# Patient Record
Sex: Male | Born: 1945
Health system: Southern US, Community
[De-identification: ages and names within clinical notes are randomized; demographics above are authoritative.]

## PROBLEM LIST (undated history)

## (undated) DIAGNOSIS — E785 Hyperlipidemia, unspecified: Secondary | ICD-10-CM

## (undated) DIAGNOSIS — Z961 Presence of intraocular lens: Secondary | ICD-10-CM

## (undated) DIAGNOSIS — H8109 Meniere's disease, unspecified ear: Secondary | ICD-10-CM

## (undated) DIAGNOSIS — I719 Aortic aneurysm of unspecified site, without rupture: Secondary | ICD-10-CM

## (undated) DIAGNOSIS — I4892 Unspecified atrial flutter: Secondary | ICD-10-CM

## (undated) DIAGNOSIS — H269 Unspecified cataract: Secondary | ICD-10-CM

## (undated) DIAGNOSIS — G4730 Sleep apnea, unspecified: Secondary | ICD-10-CM

## (undated) DIAGNOSIS — J302 Other seasonal allergic rhinitis: Secondary | ICD-10-CM

## (undated) DIAGNOSIS — C4491 Basal cell carcinoma of skin, unspecified: Secondary | ICD-10-CM

## (undated) HISTORY — PX: COLONOSCOPY: SHX174

## (undated) HISTORY — DX: Unspecified cataract: H26.9

## (undated) HISTORY — DX: Sleep apnea, unspecified: G47.30

## (undated) HISTORY — DX: Other seasonal allergic rhinitis: J30.2

## (undated) HISTORY — DX: Hyperlipidemia, unspecified: E78.5

## (undated) HISTORY — PX: TOOTH EXTRACTION: SUR596

## (undated) HISTORY — DX: Presence of intraocular lens: Z96.1

## (undated) HISTORY — DX: Unspecified atrial flutter: I48.92

## (undated) HISTORY — DX: Basal cell carcinoma of skin, unspecified: C44.91

## (undated) HISTORY — PX: CATARACT EXTRACTION: SUR2

## (undated) HISTORY — DX: Meniere's disease, unspecified ear: H81.09

## (undated) HISTORY — DX: Aortic aneurysm of unspecified site, without rupture: I71.9

## (undated) HISTORY — PX: OTHER SURGICAL HISTORY: SHX169

---

## 1988-09-04 HISTORY — PX: OTHER SURGICAL HISTORY: SHX169

## 1998-09-04 HISTORY — PX: OTHER SURGICAL HISTORY: SHX169

## 2006-04-16 ENCOUNTER — Ambulatory Visit: Payer: Self-pay | Admitting: Family Medicine

## 2006-04-24 ENCOUNTER — Ambulatory Visit: Payer: Self-pay | Admitting: Family Medicine

## 2006-04-25 ENCOUNTER — Ambulatory Visit: Payer: Self-pay | Admitting: Family Medicine

## 2006-10-22 ENCOUNTER — Ambulatory Visit: Payer: Self-pay | Admitting: Family Medicine

## 2006-10-22 LAB — CONVERTED CEMR LAB
Cholesterol: 259 mg/dL (ref 0–200)
HDL: 56.7 mg/dL (ref >39.0)
Total CHOL/HDL Ratio: 4.6
Triglycerides: 147 mg/dL (ref 0–149)
VLDL: 29 mg/dL (ref 0–40)

## 2008-01-09 ENCOUNTER — Ambulatory Visit: Payer: Self-pay | Admitting: Family Medicine

## 2008-01-09 DIAGNOSIS — E7800 Pure hypercholesterolemia, unspecified: Secondary | ICD-10-CM

## 2008-01-10 LAB — CONVERTED CEMR LAB
HDL: 50.6 mg/dL (ref >39.0)
LDL Cholesterol: 107 mg/dL — ABNORMAL HIGH (ref 0–99)
Total CHOL/HDL Ratio: 3.8
VLDL: 34 mg/dL (ref 0–40)

## 2008-04-08 ENCOUNTER — Ambulatory Visit: Payer: Self-pay | Admitting: Family Medicine

## 2008-04-08 DIAGNOSIS — J309 Allergic rhinitis, unspecified: Secondary | ICD-10-CM | POA: Insufficient documentation

## 2008-04-10 ENCOUNTER — Ambulatory Visit: Payer: Self-pay | Admitting: Cardiology

## 2008-04-10 LAB — CONVERTED CEMR LAB
ALT: 66 units/L — ABNORMAL HIGH (ref 0–53)
AST: 75 units/L — ABNORMAL HIGH (ref 0–37)
Albumin: 4.3 g/dL (ref 3.5–5.2)
Alkaline Phosphatase: 52 units/L (ref 39–117)
BUN: 13 mg/dL (ref 6–23)
Basophils Absolute: 0 10*3/uL (ref 0.0–0.1)
Basophils Relative: 0.2 % (ref 0.0–3.0)
Bilirubin, Direct: 0.1 mg/dL (ref 0.0–0.3)
CO2: 28 meq/L (ref 19–32)
Calcium: 9.1 mg/dL (ref 8.4–10.5)
Chloride: 101 meq/L (ref 96–112)
Cholesterol: 206 mg/dL (ref 0–200)
Creatinine, Ser: 0.8 mg/dL (ref 0.4–1.5)
Direct LDL: 122.2 mg/dL
Eosinophils Absolute: 0.3 10*3/uL (ref 0.0–0.7)
Eosinophils Relative: 4.1 % (ref 0.0–5.0)
GFR calc Af Amer: 127 mL/min
GFR calc non Af Amer: 105 mL/min
Glucose, Bld: 95 mg/dL (ref 70–99)
HCT: 46 % (ref 39.0–52.0)
HDL: 64.9 mg/dL (ref >39.0)
Hemoglobin: 16.2 g/dL (ref 13.0–17.0)
Lymphocytes Relative: 28.6 % (ref 12.0–46.0)
MCHC: 35.2 g/dL (ref 30.0–36.0)
MCV: 94.4 fL (ref 78.0–100.0)
Monocytes Absolute: 0.7 10*3/uL (ref 0.1–1.0)
Monocytes Relative: 8.4 % (ref 3.0–12.0)
Neutro Abs: 4.9 10*3/uL (ref 1.4–7.7)
Neutrophils Relative %: 58.7 % (ref 43.0–77.0)
PSA: 1.84 ng/mL (ref 0.10–4.00)
Platelets: 388 10*3/uL (ref 150–400)
Potassium: 4.1 meq/L (ref 3.5–5.1)
RBC: 4.87 M/uL (ref 4.22–5.81)
RDW: 12.2 % (ref 11.5–14.6)
Sodium: 139 meq/L (ref 135–145)
TSH: 0.99 microintl units/mL (ref 0.35–5.50)
Total Bilirubin: 1.1 mg/dL (ref 0.3–1.2)
Total CHOL/HDL Ratio: 3.2
Total Protein: 7 g/dL (ref 6.0–8.3)
Triglycerides: 153 mg/dL — ABNORMAL HIGH (ref 0–149)
VLDL: 31 mg/dL (ref 0–40)
WBC: 8.2 10*3/uL (ref 4.5–10.5)

## 2008-04-17 ENCOUNTER — Encounter: Payer: Self-pay | Admitting: Family Medicine

## 2008-04-17 ENCOUNTER — Ambulatory Visit: Payer: Self-pay

## 2008-04-27 ENCOUNTER — Ambulatory Visit: Payer: Self-pay | Admitting: Family Medicine

## 2008-04-29 LAB — CONVERTED CEMR LAB
Albumin: 3.9 g/dL (ref 3.5–5.2)
Alkaline Phosphatase: 37 units/L — ABNORMAL LOW (ref 39–117)
Total Bilirubin: 0.8 mg/dL (ref 0.3–1.2)

## 2008-07-01 ENCOUNTER — Ambulatory Visit: Payer: Self-pay | Admitting: Family Medicine

## 2008-07-02 LAB — CONVERTED CEMR LAB
Albumin: 3.9 g/dL (ref 3.5–5.2)
Cholesterol: 266 mg/dL (ref 0–200)
Direct LDL: 165.5 mg/dL
HDL: 54.6 mg/dL (ref >39.0)
Total Protein: 6.9 g/dL (ref 6.0–8.3)
VLDL: 30 mg/dL (ref 0–40)

## 2008-08-14 ENCOUNTER — Ambulatory Visit: Payer: Self-pay | Admitting: Family Medicine

## 2008-08-18 LAB — CONVERTED CEMR LAB
Cholesterol: 267 mg/dL (ref 0–200)
Triglycerides: 123 mg/dL (ref 0–149)

## 2008-10-29 ENCOUNTER — Ambulatory Visit: Payer: Self-pay | Admitting: Family Medicine

## 2009-10-19 ENCOUNTER — Ambulatory Visit: Payer: Self-pay | Admitting: Family Medicine

## 2009-10-25 ENCOUNTER — Encounter: Payer: Self-pay | Admitting: Family Medicine

## 2009-10-25 LAB — CONVERTED CEMR LAB
Alkaline Phosphatase: 49 units/L (ref 39–117)
Basophils Relative: 0.8 % (ref 0.0–3.0)
Bilirubin, Direct: 0.1 mg/dL (ref 0.0–0.3)
CO2: 30 meq/L (ref 19–32)
Calcium: 8.9 mg/dL (ref 8.4–10.5)
Cholesterol: 220 mg/dL — ABNORMAL HIGH (ref 0–200)
Creatinine, Ser: 0.9 mg/dL (ref 0.4–1.5)
Direct LDL: 142.7 mg/dL
Eosinophils Absolute: 0.3 10*3/uL (ref 0.0–0.7)
HDL: 53.6 mg/dL (ref >39.00)
Lymphocytes Relative: 27.8 % (ref 12.0–46.0)
MCHC: 33.1 g/dL (ref 30.0–36.0)
Neutrophils Relative %: 57.6 % (ref 43.0–77.0)
RBC: 5.24 M/uL (ref 4.22–5.81)
Total CHOL/HDL Ratio: 4
Total Protein: 6.8 g/dL (ref 6.0–8.3)
Triglycerides: 171 mg/dL — ABNORMAL HIGH (ref 0.0–149.0)
VLDL: 34.2 mg/dL (ref 0.0–40.0)
WBC: 7.1 10*3/uL (ref 4.5–10.5)

## 2009-11-30 ENCOUNTER — Ambulatory Visit: Payer: Self-pay | Admitting: Family Medicine

## 2009-12-01 LAB — CONVERTED CEMR LAB
ALT: 29 units/L (ref 0–53)
Cholesterol: 191 mg/dL (ref 0–200)
HDL: 52.5 mg/dL (ref >39.00)
Triglycerides: 108 mg/dL (ref 0.0–149.0)
VLDL: 21.6 mg/dL (ref 0.0–40.0)

## 2010-08-19 ENCOUNTER — Ambulatory Visit: Payer: Self-pay | Admitting: Family Medicine

## 2010-10-04 NOTE — Miscellaneous (Signed)
Summary: Pravachol 80mg  rx  Clinical Lists Changes  Medications: Added new medication of PRAVACHOL 80 MG TABS (PRAVASTATIN SODIUM) take one by mouth once daily Removed medication of PRAVACHOL 40 MG TABS (PRAVASTATIN SODIUM) one by mouth daily     Prior Medications: BAYER LOW STRENGTH 81 MG  TBEC (ASPIRIN) take one by mouth daily MULTIVITAMINS   TABS (MULTIPLE VITAMIN) daily OSTEO BI-FLEX JOINT SHIELD   TABS (MISC NATURAL PRODUCTS) daily FISH OIL 1000 MG  CAPS (OMEGA-3 FATTY ACIDS) daily VITAMIN C () daily CALCIUM () daily VITAMIN D 2600 IU () daily CLARITIN 10 MG () 1 by mouth once daily as needed MUCINEX DM MAXIMUM STRENGTH 60-1200 MG XR12H-TAB (DEXTROMETHORPHAN-GUAIFENESIN) OTC As directed. PRAVACHOL 80 MG TABS (PRAVASTATIN SODIUM) take one by mouth once daily Current Allergies: No known allergies

## 2010-10-04 NOTE — Assessment & Plan Note (Signed)
Summary: CPX/RBH   Vital Signs:  Patient profile:   65 year old male Height:      70 inches Weight:      196.25 pounds BMI:     28.26 Temp:     97.9 degrees F oral Pulse rate:   76 / minute Pulse rhythm:   regular BP sitting:   120 / 82  (left arm) Cuff size:   regular  Vitals Entered By: Lewanda Rife LPN (October 19, 2009 9:36 AM)  History of Present Illness: here for health mt exam and to rev chronic med problems  is getting over a bad cold-- still some drip / and dry cough   bp is good 120/82  wt is stable  is exercising - but not as much as he was / walking dogs  has a gym membership- is getting back to that eats a very healthy diet - low fat  lipids are due to check on statin ate oats and nuts yogurt -- raisins  diet - grains and veg and nuts and fruits   prostate no problems - no change in stream / does go frequently when he drinks a lot of fluids  no nocturia  sleeps very well   Td 02 shingles status -- never had it --may consider it later  had his flu shot  not interested in pneumovax yet  does not want colonoscopy  will do stool card   Allergies (verified): No Known Drug Allergies  Past History:  Past Medical History: Last updated: 04/22/2008 hyperlipidemia  allergic rhinitis labile blood pressure   cardiology-- Dr Daleen Squibb  Past Surgical History: Last updated: 04/22/2008 multiple hernia sx 2000- ER for chest pain --? 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 Eyes:  Denies blurring and eye irritation. ENT:  Complains of decreased hearing and ringing in ears;  denies sinus pressure; is musician exp to loud noise . CV:  Denies chest pain or discomfort and palpitations. Resp:  Denies cough and wheezing. GI:  Denies abdominal pain, bloody stools, change in bowel habits, and indigestion. MS:  occ low back pain- not now .  Physical Exam  General:  Well-developed,well-nourished,in no acute distress; alert,appropriate and cooperative throughout examination Head:  normocephalic, atraumatic, and no abnormalities observed.   Eyes:  vision grossly intact, pupils equal, pupils round, and pupils reactive to light.   Ears:  R ear normal and L ear normal.   Nose:  no nasal discharge.   Mouth:  pharynx pink and moist.   Neck:  supple with full rom and no masses or thyromegally, no JVD or carotid bruit  Chest Wall:  No deformities, masses, tenderness or gynecomastia noted. Lungs:  Normal respiratory effort, chest expands symmetrically. Lungs are clear to auscultation, no crackles or wheezes. Heart:  Normal rate and regular rhythm. S1 and S2 normal without gallop, murmur, click, rub or other extra sounds. Abdomen:  Bowel sounds positive,abdomen soft and non-tender without masses, organomegaly or hernias noted. no renal bruits  Rectal:  No external abnormalities noted. Normal sphincter tone. No rectal masses or tenderness. Prostate:  Prostate gland firm and  smooth, no enlargement, nodularity, tenderness, mass, asymmetry or induration. Msk:  No deformity or scoliosis noted of thoracic or lumbar spine.  no acute joint changes  Pulses:  R and L carotid,radial,femoral,dorsalis pedis and posterior tibial pulses are full and equal bilaterally Extremities:  No clubbing, cyanosis, edema, or deformity noted with normal full range of motion of all joints.   Neurologic:  sensation intact to light touch, gait normal, and DTRs symmetrical and normal.   Skin:  Intact without suspicious lesions or rashes Cervical Nodes:  No lymphadenopathy noted Inguinal Nodes:  No significant  adenopathy Psych:  normal affect, talkative and pleasant    Impression & Recommendations:  Problem # 1:  HEALTH MAINTENANCE EXAM (ICD-V70.0) Assessment Unchanged reviewed health habits including diet, exercise and skin cancer prevention reviewed health maintenance list and family history commended on good heatlh habits needs to add regular exercise  lab today Orders: Venipuncture (45409) TLB-Lipid Panel (80061-LIPID) TLB-BMP (Basic Metabolic Panel-BMET) (80048-METABOL) TLB-CBC Platelet - w/Differential (85025-CBCD) TLB-Hepatic/Liver Function Pnl (80076-HEPATIC) TLB-TSH (Thyroid Stimulating Hormone) (84443-TSH)  Problem # 2:  SPECIAL SCREENING MALIGNANT NEOPLASM OF PROSTATE (ICD-V76.44) Assessment: Comment Only DRE without change - no symptom  psa today and update Orders: Venipuncture (81191) TLB-PSA (Prostate Specific Antigen) (84153-PSA)  Problem # 3:  HYPERCHOLESTEROLEMIA (ICD-272.0) Assessment: Unchanged  control has been good with statin and diet rev low sat fat diet  lab today and update  His updated medication list for this problem includes:    Pravachol 40 Mg Tabs (Pravastatin sodium) ..... One by mouth daily  Labs Reviewed: SGOT: 27 (08/14/2008)   SGPT: 33 (08/14/2008)   HDL:65.7 (08/14/2008), 54.6 (07/01/2008)  LDL:DEL (08/14/2008), DEL (07/01/2008)  Chol:267 (08/14/2008), 266 (07/01/2008)  Trig:123 (08/14/2008), 148 (07/01/2008)  Orders: Prescription Created Electronically [phone]  Complete Medication List: 1)  Pravachol 40 Mg Tabs (Pravastatin sodium) .... One by mouth daily 2)  Bayer Low Strength 81 Mg Tbec (Aspirin) .... Take one by mouth daily 3)  Multivitamins Tabs (Multiple vitamin) .... Daily 4)  Osteo Bi-flex Joint Shield Tabs (Misc natural products) .... Daily 5)  Fish Oil 1000 Mg Caps (Omega-3 fatty acids) .... Daily 6)  Vitamin C  .... Daily 7)  Calcium  .... Daily 8)  Vitamin D 2600 Iu  .... Daily 9)  Claritin 10 Mg  .Marland KitchenMarland Kitchen. 1 by mouth once  daily as needed 10)  Mucinex Dm Maximum Strength 60-1200 Mg Xr12h-tab (Dextromethorphan-guaifenesin) .... Otc as directed.  Patient Instructions: 1)  if you are interested in a shingles vaccine in the future --- please check with your insurance about coverage and call us in the summer to schedule  2)  keep working on healthy low fat diet 3)  It is important that you exercise reguarly at least 20 minutes 5 times a week. If you develop chest pain, have severe difficulty breathing, or feel very tired, stop exercising immediately and seek medical attention.  4)  if all labs are ok -- can follow up for physical in one year  5)  please do stool card for colon cancer screening  Prescriptions: PRAVACHOL 40 MG TABS (PRAVASTATIN SODIUM) one by mouth daily  #30 x 11   Entered and Authorized by:   Judith Part MD   Signed by:   Judith Part MD on 10/19/2009   Method used:   [facility]        [facility] [phone]* (retail)       [address] [place]  [place], [place]  [address]       Ph: [phone] or [phone]       Fax: [phone]   RxID:   [id number]   Current Allergies (reviewed today): No known allergies      Influenza Immunization History:    Influenza # 1:  Fluvax 3+ (09/04/2009)

## 2010-10-06 NOTE — Assessment & Plan Note (Signed)
Summary: FLU SHOT  CYD  Nurse Visit   Allergies: No Known Drug Allergies  Orders Added: 1)  Admin 1st Vaccine [90471] 2)  Flu Vaccine 3yrs + [90658]   Flu Vaccine Consent Questions     Do you have a history of severe allergic reactions to this vaccine? no    Any prior history of allergic reactions to egg and/or gelatin? no    Do you have a sensitivity to the preservative Thimersol? no    Do you have a past history of Guillan-Barre Syndrome? no    Do you currently have an acute febrile illness? no    Have you ever had a severe reaction to latex? no    Vaccine information given and explained to patient? yes    Are you currently pregnant? no    Lot Number:[id number]   Exp Date:03/04/2011   Site Given  Left Deltoid IM 

## 2010-10-28 ENCOUNTER — Telehealth (INDEPENDENT_AMBULATORY_CARE_PROVIDER_SITE_OTHER): Payer: Self-pay | Admitting: *Deleted

## 2010-10-31 ENCOUNTER — Encounter (INDEPENDENT_AMBULATORY_CARE_PROVIDER_SITE_OTHER): Payer: Self-pay | Admitting: *Deleted

## 2010-10-31 ENCOUNTER — Other Ambulatory Visit: Payer: Self-pay | Admitting: Family Medicine

## 2010-10-31 ENCOUNTER — Other Ambulatory Visit (INDEPENDENT_AMBULATORY_CARE_PROVIDER_SITE_OTHER): Payer: PRIVATE HEALTH INSURANCE

## 2010-10-31 DIAGNOSIS — E7800 Pure hypercholesterolemia, unspecified: Secondary | ICD-10-CM

## 2010-10-31 DIAGNOSIS — Z125 Encounter for screening for malignant neoplasm of prostate: Secondary | ICD-10-CM

## 2010-10-31 DIAGNOSIS — Z0000 Encounter for general adult medical examination without abnormal findings: Secondary | ICD-10-CM

## 2010-10-31 LAB — CBC WITH DIFFERENTIAL/PLATELET
Basophils Absolute: 0 10*3/uL (ref 0.0–0.1)
HCT: 43.5 % (ref 39.0–52.0)
Hemoglobin: 15 g/dL (ref 13.0–17.0)
Lymphs Abs: 2 10*3/uL (ref 0.7–4.0)
MCV: 93.9 fl (ref 78.0–100.0)
Monocytes Relative: 8.7 % (ref 3.0–12.0)
Neutro Abs: 3.9 10*3/uL (ref 1.4–7.7)
RDW: 13.3 % (ref 11.5–14.6)

## 2010-10-31 LAB — HEPATIC FUNCTION PANEL
ALT: 38 U/L (ref 0–53)
AST: 32 U/L (ref 0–37)
Albumin: 3.9 g/dL (ref 3.5–5.2)

## 2010-10-31 LAB — TSH: TSH: 0.72 u[IU]/mL (ref 0.35–5.50)

## 2010-10-31 LAB — BASIC METABOLIC PANEL
CO2: 26 mEq/L (ref 19–32)
Chloride: 107 mEq/L (ref 96–112)
GFR: 105.43 mL/min (ref >60.00)
Glucose, Bld: 101 mg/dL — ABNORMAL HIGH (ref 70–99)
Potassium: 4.2 mEq/L (ref 3.5–5.1)
Sodium: 140 mEq/L (ref 135–145)

## 2010-10-31 LAB — LIPID PANEL: VLDL: 23.4 mg/dL (ref 0.0–40.0)

## 2010-11-01 NOTE — Progress Notes (Signed)
----   Converted from flag ---- ---- 10/27/2010 8:06 PM, Judith Part MD wrote: please check lipid/wellness and psa for v70.0 and prostate  screen  ---- 10/25/2010 12:58 PM, Liane Comber CMA (AAMA) wrote: Lab orders please! Good Morning! This pt is scheduled for cpx labs Monday, which labs to draw and dx codes to use? Thanks Tasha ------------------------------

## 2010-11-02 ENCOUNTER — Encounter (INDEPENDENT_AMBULATORY_CARE_PROVIDER_SITE_OTHER): Payer: PRIVATE HEALTH INSURANCE | Admitting: Family Medicine

## 2010-11-02 ENCOUNTER — Encounter: Payer: Self-pay | Admitting: Family Medicine

## 2010-11-02 DIAGNOSIS — Z23 Encounter for immunization: Secondary | ICD-10-CM

## 2010-11-02 DIAGNOSIS — Z0000 Encounter for general adult medical examination without abnormal findings: Secondary | ICD-10-CM

## 2010-11-02 DIAGNOSIS — E7800 Pure hypercholesterolemia, unspecified: Secondary | ICD-10-CM

## 2010-11-02 DIAGNOSIS — Z125 Encounter for screening for malignant neoplasm of prostate: Secondary | ICD-10-CM

## 2010-11-10 NOTE — Letter (Signed)
Summary: [facility]: Immunoassay Fecal Occult Blood (iFOB) Order Form  [place] [facility]  [address] [place], [place] [address]   Phone: [phone]  Fax: [phone]      [facility]: Immunoassay Fecal Occult Blood (iFOB) Order Form   November 02, 2010 MRN: [id number]   Marvin Pacheco Apr 21, 1946   Physicican Name:___________tower______________  Diagnosis Code:___________V76.51_______________      Judith Part MD

## 2010-11-10 NOTE — Assessment & Plan Note (Signed)
Summary: CPX/RBH   Vital Signs:  Patient profile:   65 year old male Height:      70.5 inches Weight:      195.50 pounds BMI:     27.75 Temp:     98.2 degrees F oral Pulse rate:   76 / minute Pulse rhythm:   regular BP sitting:   116 / 78  (left arm) Cuff size:   regular  Vitals Entered By: Lewanda Rife LPN (November 02, 2010 10:16 AM) CC: CPX   History of Present Illness: here for health mt exam and to review chronic med problems  has been feeling fine  no new medical changes    wt is down 1 lb with bmi of 27  116/78 good bp  chol is stable on statin and diet  Last Lipid ProfileCholesterol: 196 (10/31/2010 8:35:07 AM)HDL:  56.10 (10/31/2010 8:35:07 AM)LDL:  117 (10/31/2010 8:35:07 AM)Triglycerides:  Last Liver profileSGOT:  32 (10/31/2010 8:35:07 AM)SPGT:  38 (10/31/2010 8:35:07 AM)T. Bili:  1.0 (10/31/2010 8:35:07 AM)Alk Phos:  48 (10/31/2010 8:35:07 AM) diet continues to be good for the most part occ cheats - tries not to  eating a bit more sugar- then stopped that   is not exercising as much as he needs to  can make some changes -- needs to walk his    psa is 1.95- stable as well no prostate symptoms at all no nucturia   Td is due - last 1/02 found out daughter is pregnant-- will get pertussis imm other imms   does not want colonosc  will do stool card  due for Td  also wants shingles shot- he checked with is insurance    Allergies (verified): No Known Drug Allergies  Past History:  Past Medical History: Last updated: 04/22/2008 hyperlipidemia  allergic rhinitis labile blood pressure   cardiology-- Dr Daleen Squibb  Past Surgical History: Last updated: 04/22/2008 multiple hernia sx 2000- ER for chest pain --? 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 Eyes:  Denies blurring and eye irritation. CV:  Denies chest pain or discomfort, palpitations, and shortness of breath with exertion. Resp:  Denies cough, shortness of breath, and wheezing. GI:  Denies abdominal pain, change in bowel habits, indigestion, and nausea. GU:  Denies dysuria, nocturia, urinary frequency, and urinary hesitancy. MS:  Denies joint pain, muscle aches, and cramps. Derm:  Denies itching, lesion(s), poor wound healing, and rash. Neuro:  Denies numbness and tingling. Endo:  Denies cold intolerance, excessive thirst, excessive urination, and heat intolerance. Heme:  Denies abnormal bruising and bleeding.  Physical Exam  General:  Well-developed,well-nourished,in no acute distress; alert,appropriate and cooperative throughout examination Head:  normocephalic, atraumatic, and no abnormalities observed.   Eyes:  vision grossly intact, pupils equal, pupils round, and pupils reactive to light.  no conjunctival pallor, injection or icterus  Ears:  R ear normal and L ear normal.   Nose:  no nasal discharge.   Mouth:  pharynx pink and moist.   Neck:  supple with full rom and no masses or thyromegally, no JVD or carotid bruit  Chest Wall:  No deformities,  masses, tenderness or gynecomastia noted. Lungs:  Normal respiratory effort, chest expands symmetrically. Lungs are clear to auscultation, no crackles or wheezes. Heart:  Normal rate and regular rhythm. S1 and S2 normal without gallop, murmur, click, rub or other extra sounds. Abdomen:  Bowel sounds positive,abdomen soft and non-tender without masses, organomegaly or hernias noted. no renal bruits  Rectal:  No external abnormalities noted. Normal sphincter tone. No rectal masses or tenderness. Prostate:  Prostate gland firm and smooth, no  enlargement, nodularity, tenderness, mass, asymmetry or induration. Msk:  medial bunion on L foot  no other acute joint changes no kyphosis  Pulses:  R and L carotid,radial,femoral,dorsalis pedis and posterior tibial pulses are full and equal bilaterally Extremities:  No clubbing, cyanosis, edema, or deformity noted with normal full range of motion of all joints.   Neurologic:  sensation intact to light touch, gait normal, and DTRs symmetrical and normal.   Skin:  thickened toenails bilat  no rash some lentigos  Cervical Nodes:  No lymphadenopathy noted Inguinal Nodes:  No significant adenopathy Psych:  normal affect, talkative and pleasant    Impression & Recommendations:  Problem # 1:  HEALTH MAINTENANCE EXAM (ICD-V70.0) Assessment Comment Only reviewed health habits including diet, exercise and skin cancer prevention reviewed health maintenance list and family history reviewed wellness labs with pt in detail  tdap today sched shingles vaccine in 1 mo   Problem # 2:  HYPERCHOLESTEROLEMIA (ICD-272.0) Assessment: Unchanged  this is stable with max dose of pravastatin rev lab with pt  rev low sat fat diet with pt   His updated medication list for this problem includes:    Pravachol 80 Mg Tabs (Pravastatin sodium) .Marland Kitchen... Take one by mouth once daily  Labs Reviewed: SGOT: 32 (10/31/2010)   SGPT: 38 (10/31/2010)   HDL:56.10 (10/31/2010), 52.50 (11/30/2009)  LDL:117 (10/31/2010), 117 (11/30/2009)  Chol:196 (10/31/2010), 191 (11/30/2009)  Trig:117.0 (10/31/2010), 108.0 (11/30/2009)  Problem # 3:  SPECIAL SCREENING MALIGNANT NEOPLASM OF PROSTATE (ICD-V76.44) Assessment: Comment Only psa is stable  no symptoms  nonremarkable exam   Complete Medication List: 1)  Bayer Low Strength 81 Mg Tbec (Aspirin) .... Take one by mouth daily 2)  Multivitamins Tabs (Multiple vitamin) .... Daily 3)  Fish Oil 1000 Mg Caps (Omega-3 fatty acids) .... Daily 4)  Calcium ? Mg  .... Two tabs by  mouth daily 5)  Vitamin D 2600 Iu  .... Daily 6)  Claritin 10 Mg  .Marland KitchenMarland Kitchen. 1 by mouth once daily as needed 7)  Mucinex Dm Maximum Strength 60-1200 Mg Xr12h-tab (Dextromethorphan-guaifenesin) .... Otc as directed. 8)  Pravachol 80 Mg Tabs (Pravastatin sodium) .... Take one by mouth once daily 9)  Vitamin C 1000 Mg Tabs (Ascorbic acid) .... Take 1 tablet by mouth once a day 10)  Coq10 ?mg  .... Take 1 tablet by mouth once a day 11)  B Complex Tabs (B complex vitamins) .... Take 1 tablet by mouth once a day  Other Orders: Tdap => 17yrs IM (37106) Admin 1st Vaccine (26948)  Patient Instructions: 1)  you can raise your HDL (good cholesterol) by increasing exercise and eating omega 3 fatty acid supplement like fish oil or flax seed oil over the counter 2)  you can lower LDL (bad cholesterol) by limiting saturated fats in diet like red meat, fried foods, egg yolks, fatty breakfast meats, high fat dairy products and shellfish  3)  no change in medicines 4)  labs look stable  5)  tetnus shot today  6)  schedule nurse visit for shingles vaccine in 1 month Prescriptions: PRAVACHOL 80 MG TABS (PRAVASTATIN SODIUM) take one by mouth once daily  #30 x 11   Entered and Authorized by:   Judith Part MD   Signed by:   Judith Part MD on 11/02/2010   Method used:   [facility]        [facility] [phone]* (retail)       [address] [place], [place]  [address]       Ph: [phone] or [phone]       Fax: [phone]   RxID:   [id number]    Orders Added: 1)  Tdap => 65yrs IM [90715] 2)  Admin 1st Vaccine [90471] 3)  Est. Patient 40-64 years [99396]   Immunizations Administered:  Tetanus Vaccine:    Vaccine Type: Tdap    Site: left deltoid    Mfr: GlaxoSmithKline    Dose: 0.5 ml    Route: IM    Given by: Lewanda Rife LPN    Exp. Date: 06/23/2012    Lot #: [id number]    VIS given: 07/22/08 version given November 02, 2010.   Immunizations  Administered:  Tetanus Vaccine:    Vaccine Type: Tdap    Site: left deltoid    Mfr: GlaxoSmithKline    Dose: 0.5 ml    Route: IM    Given by: Lewanda Rife LPN    Exp. Date: 06/23/2012    Lot #: [id number]    VIS given: 07/22/08 version given November 02, 2010.  Current Allergies (reviewed today): No known allergies

## 2010-11-18 ENCOUNTER — Other Ambulatory Visit: Payer: Self-pay | Admitting: Family Medicine

## 2010-11-18 ENCOUNTER — Other Ambulatory Visit: Payer: PRIVATE HEALTH INSURANCE

## 2010-11-18 ENCOUNTER — Encounter (INDEPENDENT_AMBULATORY_CARE_PROVIDER_SITE_OTHER): Payer: Self-pay | Admitting: *Deleted

## 2010-11-18 DIAGNOSIS — Z1211 Encounter for screening for malignant neoplasm of colon: Secondary | ICD-10-CM

## 2010-11-21 ENCOUNTER — Encounter: Payer: Self-pay | Admitting: Family Medicine

## 2010-11-21 LAB — FECAL OCCULT BLOOD, IMMUNOCHEMICAL: Fecal Occult Bld: NEGATIVE

## 2010-12-01 NOTE — Letter (Signed)
Summary: Results Follow up Letter   at [facility]  [address] [place], [place] [address]   Phone: [phone]  Fax: [phone]    11/21/2010 MRN: [id number]  Marvin Pacheco [address] [place], [place]  [address]  [place]    Dear Mr. BUMGARNER,    The following are the results of your recent test(s):  Test         Result    Pap Smear:        Normal _____  Not Normal _____ Comments: ______________________________________________________ Cholesterol: LDL(Bad cholesterol):         Your goal is less than:         HDL (Good cholesterol):       Your goal is more than: Comments:  ______________________________________________________ Mammogram:        Normal _____  Not Normal _____ Comments:  ___________________________________________________________________ Hemoccult:        Normal _____  Not normal _______ Comments:    _____________________________________________________________________ Other Tests:Stool card was negative.    We routinely do not discuss normal results over the telephone.  If you desire a copy of the results, or you have any questions about this information we can discuss them at your next office visit.   Sincerely,    Idamae Schuller Tower,MD  MT/ri

## 2010-12-02 ENCOUNTER — Ambulatory Visit (INDEPENDENT_AMBULATORY_CARE_PROVIDER_SITE_OTHER): Payer: PRIVATE HEALTH INSURANCE | Admitting: Family Medicine

## 2010-12-02 DIAGNOSIS — Z2911 Encounter for prophylactic immunotherapy for respiratory syncytial virus (RSV): Secondary | ICD-10-CM

## 2010-12-02 DIAGNOSIS — Z23 Encounter for immunization: Secondary | ICD-10-CM

## 2010-12-08 ENCOUNTER — Telehealth: Payer: Self-pay

## 2010-12-08 NOTE — Telephone Encounter (Signed)
Pt called back and he is already employed by the [employer], but ownership is changing to [employer] and pt does not think that he will have to have TB skin test for this form. Pt asked to put N/A on the part of the form for the free of communicable disease and if after pt turns form in he is told he has to have TB skin test then he will schedule at nurse visit. Pt said he would like to pick up form on Mon if possible.

## 2010-12-08 NOTE — Telephone Encounter (Signed)
Pt brought by [employer] health exam certificate to be completed. I called pt to see if he had a TB skin test or chest xray recently but left v/m for pt to call back.  Form is on Dr Royden Purl shelf in the in box.

## 2010-12-08 NOTE — Telephone Encounter (Signed)
Pt brought [employer], health exam certificate to be completed by Dr Milinda Antis.

## 2010-12-08 NOTE — Telephone Encounter (Signed)
Please disregard this phone note already a note started. Sorry Marvin Pacheco

## 2010-12-12 NOTE — Telephone Encounter (Signed)
Form done and in nurse IN box

## 2010-12-13 NOTE — Telephone Encounter (Signed)
Lyla Son contacted pt and pt will pick up form at front desk.

## 2011-01-17 NOTE — Assessment & Plan Note (Signed)
[facility]                            CARDIOLOGY OFFICE NOTE   Marvin, Pacheco                        MRN:          [id number]  DATE:04/10/2008                            DOB:          1946-08-12    CHIEF COMPLAINT:  Aching arm with exertion and shortness of breath.   [name] is a delightful 65 year old married white male, father  of 3, is referred by Dr. Roxy Manns because of symptoms as described  above of recent onset.  He has multiple cardiac risk factors including  age, remote tobacco, hyperlipidemia on simvastatin, very strong family  history with his mother dying of a heart attack at age 14, half sister  dying of having a heart attack at a young age.   He works out sporadically.  He has had some dyspnea on exertion.  He has  had no exertional chest tightness or angina per se.   PAST MEDICAL HISTORY:  He has no history of dye allergy.  He is not  allergic to any medications.   His current meds are simvastatin 40 mg a day, Flonase 50 mcg a day,  enteric-coated aspirin 81 mg a day, multivitamin, Osteo Bi-Flex, fish  oil, calcium, B complex, vitamin D, vitamin E, and Claritin D daily.   He drinks two glasses of wine a day.  He quit smoking in 1971.   His surgical history, he has had a double hernia repair in 1990,  subsequent hernia repair in 1998 and 1999.   Family history is as above.   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  He  is married and has 3 children.   REVIEW OF SYSTEMS:  Other than some seasonal allergies, all his review  of systems x12 points of care are negative.  See the HPI for the  positives.   PHYSICAL EXAMINATION:  His blood pressure is 108/82.  His pulse is 71.  His EKG is normal.  His height is 6 feet.  His weight is 203.  HEENT,  normocephalic, atraumatic.  PERRLA.  Extraocular movements intact.  Sclerae are clear.  Facial symmetry is normal.  Carotids upstrokes are  equal bilaterally without bruits.  No JVD.  Thyroid is not enlarged.  Trachea is midline.  Lungs are clear.  Heart reveals a nondisplaced PMI.  Normal S1 and S2.  No gallop.  Abdominal exam is soft.  Good bowel  sounds.  No midline bruit.  No hepatomegaly.  Extremities reveal no  cyanosis, clubbing, or edema.  Pulses are present.  Neuro exam is  intact.  Skin is unremarkable.   ASSESSMENT:  Dyspnea on exertion with left arm numbness and tingling of  recent onset.  He has multiple cardiac risk factors as outlined above.  We need to rule out obstructive coronary artery disease.   PLAN:  Exercise rest stress Myoview.     Thomas C. Daleen Squibb, MD, [facility]  Electronically Signed    TCW/MedQ  DD: 04/10/2008  DT: 04/11/2008  Job #: [id number]   cc:   Marne A. Milinda Antis, MD

## 2011-07-20 ENCOUNTER — Ambulatory Visit (INDEPENDENT_AMBULATORY_CARE_PROVIDER_SITE_OTHER): Payer: BC Managed Care – PPO

## 2011-07-20 DIAGNOSIS — Z23 Encounter for immunization: Secondary | ICD-10-CM

## 2011-09-08 ENCOUNTER — Telehealth: Payer: Self-pay | Admitting: Family Medicine

## 2011-09-08 NOTE — Telephone Encounter (Signed)
Spoke with pt and he has already scheduled appt at [facility]. If pt condition worsens or does not improve after being seen sat clinic pt will call next week and update Dr Milinda Antis.

## 2011-09-08 NOTE — Telephone Encounter (Signed)
Thanks- aware 

## 2011-09-08 NOTE — Telephone Encounter (Signed)
Called Rena back to get prescription for congestion/cough.  Asking for Dr. Milinda Antis to call in z-pack.  [facility] [place]  [phone].  Please call him to explain physician will need to see patient to prescribe medication unless recently seen and schedule an appointment.  I have left a voice mail explaining this to patient and offered Saturday clinic.

## 2011-09-09 ENCOUNTER — Encounter: Payer: Self-pay | Admitting: Family Medicine

## 2011-09-09 ENCOUNTER — Ambulatory Visit (INDEPENDENT_AMBULATORY_CARE_PROVIDER_SITE_OTHER): Payer: BC Managed Care – PPO | Admitting: Family Medicine

## 2011-09-09 VITALS — BP 120/84 | HR 73 | Temp 99.4°F | Ht 72.0 in | Wt 203.0 lb

## 2011-09-09 DIAGNOSIS — J329 Chronic sinusitis, unspecified: Secondary | ICD-10-CM

## 2011-09-09 MED ORDER — AMOXICILLIN-POT CLAVULANATE 875-125 MG PO TABS: 1.0000 | ORAL_TABLET | Freq: Two times a day (BID) | ORAL | Status: AC

## 2011-09-09 NOTE — Patient Instructions (Signed)

## 2011-09-09 NOTE — Progress Notes (Signed)
  Subjective:    Patient ID: Marvin Pacheco, male    DOB: 05/11/1946, 66 y.o.   MRN: [id number]  HPI  Patient seen Saturday clinic as acute visit. Over 2 week history of sinusitis symptoms. Has had sinus congestion and productive cough. Question low-grade fever off-and-on. Postnasal drip with brownish mucous. No sore throat. Intermittent laryngitis. Symptoms somewhat progressive and not improved with over-the-counter decongestants. Nonsmoker.  Minimal headache. Denies nausea, vomiting, or diarrhea.   Review of Systems As per history of present illness    Objective:   Physical Exam  Constitutional: He appears well-developed and well-nourished.  HENT:  Right Ear: External ear normal.  Left Ear: External ear normal.  Mouth/Throat: Oropharynx is clear and moist.  Neck: Neck supple.  Cardiovascular: Normal rate and regular rhythm.   Pulmonary/Chest: Effort normal and breath sounds normal. No respiratory distress. He has no wheezes. He has no rales.  Lymphadenopathy:    He has no cervical adenopathy.          Assessment & Plan:  Progressive sinusitis symptoms over 2 week duration. Start Augmentin 875 mg twice daily for 10 days. Followup with primary not improving over the next week

## 2011-10-03 ENCOUNTER — Telehealth: Payer: Self-pay | Admitting: Family Medicine

## 2011-10-03 DIAGNOSIS — Z0000 Encounter for general adult medical examination without abnormal findings: Secondary | ICD-10-CM

## 2011-10-03 DIAGNOSIS — Z125 Encounter for screening for malignant neoplasm of prostate: Secondary | ICD-10-CM | POA: Insufficient documentation

## 2011-10-03 NOTE — Telephone Encounter (Signed)
Message copied by [name] on Tue Oct 03, 2011  8:41 AM ------      Message from: Alvina Chou      Created: Tue Sep 26, 2011  3:59 PM      Regarding: labs for 10-04-11       Patient is scheduled for CPX labs, please order future labs, Thanks , Camelia Eng

## 2011-10-04 ENCOUNTER — Other Ambulatory Visit: Payer: PRIVATE HEALTH INSURANCE

## 2011-10-10 ENCOUNTER — Encounter: Payer: PRIVATE HEALTH INSURANCE | Admitting: Family Medicine

## 2011-11-19 ENCOUNTER — Emergency Department: Payer: Self-pay | Admitting: Internal Medicine

## 2011-11-19 LAB — CBC
HGB: 16 g/dL (ref 13.0–18.0)
MCH: 30.6 pg (ref 26.0–34.0)
MCHC: 33.1 g/dL (ref 32.0–36.0)
MCV: 93 fL (ref 80–100)
RBC: 5.23 10*6/uL (ref 4.40–5.90)
WBC: 8.6 10*3/uL (ref 3.8–10.6)

## 2011-11-19 LAB — COMPREHENSIVE METABOLIC PANEL
Albumin: 4.1 g/dL (ref 3.4–5.0)
Alkaline Phosphatase: 51 U/L (ref 50–136)
Bilirubin,Total: 0.5 mg/dL (ref 0.2–1.0)
Calcium, Total: 8.8 mg/dL (ref 8.5–10.1)
Co2: 24 mmol/L (ref 21–32)
Creatinine: 0.84 mg/dL (ref 0.60–1.30)
EGFR (Non-African Amer.): >60
SGOT(AST): 27 U/L (ref 15–37)
SGPT (ALT): 38 U/L
Sodium: 138 mmol/L (ref 136–145)

## 2011-11-19 LAB — TROPONIN I: Troponin-I: <0.02 ng/mL

## 2011-11-27 ENCOUNTER — Other Ambulatory Visit: Payer: Self-pay | Admitting: Family Medicine

## 2011-12-12 ENCOUNTER — Telehealth: Payer: Self-pay | Admitting: Family Medicine

## 2011-12-12 NOTE — Telephone Encounter (Signed)
Pt is calling about needing a CPE. He says he would like it as soon as possible. He is having Dizzy spells and problems with his ears. He was also put in the hospital around [facility]. [place] for heart flutters. He wanted to disucss this with you as well.  I can put him in for an acute visit and then a CPE later or do it all in one.   Best number Cell phone: [phone]

## 2011-12-12 NOTE — Telephone Encounter (Signed)
Since a PE is for health mt -- I do not want (or have time to ) address problems at that visit , so please schedule an acute appt for the problems he is having - we will address those and then plan a health mt exam after (I will take care of that)  Thanks

## 2011-12-12 NOTE — Telephone Encounter (Signed)
Patient advised as instructed via telephone, acute appt scheduled for 12/13/2011 at 10:15.

## 2011-12-15 ENCOUNTER — Encounter: Payer: Self-pay | Admitting: Family Medicine

## 2011-12-15 ENCOUNTER — Ambulatory Visit (INDEPENDENT_AMBULATORY_CARE_PROVIDER_SITE_OTHER): Payer: BC Managed Care – PPO | Admitting: Family Medicine

## 2011-12-15 VITALS — BP 110/80 | HR 67 | Temp 98.0°F | Ht 72.0 in | Wt 187.2 lb

## 2011-12-15 DIAGNOSIS — I4892 Unspecified atrial flutter: Secondary | ICD-10-CM

## 2011-12-15 DIAGNOSIS — R42 Dizziness and giddiness: Secondary | ICD-10-CM

## 2011-12-15 NOTE — Progress Notes (Signed)
Subjective:    Patient ID: Marvin Pacheco, male    DOB: 1945-12-12, 66 y.o.   MRN: [id number]  HPI 4 weeks ago on Sunday- had a pressure sensation in ears and also muffled sound  Thought perhaps sinus or inner ear problem Turned head in the car and got severe spinning sensation and pulled over   Then could not walk well  Nausea/ sweat and then violently vomited -- when he did his L eye -- had brilliant white light  Then it turned into shapes and flashes  Happened 3 times with 3 episodes of emesis - then went dark like a curtain coming down  Went to [facility] and went to [place] Had atrial flutter/ inc bp , did Ct and work up   Decided to go to [facility] -- 3/17 to 3//20 Heart converted back to regular rhythm  Started to feel better and dizziness got better  Did nuclear stress test-came back perfect  Carotid doppler - was clear  MRI / MRA -- very good  Did find aorta that was slt dilated - incidental finding   Released him on metoprolol and holter monitor (2 weeks)  Talked to cardiol today -- said few instances when HR jumped and just for a second - not worrisome  One instance - ? disconcordance - made appt to see him in 2 weeks   Only one more dizzy spell at the first of this week -- had bad diarrhea   Last week opthy and ear appts   opthy- ? Poss opth migraine   BPV- put him on prednisone  (neuro agrees) Felt very good on the prednisone -- then worse again off of it  Also flonase  See ENT in 2 weeks   Thinks metoprolol is making him fatigued  Called cardiol -told to stop the metoprolol , and changed to cardizem   Patient Active Problem List  Diagnoses  . HYPERCHOLESTEROLEMIA  . ALLERGIC RHINITIS  . Routine general medical examination at a health care facility  . Prostate cancer screening  . Vertigo  . Atrial flutter   No past medical history on file. No past surgical history on file. History  Substance Use Topics  . Smoking status: Never Smoker   . Smokeless  tobacco: Not on file  . Alcohol Use: Not on file   No family history on file. No Known Allergies Current Outpatient Prescriptions on File Prior to Visit  Medication Sig Dispense Refill  . diltiazem (CARDIZEM CD) 120 MG 24 hr capsule Take 120 mg by mouth daily.      . pravastatin (PRAVACHOL) 80 MG tablet TAKE ONE BY MOUTH ONCE DAILY  30 tablet  1            Review of Systems Review of Systems  Constitutional: Negative for fever, appetite change, and unexpected weight change. pos for fatigue  Eyes: Negative for pain and visual disturbance. (now) ENT pof for sens of ear fullness without pain/ also vertigo Respiratory: Negative for cough and shortness of breath.   Cardiovascular: Negative for cp or palpitations    Gastrointestinal: Negative for, diarrhea and constipation.no nausea unless dizzy  Genitourinary: Negative for urgency and frequency.  Skin: Negative for pallor or rash   Neurological: Negative for weakness, light-headedness, numbness and no headache currently Hematological: Negative for adenopathy. Does not bruise/bleed easily.  Psychiatric/Behavioral: Negative for dysphoric mood. The patient is not nervous/anxious.          Objective:   Physical Exam  Constitutional: He appears  well-developed and well-nourished. No distress.  HENT:  Head: Normocephalic and atraumatic.  Right Ear: External ear normal.  Left Ear: External ear normal.  Mouth/Throat: Oropharynx is clear and moist. No oropharyngeal exudate.       Nares are boggy No sinus tenderness TMs are clear   Eyes: Conjunctivae and EOM are normal. Pupils are equal, round, and reactive to light. Right eye exhibits no discharge. Left eye exhibits no discharge. No scleral icterus.       2-3 beats of horiz nystagmus bilaterally   Neck: Normal range of motion. Neck supple. No JVD present. Carotid bruit is not present. No thyromegaly present.  Cardiovascular: Normal rate, regular rhythm, normal heart sounds and  intact distal pulses.  Exam reveals no gallop.   No murmur heard. Pulmonary/Chest: Effort normal and breath sounds normal. No respiratory distress. He has no wheezes.  Abdominal: Soft. Bowel sounds are normal. He exhibits no distension and no abdominal bruit. There is no tenderness.  Musculoskeletal: He exhibits no edema.  Lymphadenopathy:    He has no cervical adenopathy.  Neurological: He is alert. He has normal strength and normal reflexes. He displays no atrophy and no tremor. No cranial nerve deficit or sensory deficit. He exhibits normal muscle tone. Coordination and gait normal.  Skin: Skin is warm and dry. No rash noted. No erythema. No pallor.  Psychiatric: He has a normal mood and affect.          Assessment & Plan:

## 2011-12-15 NOTE — Patient Instructions (Signed)
Avoid sudden changes in position  Advance activity slowly  Follow up with ENT as planned  Continue current medicines including flonase and aspirin  Change cardiac medicines as planned and follow up as planned  I'm glad you are feeling better If worse let us know

## 2011-12-17 NOTE — Assessment & Plan Note (Signed)
With severe vomiting that caused a flutter and poss tia  Also may be some component of atypical migraine  Overall improved Seeing ENT - and will likely do some PT  Re assuring exam today and feeling much better Avail hosp records and studies reviewed today

## 2011-12-17 NOTE — Assessment & Plan Note (Signed)
This occurred after several episodes of severe vomiting and then did not return  Re assuring holter per pt  Will have cardiol f/u

## 2012-01-04 ENCOUNTER — Telehealth: Payer: Self-pay | Admitting: Family Medicine

## 2012-01-04 NOTE — Telephone Encounter (Signed)
I don't see any problems with that - just need to watch bp to make sure it does not get too low ... Thanks for the heads up , keep me posted, and I hope it helps

## 2012-01-04 NOTE — Telephone Encounter (Signed)
Dr.Vaught wants to start the patient on Dyazide.  He has been diagnosed with Menieres Disease.  Patient was sent for lab work today to check his kidney function.  Dr.Vaught wanted to make sure that it's all right with you for patient to start medication.

## 2012-01-05 NOTE — Telephone Encounter (Signed)
Kendal Hymen advised as instructed via message left on voicemail at work.

## 2012-01-15 ENCOUNTER — Encounter: Payer: Self-pay | Admitting: Family Medicine

## 2012-01-15 ENCOUNTER — Ambulatory Visit (INDEPENDENT_AMBULATORY_CARE_PROVIDER_SITE_OTHER): Payer: BC Managed Care – PPO | Admitting: Family Medicine

## 2012-01-15 VITALS — BP 120/74 | HR 60 | Temp 98.0°F | Ht 72.0 in | Wt 189.2 lb

## 2012-01-15 DIAGNOSIS — T50905A Adverse effect of unspecified drugs, medicaments and biological substances, initial encounter: Secondary | ICD-10-CM | POA: Insufficient documentation

## 2012-01-15 DIAGNOSIS — T887XXA Unspecified adverse effect of drug or medicament, initial encounter: Secondary | ICD-10-CM

## 2012-01-15 DIAGNOSIS — H8109 Meniere's disease, unspecified ear: Secondary | ICD-10-CM | POA: Insufficient documentation

## 2012-01-15 DIAGNOSIS — R894 Abnormal immunological findings in specimens from other organs, systems and tissues: Secondary | ICD-10-CM

## 2012-01-15 DIAGNOSIS — R42 Dizziness and giddiness: Secondary | ICD-10-CM

## 2012-01-15 DIAGNOSIS — R768 Other specified abnormal immunological findings in serum: Secondary | ICD-10-CM | POA: Insufficient documentation

## 2012-01-15 LAB — RENAL FUNCTION PANEL
BUN: 17 mg/dL (ref 6–23)
CO2: 26 mEq/L (ref 19–32)
Chloride: 92 mEq/L — ABNORMAL LOW (ref 96–112)
GFR: 99.2 mL/min (ref >60.00)
Potassium: 3.5 mEq/L (ref 3.5–5.1)

## 2012-01-15 NOTE — Assessment & Plan Note (Signed)
Suspect this is not significant but need titer Re check this  No s/s of autoimmune joint dz or SLE

## 2012-01-15 NOTE — Patient Instructions (Addendum)
Schedule fasting labs for 3 months for lipid profile Avoid red meat/ fried foods/ egg yolks/ fatty breakfast meats/ butter, cheese and high fat dairy/ and shellfish   Lab today- will update you  I hope you continue to feel better  I need to know what dose of dyazide you are on

## 2012-01-15 NOTE — Assessment & Plan Note (Signed)
Is imp with dyazide from Dr Andee Poles  Feeling better Continue to follow

## 2012-01-15 NOTE — Progress Notes (Signed)
Subjective:    Patient ID: Marvin Pacheco, male    DOB: 01/23/1946, 66 y.o.   MRN: [id number]  HPI Here for f/u  Was seen by ENT Dr Andee Poles for dizziness Was dx with meniere's dz  Has had lots of tests  Also audiology tests and ENT - and one ear is worse than the other  Also some hearing loss - improved with last test   Only one more incident since last visit and was mild   Was told to cut out coffee and alcohol and chocolate - almost alltogether    Was going to put him on diazide  Started just about a week ago  No side effects or problems -- and it has helped   Did lab panel   Wbc 12.7  - high due to steroids  Chol high with LDL150s (up from 117) Was good when he was in the hospital  Had eaten a lot of eggs - the week before -- has cut down since then   ANA positive  No sore or swollen joints or fever/ rash  Will follow up with cardiology soon for rest of the evaluation   Patient Active Problem List  Diagnoses  . HYPERCHOLESTEROLEMIA  . ALLERGIC RHINITIS  . Routine general medical examination at a health care facility  . Prostate cancer screening  . Vertigo  . Atrial flutter  . Meniere disease  . ANA positive  . Adverse effects of medication   Past Medical History  Diagnosis Date  . Meniere disease    No past surgical history on file. History  Substance Use Topics  . Smoking status: Never Smoker   . Smokeless tobacco: Not on file  . Alcohol Use: Not on file   No family history on file. No Known Allergies Current Outpatient Prescriptions on File Prior to Visit  Medication Sig Dispense Refill  . aspirin 81 MG chewable tablet Chew 81 mg by mouth daily.      Marland Kitchen diltiazem (CARDIZEM CD) 120 MG 24 hr capsule Take 120 mg by mouth daily.      . pravastatin (PRAVACHOL) 80 MG tablet TAKE ONE BY MOUTH ONCE DAILY  30 tablet  1      Review of Systems Review of Systems  Constitutional: Negative for fever, appetite change, unexpected weight change. pos for some  intermittent fatigue  Eyes: Negative for pain and visual disturbance.  ENT neg for ear discomfort or hearing loss  Respiratory: Negative for cough and shortness of breath.   Cardiovascular: Negative for cp or palpitations    Gastrointestinal: Negative for nausea, diarrhea and constipation.  Genitourinary: Negative for urgency and frequency.  Skin: Negative for pallor or rash   Neurological: Negative for weakness, light-headedness, numbness and headaches. pos for intermittent vertigo that is better  Hematological: Negative for adenopathy. Does not bruise/bleed easily.  Psychiatric/Behavioral: Negative for dysphoric mood. The patient is not nervous/anxious.         Objective:   Physical Exam  Constitutional: He appears well-developed and well-nourished. No distress.  HENT:  Head: Normocephalic and atraumatic.  Right Ear: External ear normal.  Left Ear: External ear normal.  Nose: Nose normal.  Mouth/Throat: Oropharynx is clear and moist.  Eyes: Conjunctivae and EOM are normal. Pupils are equal, round, and reactive to light. Right eye exhibits no discharge. Left eye exhibits no discharge. No scleral icterus.       No nystagmus   Neck: Normal range of motion. Neck supple. No JVD present. Carotid bruit is  not present. No thyromegaly present.  Cardiovascular: Normal rate, regular rhythm, normal heart sounds and intact distal pulses.  Exam reveals no gallop.   Pulmonary/Chest: Effort normal and breath sounds normal. No respiratory distress.  Abdominal: Soft. Bowel sounds are normal. He exhibits no distension, no abdominal bruit and no mass. There is no tenderness.  Musculoskeletal: He exhibits no edema and no tenderness.  Lymphadenopathy:    He has no cervical adenopathy.  Neurological: He is alert. He has normal reflexes. He displays no tremor. No cranial nerve deficit. He exhibits normal muscle tone. Coordination and gait normal.  Skin: Skin is warm and dry. No rash noted. No erythema. No  pallor.  Psychiatric: He has a normal mood and affect.          Assessment & Plan:

## 2012-01-15 NOTE — Assessment & Plan Note (Signed)
Newly on dyazide for menieres Will call us with dose  Lab today- renal panel

## 2012-01-16 LAB — ANA: Anti Nuclear Antibody(ANA): NEGATIVE

## 2012-01-23 ENCOUNTER — Ambulatory Visit: Payer: BC Managed Care – PPO | Admitting: Family Medicine

## 2012-01-30 ENCOUNTER — Other Ambulatory Visit: Payer: Self-pay | Admitting: Family Medicine

## 2012-01-30 DIAGNOSIS — Z79899 Other long term (current) drug therapy: Secondary | ICD-10-CM

## 2012-01-31 ENCOUNTER — Other Ambulatory Visit: Payer: BC Managed Care – PPO

## 2012-02-01 ENCOUNTER — Other Ambulatory Visit (INDEPENDENT_AMBULATORY_CARE_PROVIDER_SITE_OTHER): Payer: BC Managed Care – PPO

## 2012-02-01 DIAGNOSIS — Z79899 Other long term (current) drug therapy: Secondary | ICD-10-CM

## 2012-02-01 LAB — BASIC METABOLIC PANEL
BUN: 15 mg/dL (ref 6–23)
Chloride: 103 mEq/L (ref 96–112)
Potassium: 3.8 mEq/L (ref 3.5–5.1)
Sodium: 140 mEq/L (ref 135–145)

## 2012-02-02 ENCOUNTER — Encounter: Payer: Self-pay | Admitting: *Deleted

## 2012-02-03 ENCOUNTER — Other Ambulatory Visit: Payer: Self-pay | Admitting: Family Medicine

## 2012-02-05 NOTE — Telephone Encounter (Signed)
Received refill request electronically from pharmacy. Patient has a lab appointment schedule for 05/15/12. Is it okay to refill medication?

## 2012-02-05 NOTE — Telephone Encounter (Signed)
Will refill electronically  

## 2012-03-31 ENCOUNTER — Emergency Department: Payer: Self-pay | Admitting: Emergency Medicine

## 2012-04-02 ENCOUNTER — Encounter: Payer: Self-pay | Admitting: Emergency Medicine

## 2012-04-04 ENCOUNTER — Encounter: Payer: Self-pay | Admitting: Emergency Medicine

## 2012-04-08 ENCOUNTER — Other Ambulatory Visit (INDEPENDENT_AMBULATORY_CARE_PROVIDER_SITE_OTHER): Payer: 59

## 2012-04-08 DIAGNOSIS — Z0000 Encounter for general adult medical examination without abnormal findings: Secondary | ICD-10-CM

## 2012-04-08 DIAGNOSIS — Z125 Encounter for screening for malignant neoplasm of prostate: Secondary | ICD-10-CM

## 2012-04-08 LAB — COMPREHENSIVE METABOLIC PANEL
Alkaline Phosphatase: 51 U/L (ref 39–117)
BUN: 19 mg/dL (ref 6–23)
CO2: 29 mEq/L (ref 19–32)
GFR: 108.09 mL/min (ref >60.00)
Glucose, Bld: 101 mg/dL — ABNORMAL HIGH (ref 70–99)
Total Bilirubin: 0.3 mg/dL (ref 0.3–1.2)

## 2012-04-08 LAB — LIPID PANEL
Cholesterol: 205 mg/dL — ABNORMAL HIGH (ref 0–200)
Total CHOL/HDL Ratio: 4
Triglycerides: 261 mg/dL — ABNORMAL HIGH (ref 0.0–149.0)
VLDL: 52.2 mg/dL — ABNORMAL HIGH (ref 0.0–40.0)

## 2012-04-08 LAB — CBC WITH DIFFERENTIAL/PLATELET
Basophils Relative: 0.6 % (ref 0.0–3.0)
Eosinophils Relative: 2.4 % (ref 0.0–5.0)
HCT: 47 % (ref 39.0–52.0)
Hemoglobin: 15.6 g/dL (ref 13.0–17.0)
Lymphs Abs: 1.9 10*3/uL (ref 0.7–4.0)
MCV: 96 fl (ref 78.0–100.0)
Monocytes Absolute: 0.6 10*3/uL (ref 0.1–1.0)
Monocytes Relative: 8.7 % (ref 3.0–12.0)
Neutro Abs: 4.7 10*3/uL (ref 1.4–7.7)
RBC: 4.9 Mil/uL (ref 4.22–5.81)
WBC: 7.4 10*3/uL (ref 4.5–10.5)

## 2012-04-08 LAB — LDL CHOLESTEROL, DIRECT: Direct LDL: 106.2 mg/dL

## 2012-05-05 ENCOUNTER — Encounter: Payer: Self-pay | Admitting: Emergency Medicine

## 2012-05-13 ENCOUNTER — Telehealth: Payer: Self-pay | Admitting: Family Medicine

## 2012-05-13 DIAGNOSIS — Z125 Encounter for screening for malignant neoplasm of prostate: Secondary | ICD-10-CM

## 2012-05-13 DIAGNOSIS — E7800 Pure hypercholesterolemia, unspecified: Secondary | ICD-10-CM

## 2012-05-13 DIAGNOSIS — Z0000 Encounter for general adult medical examination without abnormal findings: Secondary | ICD-10-CM

## 2012-05-13 NOTE — Telephone Encounter (Signed)
I started to order labs- but then checked and it looks like he had all the labs last month?

## 2012-05-13 NOTE — Telephone Encounter (Signed)
Message copied by [name] on Mon May 13, 2012  6:01 PM ------      Message from: Alvina Chou      Created: Mon May 13, 2012  3:37 PM      Regarding: Lab orders for, 9.10.13       Patient is scheduled for CPX labs, please order future labs, Thanks , Camelia Eng

## 2012-05-14 ENCOUNTER — Telehealth: Payer: Self-pay | Admitting: Family Medicine

## 2012-05-14 NOTE — Telephone Encounter (Signed)
Message copied by [name] on Tue May 14, 2012  9:17 PM ------      Message from: Baldomero Lamy      Created: Wed May 01, 2012  8:27 AM      Regarding: Cpx labs Wed 9/11       Please order  future cpx labs for pt's upcomming lab appt.      Thanks      Rodney Booze

## 2012-05-15 ENCOUNTER — Other Ambulatory Visit: Payer: BC Managed Care – PPO

## 2012-05-22 ENCOUNTER — Ambulatory Visit (INDEPENDENT_AMBULATORY_CARE_PROVIDER_SITE_OTHER): Payer: 59 | Admitting: Family Medicine

## 2012-05-22 ENCOUNTER — Encounter: Payer: Self-pay | Admitting: Family Medicine

## 2012-05-22 VITALS — BP 130/84 | HR 82 | Temp 98.3°F | Ht 70.0 in | Wt 188.5 lb

## 2012-05-22 DIAGNOSIS — Z125 Encounter for screening for malignant neoplasm of prostate: Secondary | ICD-10-CM

## 2012-05-22 DIAGNOSIS — Z23 Encounter for immunization: Secondary | ICD-10-CM

## 2012-05-22 DIAGNOSIS — Z0000 Encounter for general adult medical examination without abnormal findings: Secondary | ICD-10-CM

## 2012-05-22 DIAGNOSIS — Z1211 Encounter for screening for malignant neoplasm of colon: Secondary | ICD-10-CM

## 2012-05-22 NOTE — Assessment & Plan Note (Signed)
Ref for first screening colonoscopy  No bowel changes

## 2012-05-22 NOTE — Assessment & Plan Note (Signed)
Stable psa and DRE No symptoms  Reassuring

## 2012-05-22 NOTE — Progress Notes (Signed)
Subjective:    Patient ID: Marvin Pacheco, male    DOB: [birth year], 66 y.o.   MRN: [id number]  HPI Here for health maintenance exam and to review chronic medical problems    Did well over the summer with meniere's dz  A little dizzy on Sunday-- thinks it is because he had some extra coffee/ and pizza (salty) Used flonase and rested and got better   Stress could have caused some problems- and this is much improved   More sedentary than usual- but now able to get going with exercise and motivated to do that  A lot more busy with work   [employer] is stable with bmi of 27  Flu shot- is due/ will get that   Colon cancer screening- has not had a colonoscopy yet  Is ready to get referral for that    Prostate cancer screening Lab Results  Component Value Date   PSA 2.17 04/08/2012   PSA 1.95 10/31/2010   PSA 2.08 10/19/2009   symptoms No problems -no problems with flow or stream As a rule, no nocturia   Meniere's dz-overall better   Lab Results  Component Value Date   CHOL 205* 04/08/2012   CHOL 196 10/31/2010   CHOL 191 11/30/2009   Lab Results  Component Value Date   HDL 50.10 04/08/2012   HDL 56.10 10/31/2010   HDL 14.78 11/30/2009   Lab Results  Component Value Date   LDLCALC 117* 10/31/2010   LDLCALC 117* 11/30/2009   LDLCALC 107* 01/09/2008   Lab Results  Component Value Date   TRIG 261.0* 04/08/2012   TRIG 117.0 10/31/2010   TRIG 108.0 11/30/2009   Lab Results  Component Value Date   CHOLHDL 4 04/08/2012   CHOLHDL 3 10/31/2010   CHOLHDL 4 11/30/2009   Lab Results  Component Value Date   LDLDIRECT 106.2 04/08/2012   LDLDIRECT 142.7 10/19/2009   LDLDIRECT 169.3 08/14/2008   pravachol is doing a good job and eating well too   bp is stable today  No cp or palpitations or headaches or edema  No side effects to medicines  BP Readings from Last 3 Encounters:  05/22/12 130/84  01/15/12 120/74  12/15/11 110/80      Patient Active Problem List  Diagnosis  . HYPERCHOLESTEROLEMIA    . ALLERGIC RHINITIS  . Routine general medical examination at a health care facility  . Prostate cancer screening  . Vertigo  . Atrial flutter  . Meniere disease  . ANA positive  . Adverse effects of medication   Past Medical History  Diagnosis Date  . Meniere disease    No past surgical history on file. History  Substance Use Topics  . Smoking status: Never Smoker   . Smokeless tobacco: Not on file  . Alcohol Use: No     social   No family history on file. No Known Allergies Current Outpatient Prescriptions on File Prior to Visit  Medication Sig Dispense Refill  . aspirin 81 MG chewable tablet Chew 81 mg by mouth daily.      . calcium gluconate 650 MG tablet Take 650 mg by mouth daily.      . fluticasone (FLONASE) 50 MCG/ACT nasal spray Place 2 sprays into both nostrils as needed.      . pravastatin (PRAVACHOL) 80 MG tablet TAKE ONE BY MOUTH ONCE DAILY  30 tablet  5  . triamterene-hydrochlorothiazide (DYAZIDE) 37.5-25 MG per capsule Take 1 capsule by mouth daily.      Marland Kitchen  verapamil (CALAN) 80 MG tablet Take 1 capsule by mouth every 12 (twelve) hours.      Marland Kitchen diltiazem (CARDIZEM CD) 120 MG 24 hr capsule Take 120 mg by mouth daily.         Review of Systems    Review of Systems  Constitutional: Negative for fever, appetite change, fatigue and unexpected weight change.  Eyes: Negative for pain and visual disturbance.  Respiratory: Negative for cough and shortness of breath.   Cardiovascular: Negative for cp or palpitations    Gastrointestinal: Negative for nausea, diarrhea and constipation.  Genitourinary: Negative for urgency and frequency.  Skin: Negative for pallor or rash  has a few scaley areas of skin on face (does not use sunscreen) Neurological: Negative for weakness, light-headedness, numbness and headaches.  Hematological: Negative for adenopathy. Does not bruise/bleed easily.  Psychiatric/Behavioral: Negative for dysphoric mood. The patient is not  nervous/anxious.      Objective:   Physical Exam  Constitutional: He appears well-developed and well-nourished.  HENT:  Head: Normocephalic and atraumatic.  Right Ear: External ear normal.  Left Ear: External ear normal.  Nose: Nose normal.  Mouth/Throat: Oropharynx is clear and moist.  Eyes: Conjunctivae normal and EOM are normal. Pupils are equal, round, and reactive to light. Right eye exhibits no discharge. Left eye exhibits no discharge. No scleral icterus.       No nystagmus  Neck: Normal range of motion. Neck supple. No JVD present. No thyromegaly present.  Cardiovascular: Normal rate, regular rhythm, normal heart sounds and intact distal pulses.  Exam reveals no gallop.   Pulmonary/Chest: Effort normal and breath sounds normal. No respiratory distress. He has no wheezes.  Abdominal: Soft. Bowel sounds are normal. He exhibits no distension and no mass. There is no tenderness.  Genitourinary: Rectum normal and prostate normal. Rectal exam shows no mass and anal tone normal. Prostate is not enlarged and not tender.  Musculoskeletal: He exhibits no edema and no tenderness.  Lymphadenopathy:    He has no cervical adenopathy.  Neurological: He is alert. He has normal reflexes. No cranial nerve deficit. He exhibits normal muscle tone. Coordination normal.  Skin: Skin is warm and dry. No rash noted. No erythema. No pallor.  Psychiatric: He has a normal mood and affect.          Assessment & Plan:

## 2012-05-22 NOTE — Patient Instructions (Addendum)
Flu shot today Keep working on healthy diet and exercise Labs are reassuring  We will work on referral for colonoscopy at check out

## 2012-05-22 NOTE — Assessment & Plan Note (Signed)
Reviewed health habits including diet and exercise and skin cancer prevention Also reviewed health mt list, fam hx and immunizations  Reviewed wellness labs in detail 

## 2012-07-15 ENCOUNTER — Encounter: Payer: Self-pay | Admitting: Internal Medicine

## 2012-08-14 ENCOUNTER — Encounter: Payer: 59 | Admitting: Internal Medicine

## 2012-08-18 ENCOUNTER — Other Ambulatory Visit: Payer: Self-pay | Admitting: Family Medicine

## 2012-08-30 ENCOUNTER — Ambulatory Visit (AMBULATORY_SURGERY_CENTER): Payer: 59 | Admitting: *Deleted

## 2012-08-30 ENCOUNTER — Encounter: Payer: Self-pay | Admitting: Internal Medicine

## 2012-08-30 VITALS — Ht 71.5 in | Wt 199.6 lb

## 2012-08-30 DIAGNOSIS — Z1211 Encounter for screening for malignant neoplasm of colon: Secondary | ICD-10-CM

## 2012-08-30 MED ORDER — NA SULFATE-K SULFATE-MG SULF 17.5-3.13-1.6 GM/177ML PO SOLN: ORAL | Status: DC

## 2012-09-12 ENCOUNTER — Ambulatory Visit (AMBULATORY_SURGERY_CENTER): Payer: 59 | Admitting: Internal Medicine

## 2012-09-12 ENCOUNTER — Encounter: Payer: Self-pay | Admitting: Internal Medicine

## 2012-09-12 VITALS — BP 123/76 | HR 53 | Temp 98.2°F | Resp 18 | Ht 71.5 in | Wt 199.0 lb

## 2012-09-12 DIAGNOSIS — D126 Benign neoplasm of colon, unspecified: Secondary | ICD-10-CM

## 2012-09-12 DIAGNOSIS — Z1211 Encounter for screening for malignant neoplasm of colon: Secondary | ICD-10-CM

## 2012-09-12 DIAGNOSIS — K648 Other hemorrhoids: Secondary | ICD-10-CM

## 2012-09-12 MED ORDER — SODIUM CHLORIDE 0.9 % IV SOLN: 500.0000 mL | INTRAVENOUS | Status: DC

## 2012-09-12 NOTE — Op Note (Signed)
[facility] [address] [place], [address]   COLONOSCOPY PROCEDURE REPORT  PATIENT: Marvin Pacheco, Marvin Pacheco  MR#: [id number] BIRTHDATE: [birth year] , 66  yrs. old GENDER: Male ENDOSCOPIST: Iva Boop, MD, [facility] REFERRED [id number] Fransisca Connors, M.D. PROCEDURE DATE:  09/12/2012 PROCEDURE:   Colonoscopy with snare polypectomy ASA CLASS:   Class II INDICATIONS:average risk screening. MEDICATIONS: propofol (Diprivan) 250mg  IV, MAC sedation, administered by CRNA, and These medications were titrated to patient response per physician's verbal order  DESCRIPTION OF PROCEDURE:   After the risks benefits and alternatives of the procedure were thoroughly explained, informed consent was obtained.  A digital rectal exam revealed abnormal texture of the prostate.   The LB [id number]-[id number]  endoscope was introduced through the anus and advanced to the cecum, which was identified by both the appendix and ileocecal valve. No adverse events experienced.   The quality of the prep was Suprep excellent The instrument was then slowly withdrawn as the colon was fully examined.      COLON FINDINGS: A polypoid shaped sessile polyp measuring 5 mm in size was found in the descending colon.  A polypectomy was performed with a cold snare.  The resection was complete and the polyp tissue was completely retrieved.   Small internal hemorrhoids were found.   The colon mucosa was otherwise normal.   A right colon retroflexion was performed.  Retroflexed views revealed internal hemorrhoids. The time to cecum=1 minutes 15 seconds. Withdrawal time=9 minutes 08 seconds.  The scope was withdrawn and the procedure completed. COMPLICATIONS: There were no complications.  ENDOSCOPIC IMPRESSION: 1.   Sessile polyp measuring 5 mm in size was found in the descending colon; polypectomy was performed with a cold snare 2.   Small internal hemorrhoids 3.   The colon mucosa was otherwise normal w/ excllent  prep 4.   Right lobe of prostate was irregular and slightly enlarged RECOMMENDATIONS: Timing of repeat colonoscopy will be determined by pathology findings. Return to Dr. Milinda Antis for recheck of prostate (no abnormalities before, PSA has been ok)   eSigned:  Iva Boop, MD, [facility] 09/12/2012 9:54 AM cc: [name], MD and The Patient

## 2012-09-12 NOTE — Progress Notes (Signed)
Lidocaine-40mg IV prior to Propofol InductionPropofol given over incremental dosages 

## 2012-09-12 NOTE — Progress Notes (Signed)
Called to room to assist during endoscopic procedure.  Patient ID and intended procedure confirmed with present staff. Received instructions for my participation in the procedure from the performing physician.  

## 2012-09-12 NOTE — Progress Notes (Signed)
Patient did not experience any of the following events: a burn prior to discharge; a fall within the facility; wrong site/side/patient/procedure/implant event; or a hospital transfer or hospital admission upon discharge from the facility. (G8907) Patient did not have preoperative order for IV antibiotic SSI prophylaxis. (G8918)  

## 2012-09-12 NOTE — Patient Instructions (Addendum)
One small polyp was removed (benign in appearance) and small internal hemorrhoids were noted.  I thought the right lobe of your prostate was somewhat irregular. Looking back Dr. Milinda Antis thought prostate was ok on last exam and your PSA has been ok also.  I will let you know about the polyp results - do not expect any bad news there.  Please go back to Dr. Milinda Antis to have the prostate checked again (do by next month please).  Thank you for choosing me and [facility].  Iva Boop, MD, FACG  YOU HAD AN ENDOSCOPIC PROCEDURE TODAY AT THE [facility]: Refer to the procedure report that was given to you for any specific questions about what was found during the examination.  If the procedure report does not answer your questions, please call your gastroenterologist to clarify.  If you requested that your care partner not be given the details of your procedure findings, then the procedure report has been included in a sealed envelope for you to review at your convenience later.  YOU SHOULD EXPECT: Some feelings of bloating in the abdomen. Passage of more gas than usual.  Walking can help get rid of the air that was put into your GI tract during the procedure and reduce the bloating. If you had a lower endoscopy (such as a colonoscopy or flexible sigmoidoscopy) you may notice spotting of blood in your stool or on the toilet paper. If you underwent a bowel prep for your procedure, then you may not have a normal bowel movement for a few days.  DIET: Your first meal following the procedure should be a light meal and then it is ok to progress to your normal diet.  A half-sandwich or bowl of soup is an example of a good first meal.  Heavy or fried foods are harder to digest and may make you feel nauseous or bloated.  Likewise meals heavy in dairy and vegetables can cause extra gas to form and this can also increase the bloating.  Drink plenty of fluids but you should avoid alcoholic  beverages for 24 hours.  ACTIVITY: Your care partner should take you home directly after the procedure.  You should plan to take it easy, moving slowly for the rest of the day.  You can resume normal activity the day after the procedure however you should NOT DRIVE or use heavy machinery for 24 hours (because of the sedation medicines used during the test).    SYMPTOMS TO REPORT IMMEDIATELY: A gastroenterologist can be reached at any hour.  During normal business hours, 8:30 AM to 5:00 PM Monday through Friday, call [phone].  After hours and on weekends, please call the GI answering service at [phone] who will take a message and have the physician on call contact you.   Following lower endoscopy (colonoscopy or flexible sigmoidoscopy):  Excessive amounts of blood in the stool  Significant tenderness or worsening of abdominal pains  Swelling of the abdomen that is new, acute  Fever of 100F or higher   FOLLOW UP: If any biopsies were taken you will be contacted by phone or by letter within the next 1-3 weeks.  Call your gastroenterologist if you have not heard about the biopsies in 3 weeks.  Our staff will call the home number listed on your records the next business day following your procedure to check on you and address any questions or concerns that you may have at that time regarding the information given to  you following your procedure. This is a courtesy call and so if there is no answer at the home number and we have not heard from you through the emergency physician on call, we will assume that you have returned to your regular daily activities without incident.  SIGNATURES/CONFIDENTIALITY: You and/or your care partner have signed paperwork which will be entered into your electronic medical record.  These signatures attest to the fact that that the information above on your After Visit Summary has been reviewed and is understood.  Full responsibility of the confidentiality  of this discharge information lies with you and/or your care-partner.   Polyp and hemorrhoid information given.    Dr. Leone Payor will send a letter advising you of next colonoscopy.

## 2012-09-13 ENCOUNTER — Telehealth: Payer: Self-pay

## 2012-09-13 NOTE — Telephone Encounter (Signed)
  Follow up Call-  Call back number 09/12/2012  Post procedure Call Back phone  # [phone]  Permission to leave phone message Yes     Patient questions:  Do you have a fever, pain , or abdominal swelling? no Pain Score  0 *  Have you tolerated food without any problems? yes  Have you been able to return to your normal activities? yes  Do you have any questions about your discharge instructions: Diet   no Medications  no Follow up visit  no  Do you have questions or concerns about your Care? no  Actions: * If pain score is 4 or above: No action needed, pain <4.

## 2012-09-16 ENCOUNTER — Telehealth: Payer: Self-pay | Admitting: Family Medicine

## 2012-09-16 DIAGNOSIS — R3989 Other symptoms and signs involving the genitourinary system: Secondary | ICD-10-CM

## 2012-09-16 NOTE — Telephone Encounter (Signed)
Dr. Milinda Antis sent me a message requesting that I call pt to make sure he is okay with a urologist referral due to Dr. Leone Payor sending her a message letting her know that at pt's colonoscopy his prostate was irregular and had a nodular. I have called pt twice and had no answer so I left 2 voicemails requesting pt to call office, will try to call back later

## 2012-09-17 DIAGNOSIS — R3989 Other symptoms and signs involving the genitourinary system: Secondary | ICD-10-CM | POA: Insufficient documentation

## 2012-09-17 NOTE — Telephone Encounter (Signed)
Pt notified and agrees with referral, advise that Shirlee Limerick will call him to set that up

## 2012-09-17 NOTE — Telephone Encounter (Signed)
Ref for urology

## 2012-09-18 ENCOUNTER — Encounter: Payer: Self-pay | Admitting: Internal Medicine

## 2012-09-18 DIAGNOSIS — Z860100 Personal history of colon polyps, unspecified: Secondary | ICD-10-CM | POA: Insufficient documentation

## 2012-09-18 NOTE — Progress Notes (Signed)
Quick Note:  5 mm adenoma repeat colon 09/2017 ______

## 2012-12-09 ENCOUNTER — Encounter: Payer: Self-pay | Admitting: Family Medicine

## 2013-02-16 ENCOUNTER — Other Ambulatory Visit: Payer: Self-pay | Admitting: Family Medicine

## 2013-02-17 NOTE — Telephone Encounter (Signed)
CPE/ lab appt scheduled and meds refilled

## 2013-02-17 NOTE — Telephone Encounter (Signed)
Please schedule f/u for fall and refill until then

## 2013-02-17 NOTE — Telephone Encounter (Signed)
Electronic refill request, no recent appt/no future appt, please advise

## 2013-03-23 IMAGING — CT CT HEAD WITHOUT CONTRAST
2 series · 16 of 30 positions shown, 20 images · non-contrast
Comparison: none

REASON FOR EXAM: dizziness
COMMENTS:

PROCEDURE:     CT  - CT HEAD WITHOUT CONTRAST  - November 19, 2011 [DATE]
RESULT:     Comparison:  None
TECHNIQUE: Multiple axial images from the foramen magnum to the vertex were
obtained without IV contrast.

[Series 2: without · axial · non-contrast · 0.41mm/px · z∈[+388,+514]mm · 13 of 31 slices shown, 17 images]
[im 3/31  brain]
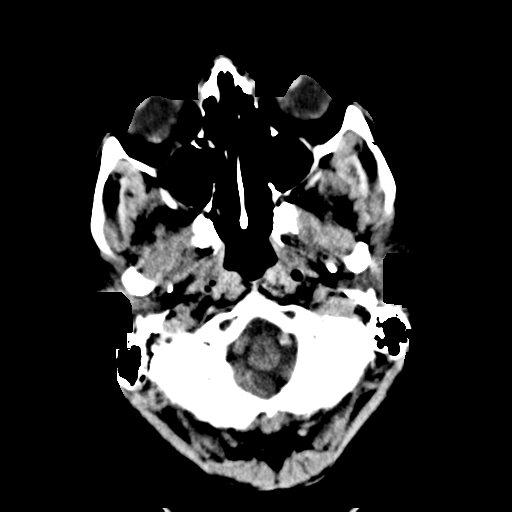
[im 3/31  bone]
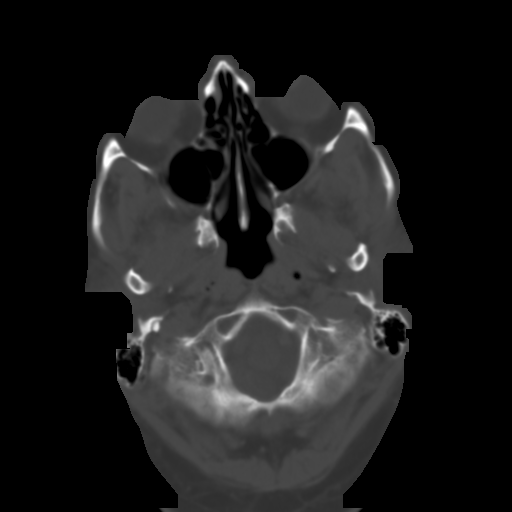
[im 5/31  brain]
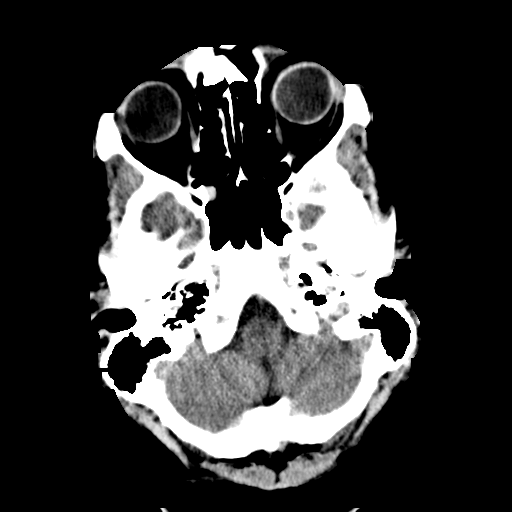
[im 7/31  brain]
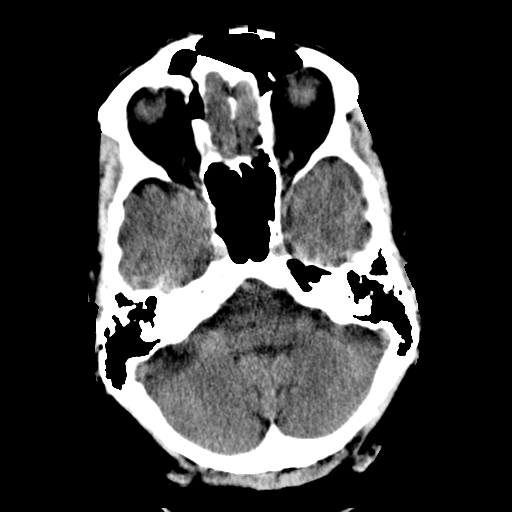
[im 9/31  brain]
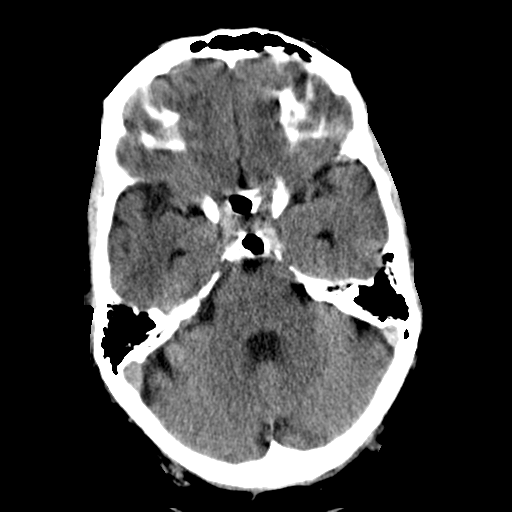
[im 11/31  brain]
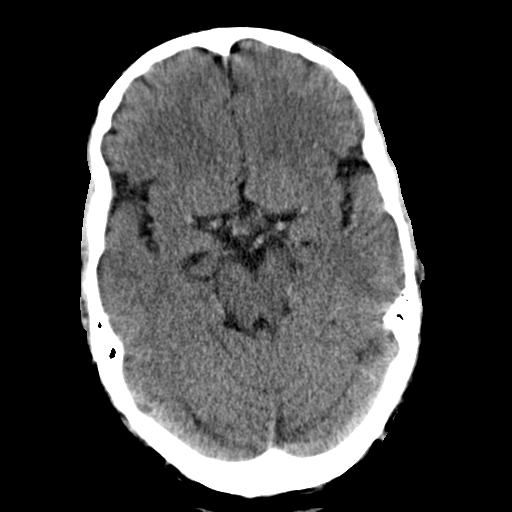
[im 11/31  bone]
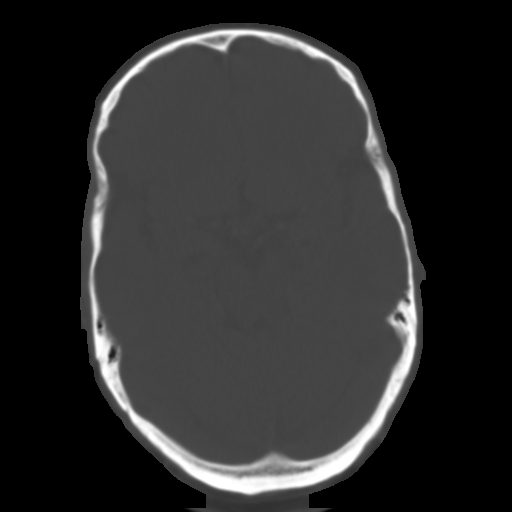
[im 13/31  brain]
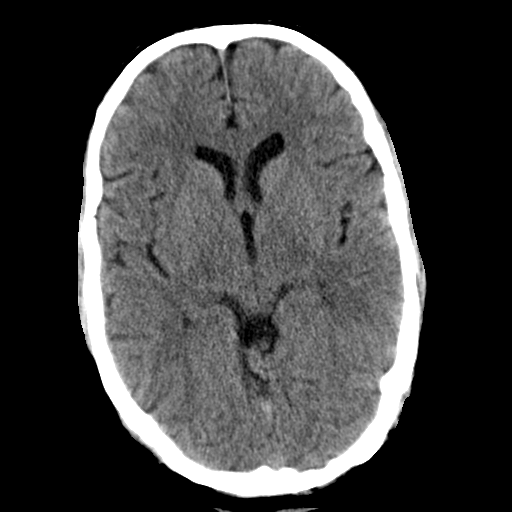
[im 16/31  brain]
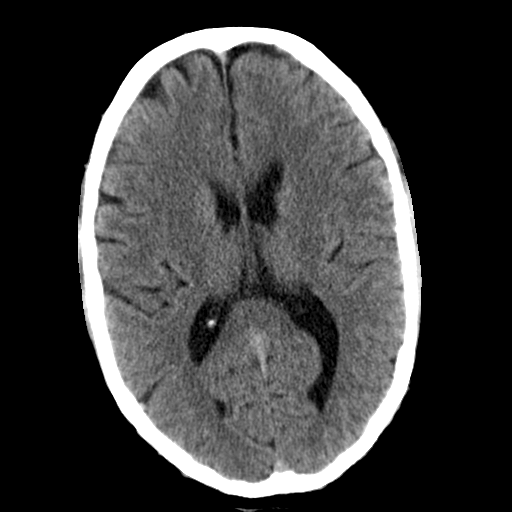
[im 18/31  brain]
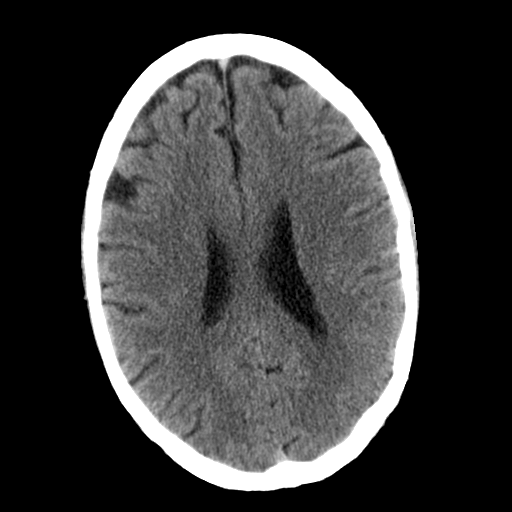
[im 20/31  brain]
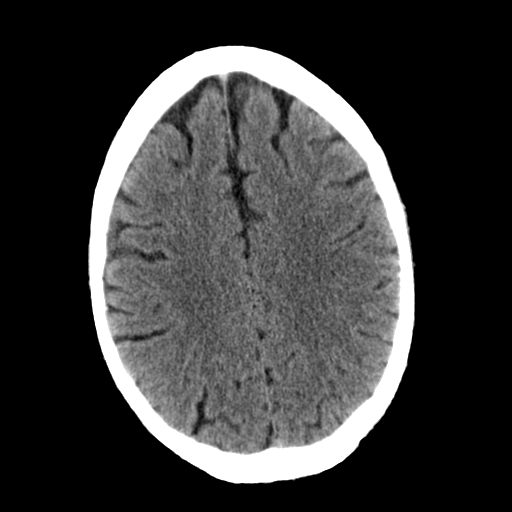
[im 20/31  bone]
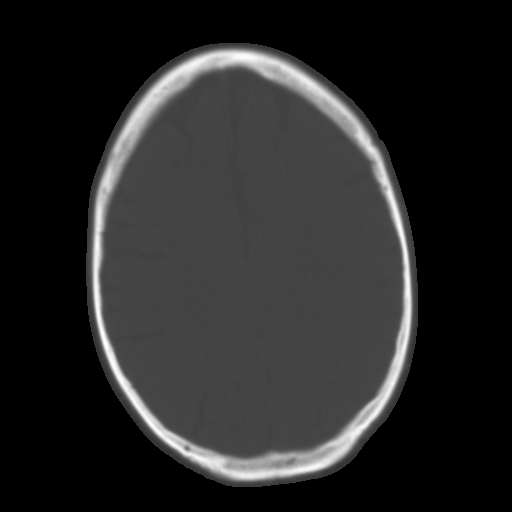
[im 22/31  brain]
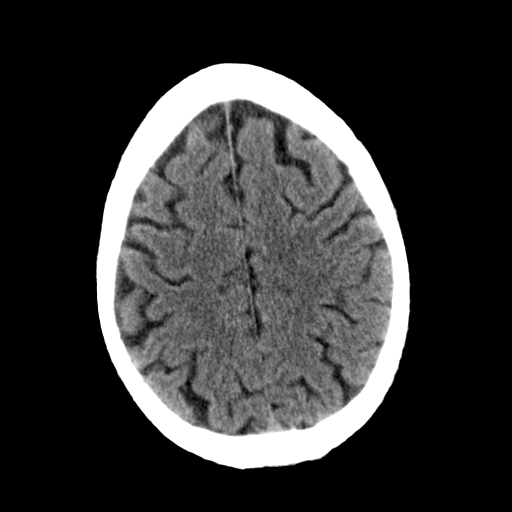
[im 24/31  brain]
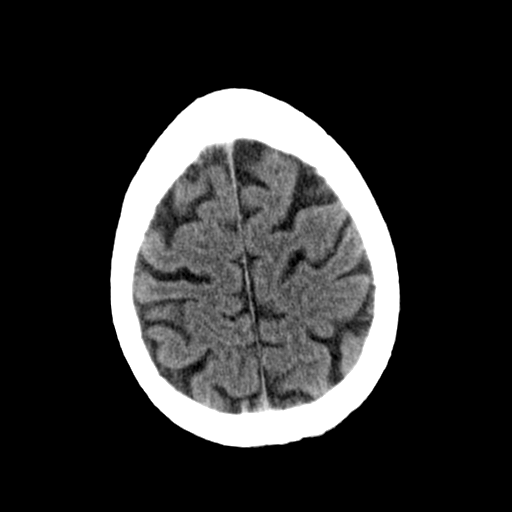
[im 26/31  brain]
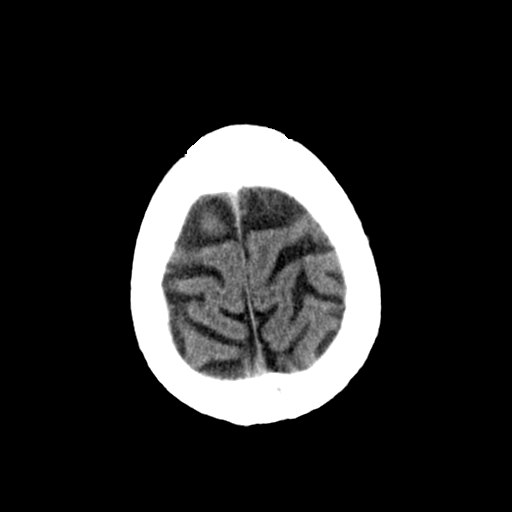
[im 28/31  brain]
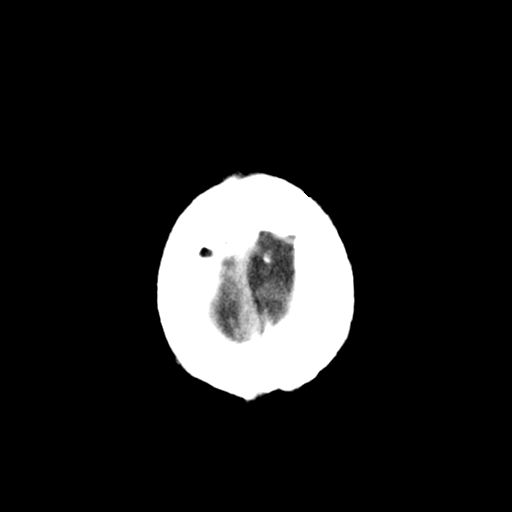
[im 28/31  bone]
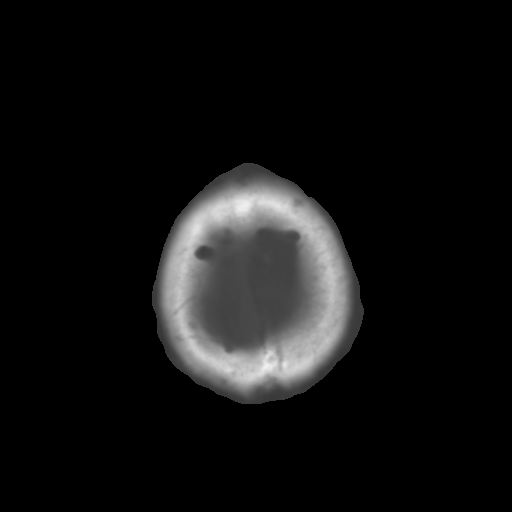

[Series 3: bone · axial · 0.41mm/px · z∈[+388,+428]mm · 3 of 31 slices shown]
[im 3/31  bone]
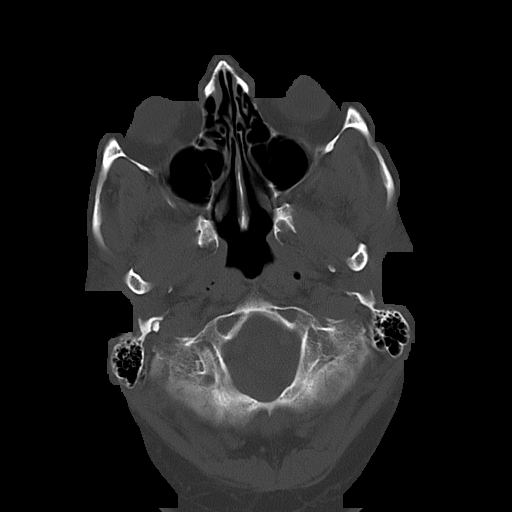
[im 7/31  bone]
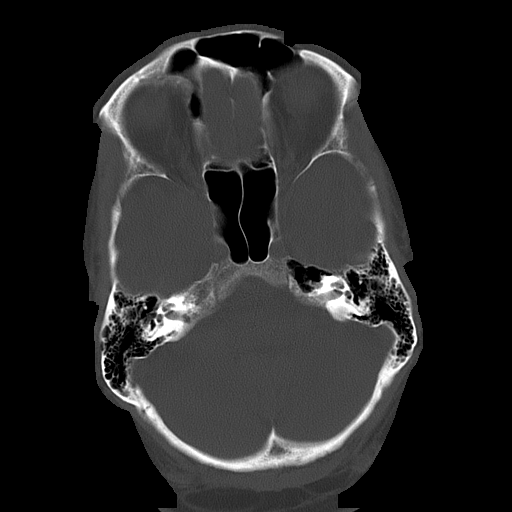
[im 11/31  bone]
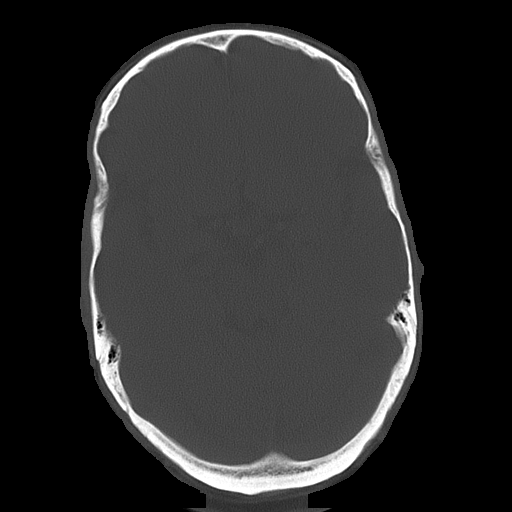

[16 of 30 positions shown; findings below may reference images not displayed]

FINDINGS: There is no evidence of mass effect, midline shift, or extra-axial fluid
collections.  There is no evidence of a space-occupying lesion or
intracranial hemorrhage. There is no evidence of a cortical-based area of
acute infarction.

The ventricles and sulci are appropriate for the patient's age. The basal
cisterns are patent.

Visualized portions of the orbits are unremarkable. The visualized portions
of the paranasal sinuses and mastoid air cells are unremarkable.

The osseous structures are unremarkable.
IMPRESSION: No acute intracranial process.

## 2013-05-21 ENCOUNTER — Telehealth: Payer: Self-pay | Admitting: Family Medicine

## 2013-05-21 DIAGNOSIS — Z0000 Encounter for general adult medical examination without abnormal findings: Secondary | ICD-10-CM

## 2013-05-21 DIAGNOSIS — Z125 Encounter for screening for malignant neoplasm of prostate: Secondary | ICD-10-CM

## 2013-05-21 DIAGNOSIS — E7800 Pure hypercholesterolemia, unspecified: Secondary | ICD-10-CM

## 2013-05-21 NOTE — Telephone Encounter (Signed)
Message copied by [name] on Wed May 21, 2013  7:44 PM ------      Message from: Alvina Chou      Created: Thu May 15, 2013 11:27 AM      Regarding: Lab orders for Thursday, 9.18.14       Patient is scheduled for CPX labs, please order future labs, Thanks , Marvin Pacheco       ------

## 2013-05-22 ENCOUNTER — Other Ambulatory Visit (INDEPENDENT_AMBULATORY_CARE_PROVIDER_SITE_OTHER): Payer: 59

## 2013-05-22 DIAGNOSIS — Z0000 Encounter for general adult medical examination without abnormal findings: Secondary | ICD-10-CM

## 2013-05-22 DIAGNOSIS — E7800 Pure hypercholesterolemia, unspecified: Secondary | ICD-10-CM

## 2013-05-22 DIAGNOSIS — Z125 Encounter for screening for malignant neoplasm of prostate: Secondary | ICD-10-CM

## 2013-05-22 LAB — CBC WITH DIFFERENTIAL/PLATELET
Basophils Absolute: 0.1 10*3/uL (ref 0.0–0.1)
Eosinophils Absolute: 0.2 10*3/uL (ref 0.0–0.7)
Eosinophils Relative: 2.8 % (ref 0.0–5.0)
HCT: 46.6 % (ref 39.0–52.0)
Lymphs Abs: 2 10*3/uL (ref 0.7–4.0)
MCHC: 34.5 g/dL (ref 30.0–36.0)
MCV: 92.6 fl (ref 78.0–100.0)
Monocytes Absolute: 0.6 10*3/uL (ref 0.1–1.0)
Neutrophils Relative %: 63.4 % (ref 43.0–77.0)
Platelets: 395 10*3/uL (ref 150.0–400.0)
RDW: 13.4 % (ref 11.5–14.6)
WBC: 8 10*3/uL (ref 4.5–10.5)

## 2013-05-22 LAB — COMPREHENSIVE METABOLIC PANEL
ALT: 30 U/L (ref 0–53)
AST: 25 U/L (ref 0–37)
Albumin: 4.1 g/dL (ref 3.5–5.2)
Alkaline Phosphatase: 47 U/L (ref 39–117)
Potassium: 3.8 mEq/L (ref 3.5–5.1)
Sodium: 136 mEq/L (ref 135–145)
Total Bilirubin: 0.8 mg/dL (ref 0.3–1.2)
Total Protein: 6.6 g/dL (ref 6.0–8.3)

## 2013-05-22 LAB — LIPID PANEL
Cholesterol: 217 mg/dL — ABNORMAL HIGH (ref 0–200)
Total CHOL/HDL Ratio: 3
VLDL: 32.4 mg/dL (ref 0.0–40.0)

## 2013-05-22 LAB — PSA: PSA: 2.88 ng/mL (ref 0.10–4.00)

## 2013-05-22 LAB — LDL CHOLESTEROL, DIRECT: Direct LDL: 129.8 mg/dL

## 2013-05-22 LAB — TSH: TSH: 1.2 u[IU]/mL (ref 0.35–5.50)

## 2013-05-27 ENCOUNTER — Encounter: Payer: Self-pay | Admitting: Family Medicine

## 2013-05-27 ENCOUNTER — Ambulatory Visit (INDEPENDENT_AMBULATORY_CARE_PROVIDER_SITE_OTHER): Payer: 59 | Admitting: Family Medicine

## 2013-05-27 VITALS — BP 106/74 | HR 69 | Temp 98.1°F | Ht 70.75 in | Wt 195.5 lb

## 2013-05-27 DIAGNOSIS — Z23 Encounter for immunization: Secondary | ICD-10-CM

## 2013-05-27 DIAGNOSIS — Z0000 Encounter for general adult medical examination without abnormal findings: Secondary | ICD-10-CM

## 2013-05-27 DIAGNOSIS — E7800 Pure hypercholesterolemia, unspecified: Secondary | ICD-10-CM

## 2013-05-27 DIAGNOSIS — Z125 Encounter for screening for malignant neoplasm of prostate: Secondary | ICD-10-CM

## 2013-05-27 MED ORDER — FLUTICASONE PROPIONATE 50 MCG/ACT NA SUSP: 2.0000 | Freq: Every day | NASAL | Status: DC

## 2013-05-27 NOTE — Progress Notes (Signed)
Subjective:    Patient ID: Marvin Pacheco, male    DOB: 11/27/1945, 67 y.o.   MRN: [id number]  HPI Here for health maintenance exam and to review chronic medical problems    fx his 5th finger this year - his fretting finger for guitar and it severed the tendon - went to [facility] orthopedics  Was splinting and did not do surgery - still sleeps in splint  Has been playing a bit with it  That was very frustrating   He has been feeling well in general   menieres is fairly well controlled - had one day of dizziness - too much salt that day    Wt is down 4 lb with bmi of 27 Still wants to loose more  Some exercise -but not regular - and he does mow his yard  In general eats a healthy diet - cheats occasionally  Lots of green vegetables   Flu vaccine - today Pneumovax- will get that today as well   Td 2/12 Zoster vacc 3/12  colonosc 1/14 with 5 year recall for adenom polyp  Prostate ca screen irreg prostate noted at time of colonosc Saw urol at [facility]- and had a prostate bx - per pt result was "precancerous"-- will watch it and see yearly  Lab Results  Component Value Date   PSA 2.88 05/22/2013   PSA 2.17 04/08/2012   PSA 1.95 10/31/2010  no family history of prostate cancer  No urinary symptoms at all -no nocturia and no problems with flow   Hyperlipidemia On high dose pravastatin  Lab Results  Component Value Date   CHOL 217* 05/22/2013   CHOL 205* 04/08/2012   CHOL 196 10/31/2010   Lab Results  Component Value Date   HDL 67.40 05/22/2013   HDL 40.98 04/08/2012   HDL 56.10 10/31/2010   Lab Results  Component Value Date   LDLCALC 117* 10/31/2010   LDLCALC 117* 11/30/2009   LDLCALC 107* 01/09/2008   Lab Results  Component Value Date   TRIG 162.0* 05/22/2013   TRIG 261.0* 04/08/2012   TRIG 117.0 10/31/2010   Lab Results  Component Value Date   CHOLHDL 3 05/22/2013   CHOLHDL 4 04/08/2012   CHOLHDL 3 10/31/2010   Lab Results  Component Value Date   LDLDIRECT 129.8 05/22/2013   LDLDIRECT 106.2 04/08/2012   LDLDIRECT 142.7 10/19/2009    LDL is worse and the HDL went way up  Ratio is not bad  He does eat a lot of fish -and that is helpful   Some dry spots on his face - temples Several moles   Patient Active Problem List   Diagnosis Date Noted  . Encounter for Medicare annual wellness exam 05/21/2013  . Personal history of colonic adenoma 09/18/2012  . Abnormal prostate exam 09/17/2012  . Colon cancer screening 05/22/2012  . Meniere disease 01/15/2012  . ANA positive 01/15/2012  . Adverse effects of medication 01/15/2012  . Vertigo 12/15/2011  . Atrial flutter 12/15/2011  . Routine general medical examination at a health care facility 10/03/2011  . Prostate cancer screening 10/03/2011  . ALLERGIC RHINITIS 04/08/2008  . HYPERCHOLESTEROLEMIA 01/09/2008   Past Medical History  Diagnosis Date  . Meniere disease   . Seasonal allergies   . Cataract   . Hyperlipidemia   . Atrial flutter    Past Surgical History  Procedure Laterality Date  . Bilateral inguinal hernia  1990  . Revision hernia      2000 and 2006. Revision of  inguinal hernia  . Lipoma abdomen  2000   History  Substance Use Topics  . Smoking status: Never Smoker   . Smokeless tobacco: Never Used  . Alcohol Use: No     Comment: social   Family History  Problem Relation Age of Onset  . Colon cancer Neg Hx   . Stomach cancer Neg Hx    No Known Allergies Current Outpatient Prescriptions on File Prior to Visit  Medication Sig Dispense Refill  . Ascorbic Acid (VITAMIN C) 1000 MG tablet Take 1,000 mg by mouth daily.      Marland Kitchen aspirin 81 MG chewable tablet Chew 81 mg by mouth daily.      . calcium gluconate 650 MG tablet Take 650 mg by mouth daily.      . cholecalciferol (VITAMIN D) 1000 UNITS tablet Take 4,000 Units by mouth daily.      . fluticasone (FLONASE) 50 MCG/ACT nasal spray Place 2 sprays into both nostrils as needed.      . Multiple Vitamin (MULTIVITAMIN) capsule Take 1 capsule by  mouth daily.      . pravastatin (PRAVACHOL) 80 MG tablet TAKE 1 TABLET BY MOUTH DAILY  30 tablet  3  . verapamil (CALAN) 80 MG tablet Take 1 capsule by mouth every 12 (twelve) hours.      Marland Kitchen co-enzyme Q-10 50 MG capsule Take 100 mg by mouth daily.       No current facility-administered medications on file prior to visit.     Review of Systems Review of Systems  Constitutional: Negative for fever, appetite change, fatigue and unexpected weight change.  Eyes: Negative for pain and visual disturbance.  Respiratory: Negative for cough and shortness of breath.   Cardiovascular: Negative for cp or palpitations    Gastrointestinal: Negative for nausea, diarrhea and constipation.  Genitourinary: Negative for urgency and frequency.  Skin: Negative for pallor or rash  pos for some moles  Neurological: Negative for weakness, light-headedness, numbness and headaches.  Hematological: Negative for adenopathy. Does not bruise/bleed easily.  Psychiatric/Behavioral: Negative for dysphoric mood. The patient is not nervous/anxious.         Objective:   Physical Exam  Constitutional: He appears well-developed and well-nourished. No distress.  overwt and well appearing   HENT:  Head: Normocephalic and atraumatic.  Right Ear: External ear normal.  Left Ear: External ear normal.  Nose: Nose normal.  Mouth/Throat: Oropharynx is clear and moist.  Eyes: Conjunctivae and EOM are normal. Pupils are equal, round, and reactive to light. Right eye exhibits no discharge. Left eye exhibits no discharge. No scleral icterus.  Neck: Normal range of motion. Neck supple. No JVD present. Carotid bruit is not present. No thyromegaly present.  Cardiovascular: Normal rate, regular rhythm, normal heart sounds and intact distal pulses.  Exam reveals no gallop.   Pulmonary/Chest: Effort normal and breath sounds normal. No respiratory distress. He has no wheezes. He has no rales.  Abdominal: Soft. Bowel sounds are normal. He  exhibits no distension, no abdominal bruit and no mass. There is no tenderness.  Genitourinary:  Rectal/ prostate exam is per urology  Musculoskeletal: Normal range of motion. He exhibits no edema and no tenderness.  Lymphadenopathy:    He has no cervical adenopathy.  Neurological: He is alert. He has normal reflexes. No cranial nerve deficit. He exhibits normal muscle tone. Coordination normal.  Skin: Skin is warm and dry. No rash noted. No erythema. No pallor.  Small comedone expressed on L lower back  Some AKs on temples  Psychiatric: His behavior is normal. Thought content normal. His mood appears anxious. His speech is rapid and/or pressured.          Assessment & Plan:

## 2013-05-27 NOTE — Patient Instructions (Addendum)
Flu vaccine today  Pneumovax today  Take care of yourself and work on healthy exercise Continue [facility] follow up for prostate checks   For actinic keratoses on face - follow up for visit to freeze them when able

## 2013-05-28 NOTE — Assessment & Plan Note (Signed)
Reviewed health habits including diet and exercise and skin cancer prevention Also reviewed health mt list, fam hx and immunizations   Wellness labs reviewed  

## 2013-05-28 NOTE — Assessment & Plan Note (Signed)
Reviewed health habits including diet and exercise and skin cancer prevention Also reviewed health mt list, fam hx and immunizations  See HPI Wellness labs reviewed  

## 2013-05-28 NOTE — Assessment & Plan Note (Signed)
Disc goals for lipids and reasons to control them Rev labs with pt Rev low sat fat diet in detail  HDL is up

## 2013-05-28 NOTE — Assessment & Plan Note (Signed)
Pt has irregular prostate with recent bx Followed by urology at [facility]

## 2013-06-09 ENCOUNTER — Other Ambulatory Visit: Payer: Self-pay | Admitting: Family Medicine

## 2013-07-22 ENCOUNTER — Other Ambulatory Visit: Payer: Self-pay | Admitting: Family Medicine

## 2013-12-15 ENCOUNTER — Other Ambulatory Visit: Payer: Self-pay | Admitting: Family Medicine

## 2013-12-15 MED ORDER — PRAVASTATIN SODIUM 80 MG PO TABS: ORAL_TABLET | ORAL | Status: DC

## 2014-06-17 ENCOUNTER — Other Ambulatory Visit: Payer: Self-pay | Admitting: Family Medicine

## 2014-07-06 ENCOUNTER — Telehealth: Payer: Self-pay | Admitting: Family Medicine

## 2014-07-06 DIAGNOSIS — Z125 Encounter for screening for malignant neoplasm of prostate: Secondary | ICD-10-CM

## 2014-07-06 DIAGNOSIS — E7800 Pure hypercholesterolemia, unspecified: Secondary | ICD-10-CM

## 2014-07-06 DIAGNOSIS — Z0000 Encounter for general adult medical examination without abnormal findings: Secondary | ICD-10-CM

## 2014-07-06 NOTE — Telephone Encounter (Signed)
-----   Message from Ellamae Sia sent at 07/01/2014 12:14 PM EDT ----- Regarding: Lab orders for Tuesday, 11.3.15 Patient is scheduled for CPX labs, please order future labs, Thanks , Karna Christmas

## 2014-07-07 ENCOUNTER — Other Ambulatory Visit (INDEPENDENT_AMBULATORY_CARE_PROVIDER_SITE_OTHER): Payer: 59

## 2014-07-07 DIAGNOSIS — E7800 Pure hypercholesterolemia, unspecified: Secondary | ICD-10-CM

## 2014-07-07 DIAGNOSIS — Z0000 Encounter for general adult medical examination without abnormal findings: Secondary | ICD-10-CM

## 2014-07-07 DIAGNOSIS — R3989 Other symptoms and signs involving the genitourinary system: Secondary | ICD-10-CM

## 2014-07-07 DIAGNOSIS — Z125 Encounter for screening for malignant neoplasm of prostate: Secondary | ICD-10-CM

## 2014-07-07 LAB — LIPID PANEL
CHOL/HDL RATIO: 3
Cholesterol: 227 mg/dL — ABNORMAL HIGH (ref 0–200)
HDL: 67.7 mg/dL (ref >39.00)
LDL Cholesterol: 129 mg/dL — ABNORMAL HIGH (ref 0–99)
NONHDL: 159.3
Triglycerides: 152 mg/dL — ABNORMAL HIGH (ref 0.0–149.0)
VLDL: 30.4 mg/dL (ref 0.0–40.0)

## 2014-07-07 LAB — COMPREHENSIVE METABOLIC PANEL
ALBUMIN: 3.9 g/dL (ref 3.5–5.2)
ALK PHOS: 50 U/L (ref 39–117)
ALT: 35 U/L (ref 0–53)
AST: 28 U/L (ref 0–37)
BILIRUBIN TOTAL: 0.8 mg/dL (ref 0.2–1.2)
BUN: 16 mg/dL (ref 6–23)
CO2: 30 mEq/L (ref 19–32)
Calcium: 9.3 mg/dL (ref 8.4–10.5)
Chloride: 99 mEq/L (ref 96–112)
Creatinine, Ser: 0.8 mg/dL (ref 0.4–1.5)
GFR: 101.25 mL/min (ref >60.00)
GLUCOSE: 95 mg/dL (ref 70–99)
POTASSIUM: 4.1 meq/L (ref 3.5–5.1)
SODIUM: 135 meq/L (ref 135–145)
TOTAL PROTEIN: 7.1 g/dL (ref 6.0–8.3)

## 2014-07-07 LAB — CBC WITH DIFFERENTIAL/PLATELET
BASOS ABS: 0.1 10*3/uL (ref 0.0–0.1)
Basophils Relative: 0.7 % (ref 0.0–3.0)
EOS ABS: 0.3 10*3/uL (ref 0.0–0.7)
EOS PCT: 3.8 % (ref 0.0–5.0)
HCT: 49.7 % (ref 39.0–52.0)
Hemoglobin: 16.7 g/dL (ref 13.0–17.0)
Lymphocytes Relative: 27.2 % (ref 12.0–46.0)
Lymphs Abs: 2.3 10*3/uL (ref 0.7–4.0)
MCHC: 33.7 g/dL (ref 30.0–36.0)
MCV: 93.5 fl (ref 78.0–100.0)
MONO ABS: 0.7 10*3/uL (ref 0.1–1.0)
Monocytes Relative: 8.3 % (ref 3.0–12.0)
NEUTROS PCT: 60 % (ref 43.0–77.0)
Neutro Abs: 5.1 10*3/uL (ref 1.4–7.7)
PLATELETS: 396 10*3/uL (ref 150.0–400.0)
RBC: 5.32 Mil/uL (ref 4.22–5.81)
RDW: 12.7 % (ref 11.5–15.5)
WBC: 8.6 10*3/uL (ref 4.0–10.5)

## 2014-07-07 LAB — TSH: TSH: 1.35 u[IU]/mL (ref 0.35–4.50)

## 2014-07-07 LAB — PSA: PSA: 2.53 ng/mL (ref 0.10–4.00)

## 2014-07-14 ENCOUNTER — Ambulatory Visit (INDEPENDENT_AMBULATORY_CARE_PROVIDER_SITE_OTHER): Payer: 59 | Admitting: Family Medicine

## 2014-07-14 ENCOUNTER — Encounter: Payer: Self-pay | Admitting: Family Medicine

## 2014-07-14 VITALS — BP 102/62 | HR 76 | Temp 98.3°F | Ht 70.5 in | Wt 197.2 lb

## 2014-07-14 DIAGNOSIS — Z125 Encounter for screening for malignant neoplasm of prostate: Secondary | ICD-10-CM

## 2014-07-14 DIAGNOSIS — Z0000 Encounter for general adult medical examination without abnormal findings: Secondary | ICD-10-CM

## 2014-07-14 DIAGNOSIS — E7800 Pure hypercholesterolemia, unspecified: Secondary | ICD-10-CM

## 2014-07-14 DIAGNOSIS — L570 Actinic keratosis: Secondary | ICD-10-CM | POA: Insufficient documentation

## 2014-07-14 DIAGNOSIS — I719 Aortic aneurysm of unspecified site, without rupture: Secondary | ICD-10-CM | POA: Insufficient documentation

## 2014-07-14 MED ORDER — PRAVASTATIN SODIUM 80 MG PO TABS: ORAL_TABLET | ORAL | Status: DC

## 2014-07-14 NOTE — Progress Notes (Signed)
Pre visit review using our clinic review tool, if applicable. No additional management support is needed unless otherwise documented below in the visit note. 

## 2014-07-14 NOTE — Progress Notes (Signed)
Subjective:    Patient ID: Marvin Pacheco, male    DOB: 02-04-1946, 68 y.o.   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 - 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88 05/22/2013   PSA 2.17 04/08/2012   was seeing urology at [place] goes once per year  psa is stable  No voiding symptoms    Hyperlipidemia On pravastatin and diet Lab Results  Component Value Date   CHOL 227* 07/07/2014   CHOL 217* 05/22/2013   CHOL 205* 04/08/2012   Lab Results  Component Value Date   HDL 67.70 07/07/2014   HDL 67.40 05/22/2013   HDL 50.10 04/08/2012   Lab Results  Component Value Date   LDLCALC 129* 07/07/2014   LDLCALC 117* 10/31/2010   LDLCALC 117* 11/30/2009   Lab Results  Component Value Date   TRIG 152.0*  07/07/2014   TRIG 162.0* 05/22/2013   TRIG 261.0* 04/08/2012   Lab Results  Component Value Date   CHOLHDL 3 07/07/2014   CHOLHDL 3 05/22/2013   CHOLHDL 4 04/08/2012   Lab Results  Component Value Date   LDLDIRECT 129.8 05/22/2013   LDLDIRECT 106.2 04/08/2012   LDLDIRECT 142.7 10/19/2009   overall quite stable on 80 mg of pravastatin  Breakfast and lunch are good and low fat  Supper - can be worse-some pizza and occ junk foods - most of the time chicken and vegetables  Trying to cut back on nitrates       BP Readings from Last 3 Encounters:  07/14/14 102/62  05/27/13 106/74  09/12/12 123/76   Takes hctz for menieres    Chemistry      Component Value Date/Time   NA 135 07/07/2014 0831   K 4.1 07/07/2014 0831   CL 99 07/07/2014 0831   CO2 30 07/07/2014 0831   BUN 16 07/07/2014 0831   CREATININE 0.8 07/07/2014 0831      Component Value Date/Time   CALCIUM 9.3 07/07/2014 0831   ALKPHOS 50 07/07/2014 0831  AST 28 07/07/2014 0831   ALT 35 07/07/2014 0831   BILITOT 0.8 07/07/2014 0831       Review of Systems     Objective:   Physical Exam        Assessment & Plan:

## 2014-07-14 NOTE — Patient Instructions (Signed)
Take care of yourself  Try to walk more  Use sunscreen - especially on arms and face For cholesterol   (Avoid red meat/ fried foods/ egg yolks/ fatty breakfast meats/ butter, cheese and high fat dairy/ and shellfish ) Continue follow ups with your urologist  When you are due for your next cardiology visit - if you want to change to one of our [place] cardiologist- let us know and I will do a referral     Fat and Cholesterol Control Diet Fat and cholesterol levels in your blood and organs are influenced by your diet. High levels of fat and cholesterol may lead to diseases of the heart, small and large blood vessels, gallbladder, liver, and pancreas. CONTROLLING FAT AND CHOLESTEROL WITH DIET Although exercise and lifestyle factors are important, your diet is key. That is because certain foods are known to raise cholesterol and others to lower it. The goal is to balance foods for their effect on cholesterol and more importantly, to replace saturated and trans fat with other types of fat, such as monounsaturated fat, polyunsaturated fat, and omega-3 fatty acids. On average, a person should consume no more than 15 to 17 g of saturated fat daily. Saturated and trans fats are considered "bad" fats, and they will raise LDL cholesterol. Saturated fats are primarily found in animal products such as meats, butter, and cream. However, that does not mean you need to give up all your favorite foods. Today, there are good tasting, low-fat, low-cholesterol substitutes for most of the things you like to eat. Choose low-fat or nonfat alternatives. Choose round or loin cuts of red meat. These types of cuts are lowest in fat and cholesterol. Chicken (without the skin), fish, veal, and ground [place] breast are great choices. Eliminate fatty meats, such as hot dogs and salami. Even shellfish have little or no saturated fat. Have a 3 oz (85 g) portion when you eat lean meat, poultry, or fish. Trans fats are also called  "partially hydrogenated oils." They are oils that have been scientifically manipulated so that they are solid at room temperature resulting in a longer shelf life and improved taste and texture of foods in which they are added. Trans fats are found in stick margarine, some tub margarines, cookies, crackers, and baked goods.  When baking and cooking, oils are a great substitute for butter. The monounsaturated oils are especially beneficial since it is believed they lower LDL and raise HDL. The oils you should avoid entirely are saturated tropical oils, such as coconut and palm.  Remember to eat a lot from food groups that are naturally free of saturated and trans fat, including fish, fruit, vegetables, beans, grains (barley, rice, couscous, bulgur wheat), and pasta (without cream sauces).  IDENTIFYING FOODS THAT LOWER FAT AND CHOLESTEROL  Soluble fiber may lower your cholesterol. This type of fiber is found in fruits such as apples, vegetables such as broccoli, potatoes, and carrots, legumes such as beans, peas, and lentils, and grains such as barley. Foods fortified with plant sterols (phytosterol) may also lower cholesterol. You should eat at least 2 g per day of these foods for a cholesterol lowering effect.  Read package labels to identify low-saturated fats, trans fat free, and low-fat foods at the supermarket. Select cheeses that have only 2 to 3 g saturated fat per ounce. Use a heart-healthy tub margarine that is free of trans fats or partially hydrogenated oil. When buying baked goods (cookies, crackers), avoid partially hydrogenated oils. Breads and muffins should  be made from whole grains (whole-wheat or whole oat flour, instead of "flour" or "enriched flour"). Buy non-creamy canned soups with reduced salt and no added fats.  FOOD PREPARATION TECHNIQUES  Never deep-fry. If you must fry, either stir-fry, which uses very little fat, or use non-stick cooking sprays. When possible, broil, bake, or roast  meats, and steam vegetables. Instead of putting butter or margarine on vegetables, use lemon and herbs, applesauce, and cinnamon (for squash and sweet potatoes). Use nonfat yogurt, salsa, and low-fat dressings for salads.  LOW-SATURATED FAT / LOW-FAT FOOD SUBSTITUTES Meats / Saturated Fat (g)  Avoid: Steak, marbled (3 oz/85 g) / 11 g  Choose: Steak, lean (3 oz/85 g) / 4 g  Avoid: Hamburger (3 oz/85 g) / 7 g  Choose: Hamburger, lean (3 oz/85 g) / 5 g  Avoid: Ham (3 oz/85 g) / 6 g  Choose: Ham, lean cut (3 oz/85 g) / 2.4 g  Avoid: Chicken, with skin, dark meat (3 oz/85 g) / 4 g  Choose: Chicken, skin removed, dark meat (3 oz/85 g) / 2 g  Avoid: Chicken, with skin, light meat (3 oz/85 g) / 2.5 g  Choose: Chicken, skin removed, light meat (3 oz/85 g) / 1 g Dairy / Saturated Fat (g)  Avoid: Whole milk (1 cup) / 5 g  Choose: Low-fat milk, 2% (1 cup) / 3 g  Choose: Low-fat milk, 1% (1 cup) / 1.5 g  Choose: Skim milk (1 cup) / 0.3 g  Avoid: Hard cheese (1 oz/28 g) / 6 g  Choose: Skim milk cheese (1 oz/28 g) / 2 to 3 g  Avoid: Cottage cheese, 4% fat (1 cup) / 6.5 g  Choose: Low-fat cottage cheese, 1% fat (1 cup) / 1.5 g  Avoid: Ice cream (1 cup) / 9 g  Choose: Sherbet (1 cup) / 2.5 g  Choose: Nonfat frozen yogurt (1 cup) / 0.3 g  Choose: Frozen fruit bar / trace  Avoid: Whipped cream (1 tbs) / 3.5 g  Choose: Nondairy whipped topping (1 tbs) / 1 g Condiments / Saturated Fat (g)  Avoid: Mayonnaise (1 tbs) / 2 g  Choose: Low-fat mayonnaise (1 tbs) / 1 g  Avoid: Butter (1 tbs) / 7 g  Choose: Extra light margarine (1 tbs) / 1 g  Avoid: Coconut oil (1 tbs) / 11.8 g  Choose: Olive oil (1 tbs) / 1.8 g  Choose: Corn oil (1 tbs) / 1.7 g  Choose: Safflower oil (1 tbs) / 1.2 g  Choose: Sunflower oil (1 tbs) / 1.4 g  Choose: Soybean oil (1 tbs) / 2.4 g  Choose: Canola oil (1 tbs) / 1 g Document Released: 08/21/2005 Document Revised: 12/16/2012 Document Reviewed:  11/19/2013 ExitCare Patient Information 2015 [place], [place]. This information is not intended to replace advice given to you by your health care provider. Make sure you discuss any questions you have with your health care provider.

## 2014-07-15 ENCOUNTER — Encounter: Payer: Self-pay | Admitting: Family Medicine

## 2014-07-15 NOTE — Assessment & Plan Note (Signed)
One on R arm, some early ones at temples  Can return for cryo tx any time

## 2014-07-15 NOTE — Assessment & Plan Note (Signed)
Disc goals for lipids and reasons to control them Rev labs with pt Rev low sat fat diet in detail On pravastatin high dose  Given diet handout

## 2014-07-15 NOTE — Assessment & Plan Note (Signed)
Lab Results  Component Value Date   PSA 2.53 07/07/2014   PSA 2.88 05/22/2013   PSA 2.17 04/08/2012   For yearly f/u with urology this year  No change in voiding symptoms

## 2014-07-15 NOTE — Assessment & Plan Note (Signed)
Reviewed health habits including diet and exercise and skin cancer prevention Reviewed appropriate screening tests for age  Also reviewed health mt list, fam hx and immunization status , as well as social and family history   See HPI Rev cholesterol and optimal diet Labs rev in detail

## 2015-02-22 ENCOUNTER — Telehealth: Payer: Self-pay | Admitting: Family Medicine

## 2015-02-22 ENCOUNTER — Telehealth: Payer: Self-pay | Admitting: *Deleted

## 2015-02-22 MED ORDER — OSELTAMIVIR PHOSPHATE 75 MG PO CAPS: 75.0000 mg | ORAL_CAPSULE | Freq: Every day | ORAL | Status: DC

## 2015-02-22 NOTE — Telephone Encounter (Signed)
I [facility] 7 d for prophylaxis

## 2015-02-22 NOTE — Telephone Encounter (Signed)
Pt said he was around her granddaughter (very close contact) this weekend on a family vacation, and yesterday she came down with a high fever and tested positive for the flu. 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 Pt's wife saw Dr. Silvio Pate today and he gave her tamiflu as a precaution.   Pt is requesting that we send in the generic tamiflu ([facility]eltamivir) [facility] [address] So he can take med also.  Pt aware Dr. Glori Bickers out of office and he is okay with waiting until tomorrow when she responds

## 2015-02-22 NOTE — Telephone Encounter (Signed)
Error

## 2015-02-23 NOTE — Telephone Encounter (Signed)
Left voicemail letting pt know Rx sent to pharmacy  

## 2015-02-24 ENCOUNTER — Telehealth: Payer: Self-pay | Admitting: Family Medicine

## 2015-02-24 MED ORDER — OSELTAMIVIR PHOSPHATE 75 MG PO CAPS: 75.0000 mg | ORAL_CAPSULE | Freq: Every day | ORAL | Status: DC

## 2015-02-24 NOTE — Addendum Note (Signed)
Addended by: Loura Pardon A on: 02/24/2015 08:30 PM   Modules accepted: Orders

## 2015-02-24 NOTE — Telephone Encounter (Signed)
The recommendation for prophylaxis is 7-10 days  I will send in an additional 3 days  If he develops any side effects- stop after 7

## 2015-02-24 NOTE — Telephone Encounter (Signed)
Patient called requesting additional tablets.  He picked up the prescription for Tamiflu for 7 days.  Patient states per the indication, the recommended dosage is 10 days.  He would like to have an additional 3 [facility].  Please advise.  (Patient is aware Dr. Glori Bickers is out of the office and may not respond today.)

## 2015-02-24 NOTE — Telephone Encounter (Signed)
Already left detailed voicemail letting pt know Rx sent to pharmacy

## 2015-02-24 NOTE — Telephone Encounter (Signed)
Pt returned your call, please call at work number listed [phone]. thanks

## 2015-02-25 NOTE — Telephone Encounter (Signed)
Left voicemail letting pt know Rx sent to pharmacy and advise of Dr. Marliss Coots recommendations

## 2015-02-25 NOTE — Telephone Encounter (Signed)
Marvin Pacheco at [facility] received 2 rx for tamiflu and wants to know if can combine the 2 rx for total quantity of # 10 so pt will not have to pay 2 copays of $50.00 each. Spoke with Shapale CMA and I called [facility] and gave permission for pt to get total of # 10 pills with one copay.

## 2015-07-08 ENCOUNTER — Telehealth: Payer: Self-pay | Admitting: Family Medicine

## 2015-07-08 DIAGNOSIS — Z0000 Encounter for general adult medical examination without abnormal findings: Secondary | ICD-10-CM

## 2015-07-08 DIAGNOSIS — Z125 Encounter for screening for malignant neoplasm of prostate: Secondary | ICD-10-CM

## 2015-07-08 DIAGNOSIS — R739 Hyperglycemia, unspecified: Secondary | ICD-10-CM

## 2015-07-08 DIAGNOSIS — E7800 Pure hypercholesterolemia, unspecified: Secondary | ICD-10-CM

## 2015-07-08 DIAGNOSIS — R7303 Prediabetes: Secondary | ICD-10-CM | POA: Insufficient documentation

## 2015-07-08 DIAGNOSIS — Z1159 Encounter for screening for other viral diseases: Secondary | ICD-10-CM

## 2015-07-08 NOTE — Addendum Note (Signed)
Addended by: Ellamae Sia on: 07/08/2015 08:07 PM   Modules accepted: Orders

## 2015-07-08 NOTE — Telephone Encounter (Signed)
-----   Message from Ellamae Sia sent at 07/08/2015 10:46 AM EDT ----- Regarding: Lab orders for Monday, 11.7.16 Patient is scheduled for CPX labs, please order future labs, Thanks , Terri , also wants A1C and Hep C

## 2015-07-12 ENCOUNTER — Other Ambulatory Visit (INDEPENDENT_AMBULATORY_CARE_PROVIDER_SITE_OTHER): Payer: 59

## 2015-07-12 ENCOUNTER — Other Ambulatory Visit: Payer: Self-pay | Admitting: Family Medicine

## 2015-07-12 DIAGNOSIS — Z125 Encounter for screening for malignant neoplasm of prostate: Secondary | ICD-10-CM

## 2015-07-12 DIAGNOSIS — E7800 Pure hypercholesterolemia, unspecified: Secondary | ICD-10-CM

## 2015-07-12 DIAGNOSIS — R739 Hyperglycemia, unspecified: Secondary | ICD-10-CM

## 2015-07-12 DIAGNOSIS — Z0000 Encounter for general adult medical examination without abnormal findings: Secondary | ICD-10-CM | POA: Diagnosis not present

## 2015-07-12 DIAGNOSIS — Z1159 Encounter for screening for other viral diseases: Secondary | ICD-10-CM

## 2015-07-12 LAB — CBC WITH DIFFERENTIAL/PLATELET
BASOS ABS: 0 10*3/uL (ref 0.0–0.1)
Basophils Relative: 0.6 % (ref 0.0–3.0)
EOS ABS: 0.4 10*3/uL (ref 0.0–0.7)
Eosinophils Relative: 5.5 % — ABNORMAL HIGH (ref 0.0–5.0)
HEMATOCRIT: 50.8 % (ref 39.0–52.0)
HEMOGLOBIN: 16.9 g/dL (ref 13.0–17.0)
LYMPHS PCT: 23.6 % (ref 12.0–46.0)
Lymphs Abs: 1.6 10*3/uL (ref 0.7–4.0)
MCHC: 33.3 g/dL (ref 30.0–36.0)
MCV: 94.4 fl (ref 78.0–100.0)
Monocytes Absolute: 0.6 10*3/uL (ref 0.1–1.0)
Monocytes Relative: 8.9 % (ref 3.0–12.0)
Neutro Abs: 4.2 10*3/uL (ref 1.4–7.7)
Neutrophils Relative %: 61.4 % (ref 43.0–77.0)
Platelets: 396 10*3/uL (ref 150.0–400.0)
RBC: 5.38 Mil/uL (ref 4.22–5.81)
RDW: 13.3 % (ref 11.5–15.5)
WBC: 6.9 10*3/uL (ref 4.0–10.5)

## 2015-07-12 LAB — COMPREHENSIVE METABOLIC PANEL
ALT: 30 U/L (ref 0–53)
AST: 25 U/L (ref 0–37)
Albumin: 4.4 g/dL (ref 3.5–5.2)
Alkaline Phosphatase: 54 U/L (ref 39–117)
BILIRUBIN TOTAL: 0.6 mg/dL (ref 0.2–1.2)
BUN: 15 mg/dL (ref 6–23)
CALCIUM: 9.7 mg/dL (ref 8.4–10.5)
CO2: 31 mEq/L (ref 19–32)
CREATININE: 0.85 mg/dL (ref 0.40–1.50)
Chloride: 101 mEq/L (ref 96–112)
GFR: 95.47 mL/min (ref >60.00)
Glucose, Bld: 108 mg/dL — ABNORMAL HIGH (ref 70–99)
Potassium: 4 mEq/L (ref 3.5–5.1)
Sodium: 139 mEq/L (ref 135–145)
TOTAL PROTEIN: 6.7 g/dL (ref 6.0–8.3)

## 2015-07-12 LAB — LIPID PANEL
CHOLESTEROL: 215 mg/dL — AB (ref 0–200)
HDL: 67.7 mg/dL (ref >39.00)
LDL CALC: 126 mg/dL — AB (ref 0–99)
NonHDL: 147.15
Total CHOL/HDL Ratio: 3
Triglycerides: 108 mg/dL (ref 0.0–149.0)
VLDL: 21.6 mg/dL (ref 0.0–40.0)

## 2015-07-12 LAB — TSH: TSH: 1.28 u[IU]/mL (ref 0.35–4.50)

## 2015-07-12 LAB — PSA: PSA: 2.28 ng/mL (ref 0.10–4.00)

## 2015-07-12 LAB — HEMOGLOBIN A1C: HEMOGLOBIN A1C: 5.4 % (ref 4.6–6.5)

## 2015-07-13 ENCOUNTER — Encounter: Payer: Self-pay | Admitting: *Deleted

## 2015-07-13 LAB — HEPATITIS C ANTIBODY: HCV AB: NEGATIVE

## 2015-07-19 ENCOUNTER — Other Ambulatory Visit: Payer: Self-pay | Admitting: Family Medicine

## 2015-07-19 ENCOUNTER — Encounter: Payer: Self-pay | Admitting: Family Medicine

## 2015-07-19 ENCOUNTER — Ambulatory Visit (INDEPENDENT_AMBULATORY_CARE_PROVIDER_SITE_OTHER): Payer: 59 | Admitting: Family Medicine

## 2015-07-19 VITALS — BP 118/66 | HR 67 | Temp 98.6°F | Ht 70.75 in | Wt 191.2 lb

## 2015-07-19 DIAGNOSIS — R739 Hyperglycemia, unspecified: Secondary | ICD-10-CM

## 2015-07-19 DIAGNOSIS — Z0000 Encounter for general adult medical examination without abnormal findings: Secondary | ICD-10-CM

## 2015-07-19 DIAGNOSIS — Z125 Encounter for screening for malignant neoplasm of prostate: Secondary | ICD-10-CM | POA: Diagnosis not present

## 2015-07-19 DIAGNOSIS — E7800 Pure hypercholesterolemia, unspecified: Secondary | ICD-10-CM | POA: Diagnosis not present

## 2015-07-19 MED ORDER — PRAVASTATIN SODIUM 80 MG PO TABS: ORAL_TABLET | ORAL | Status: DC

## 2015-07-19 NOTE — Assessment & Plan Note (Signed)
Reviewed health habits including diet and exercise and skin cancer prevention Reviewed appropriate screening tests for age  Also reviewed health mt list, fam hx and immunization status , as well as social and family history   See HPI Labs reviewed  Recommend derm visit for skin screen/some AKs and SKs Enc continued exercise  Rev ankle exercises to strengthen them also

## 2015-07-19 NOTE — Patient Instructions (Addendum)
Take care of yourself  Continue low sugar diet -stick with complex carbohydrates and eat protein when you eat carbs Labs look good  Keep exercising   Let us know later if you need a referral to dermatology for skin exam Wear sun protection when exposed

## 2015-07-19 NOTE — Progress Notes (Signed)
Pre visit review using our clinic review tool, if applicable. No additional management support is needed unless otherwise documented below in the visit note. 

## 2015-07-19 NOTE — Progress Notes (Signed)
Subjective:    Patient ID: Marvin Pacheco, male    DOB: 1946-04-16, 69 y.o.   MRN: [id number]  HPI Here for health maintenance exam and to review chronic medical problems  (not medicare)  He mentions he tends to twist his ankles a lot  [place] Pall /dancing   Wt is down 6 lb with bmi of 26 Is taking care of himself  Ramping up exercise gradually- walks every day Careful with what he eats   Flu vaccine - rec one at [facility]   colonosc 1/14 adenoma - re check 5 y  Tdap  2/12  Zoster vaccine 3/12  PNA 10/15 vaccine (complete on both)  Hep c screen neg this draw  Hyperglycemia Lab Results  Component Value Date   HGBA1C 5.4 07/12/2015  very good  Diet controlled - watching sugar in his diet (sugar makes him sleepy)  Eating complex carbs  Wt loss is going well   Prostate ca screen Lab Results  Component Value Date   PSA 2.28 07/12/2015   PSA 2.53 07/07/2014   PSA 2.88 05/22/2013   no trouble with urination  Stable psa  No nocturia  He continues to see urol at [facility] for BPH  AAA watching  Cholesterol Lab Results  Component Value Date   CHOL 215* 07/12/2015   CHOL 227* 07/07/2014   CHOL 217* 05/22/2013   Lab Results  Component Value Date   HDL 67.70 07/12/2015   HDL 67.70 07/07/2014   HDL 67.40 05/22/2013   Lab Results  Component Value Date   LDLCALC 126* 07/12/2015   LDLCALC 129* 07/07/2014   LDLCALC 117* 10/31/2010   Lab Results  Component Value Date   TRIG 108.0 07/12/2015   TRIG 152.0* 07/07/2014   TRIG 162.0* 05/22/2013   Lab Results  Component Value Date   CHOLHDL 3 07/12/2015   CHOLHDL 3 07/07/2014   CHOLHDL 3 05/22/2013   Lab Results  Component Value Date   LDLDIRECT 129.8 05/22/2013   LDLDIRECT 106.2 04/08/2012   LDLDIRECT 142.7 10/19/2009   not bad - fairly well controlled with diet  Eats a lot of salad and low sodium deli meat   Patient Active Problem List   Diagnosis Date Noted  . Hyperglycemia 07/08/2015  . Need for  hepatitis C screening test 07/08/2015  . Actinic keratoses 07/14/2014  . Aortic aneurysm ([place]) 07/14/2014  . Personal history of colonic adenoma 09/18/2012  . Abnormal prostate exam 09/17/2012  . Colon cancer screening 05/22/2012  . Meniere disease 01/15/2012  . ANA positive 01/15/2012  . Adverse effects of medication 01/15/2012  . Vertigo 12/15/2011  . Atrial flutter ([place]) 12/15/2011  . Routine general medical examination at a health care facility 10/03/2011  . Prostate cancer screening 10/03/2011  . ALLERGIC RHINITIS 04/08/2008  . HYPERCHOLESTEROLEMIA 01/09/2008   Past Medical History  Diagnosis Date  . Meniere disease   . Seasonal allergies   . Cataract   . Hyperlipidemia   . Atrial flutter ([place])   . Aortic aneurysm ([facility])     4 cm- watched by cardiology per pt   Past Surgical History  Procedure Laterality Date  . Bilateral inguinal hernia  1990  . Revision hernia      2000 and 2006. Revision of inguinal hernia  . Lipoma abdomen  2000   Social History  Substance Use Topics  . Smoking status: Never Smoker   . Smokeless tobacco: Never Used  . Alcohol Use: No     Comment: social  Family History  Problem Relation Age of Onset  . Colon cancer Neg Hx   . Stomach cancer Neg Hx   . Abnormal newborn screen Sister    No Known Allergies Current Outpatient Prescriptions on File Prior to Visit  Medication Sig Dispense Refill  . Ascorbic Acid (VITAMIN C) 1000 MG tablet Take 1,000 mg by mouth daily.    Marland Kitchen aspirin 81 MG chewable tablet Chew 81 mg by mouth daily.    . calcium gluconate 650 MG tablet Take 650 mg by mouth daily.    . cholecalciferol (VITAMIN D) 1000 UNITS tablet Take 4,000 Units by mouth daily.    Marland Kitchen co-enzyme Q-10 50 MG capsule Take 100 mg by mouth daily.    . fish oil-omega-3 fatty acids 1000 MG capsule Take 1 g by mouth daily.    . fluticasone (FLONASE) 50 MCG/ACT nasal spray USE 2 SPRAYS IN EACH NOSTRIL EVERY DAY 16 g 5  . hydrochlorothiazide (HYDRODIURIL)  50 MG tablet Take 50 mg by mouth daily.    . Multiple Vitamin (MULTIVITAMIN) capsule Take 1 capsule by mouth daily.    . Potassium Chloride Crys CR (KLOR-CON M20 PO) Take 1 tablet by mouth daily.    . pravastatin (PRAVACHOL) 80 MG tablet TAKE 1 TABLET BY MOUTH DAILY 30 tablet 11  . verapamil (CALAN) 80 MG tablet Take 1 capsule by mouth every 12 (twelve) hours.     No current facility-administered medications on file prior to visit.      Review of Systems Review of Systems  Constitutional: Negative for fever, appetite change, fatigue and unexpected weight change.  Eyes: Negative for pain and visual disturbance.  Respiratory: Negative for cough and shortness of breath.   Cardiovascular: Negative for cp or palpitations    Gastrointestinal: Negative for nausea, diarrhea and constipation.  Genitourinary: Negative for urgency and frequency.  Skin: Negative for pallor or rash   MSK pos for generally weak ankle  Neurological: Negative for weakness, light-headedness, numbness and headaches.  Hematological: Negative for adenopathy. Does not bruise/bleed easily.  Psychiatric/Behavioral: Negative for dysphoric mood. The patient is not nervous/anxious.         Objective:   Physical Exam  Constitutional: He appears well-developed and well-nourished. No distress.  overwt and well appearing   HENT:  Head: Normocephalic and atraumatic.  Right Ear: External ear normal.  Left Ear: External ear normal.  Nose: Nose normal.  Mouth/Throat: Oropharynx is clear and moist.  Eyes: Conjunctivae and EOM are normal. Pupils are equal, round, and reactive to light. Right eye exhibits no discharge. Left eye exhibits no discharge. No scleral icterus.  Neck: Normal range of motion. Neck supple. No JVD present. Carotid bruit is not present. No thyromegaly present.  Cardiovascular: Normal rate, regular rhythm, normal heart sounds and intact distal pulses.  Exam reveals no gallop.   Pulmonary/Chest: Effort normal  and breath sounds normal. No respiratory distress. He has no wheezes. He exhibits no tenderness.  Abdominal: Soft. Bowel sounds are normal. He exhibits no distension, no abdominal bruit and no mass. There is no tenderness.  Musculoskeletal: He exhibits no edema or tenderness.  Lymphadenopathy:    He has no cervical adenopathy.  Neurological: He is alert. He has normal reflexes. No cranial nerve deficit. He exhibits normal muscle tone. Coordination normal.  Skin: Skin is warm and dry. No rash noted. No erythema. No pallor.  Psychiatric: He has a normal mood and affect.          Assessment & Plan:  Problem List Items Addressed This Visit      Other   HYPERCHOLESTEROLEMIA - Primary    Disc goals for lipids and reasons to control them Rev labs with pt Rev low sat fat diet in detail  Will continue pravastatin       Relevant Medications   pravastatin (PRAVACHOL) 80 MG tablet   Hyperglycemia    Lab Results  Component Value Date   HGBA1C 5.4 07/12/2015   Stable Disc low glycemic diet/exercise and wt control to avoid DM      Prostate cancer screening    Lab Results  Component Value Date   PSA 2.28 07/12/2015   PSA 2.53 07/07/2014   PSA 2.88 05/22/2013   No new symptoms Continues urol f/u for BPH      Routine general medical examination at a health care facility    Reviewed health habits including diet and exercise and skin cancer prevention Reviewed appropriate screening tests for age  Also reviewed health mt list, fam hx and immunization status , as well as social and family history   See HPI Labs reviewed  Recommend derm visit for skin screen/some AKs and SKs Enc continued exercise  Rev ankle exercises to strengthen them also

## 2015-07-19 NOTE — Assessment & Plan Note (Signed)
Lab Results  Component Value Date   HGBA1C 5.4 07/12/2015   Stable Disc low glycemic diet/exercise and wt control to avoid DM

## 2015-07-19 NOTE — Assessment & Plan Note (Signed)
Lab Results  Component Value Date   PSA 2.28 07/12/2015   PSA 2.53 07/07/2014   PSA 2.88 05/22/2013   No new symptoms Continues urol f/u for BPH

## 2015-07-19 NOTE — Assessment & Plan Note (Signed)
Disc goals for lipids and reasons to control them Rev labs with pt Rev low sat fat diet in detail  Will continue pravastatin

## 2015-10-14 ENCOUNTER — Ambulatory Visit (INDEPENDENT_AMBULATORY_CARE_PROVIDER_SITE_OTHER): Payer: 59 | Admitting: Internal Medicine

## 2015-10-14 ENCOUNTER — Encounter: Payer: Self-pay | Admitting: Internal Medicine

## 2015-10-14 VITALS — BP 120/80 | HR 75 | Temp 98.5°F | Wt 191.0 lb

## 2015-10-14 DIAGNOSIS — J069 Acute upper respiratory infection, unspecified: Secondary | ICD-10-CM | POA: Diagnosis not present

## 2015-10-14 MED ORDER — AZITHROMYCIN 250 MG PO TABS: ORAL_TABLET | ORAL | Status: DC

## 2015-10-14 MED ORDER — HYDROCODONE-HOMATROPINE 5-1.5 MG/5ML PO SYRP: 5.0000 mL | ORAL_SOLUTION | Freq: Every evening | ORAL | Status: DC | PRN

## 2015-10-14 MED ORDER — METHYLPREDNISOLONE ACETATE 80 MG/ML IJ SUSP
80.0000 mg | Freq: Once | INTRAMUSCULAR | Status: AC
  Administered 2015-10-14: 80 mg via INTRAMUSCULAR

## 2015-10-14 NOTE — Patient Instructions (Signed)

## 2015-10-14 NOTE — Addendum Note (Signed)
Addended by: Lurlean Nanny on: 10/14/2015 04:32 PM   Modules accepted: Orders

## 2015-10-14 NOTE — Progress Notes (Signed)
Pre visit review using our clinic review tool, if applicable. No additional management support is needed unless otherwise documented below in the visit note. 

## 2015-10-14 NOTE — Progress Notes (Signed)
HPI  Pt presents to the clinic today with c/o headache, nasal congestion, and cough. This started 10 days ago. He is blowing clear mucous out of his nose. The cough is mostly nonproductive. When he can get something up, it is green. He denies fever, chills or body aches. He has tried Mucinex, Claritin and Flonase with minimal relief. He does have a history of seasonal allergies. He has not had sick contacts.  Review of Systems    Past Medical History  Diagnosis Date  . Meniere disease   . Seasonal allergies   . Cataract   . Hyperlipidemia   . Atrial flutter ([place])   . Aortic aneurysm ([facility])     4 cm- watched by cardiology per pt    Family History  Problem Relation Age of Onset  . Colon cancer Neg Hx   . Stomach cancer Neg Hx   . Abnormal newborn screen Sister     Social History   Social History  . Marital Status: Married    Spouse Name: N/A  . Number of Children: N/A  . Years of Education: N/A   Occupational History  . Not on file.   Social History Main Topics  . Smoking status: Never Smoker   . Smokeless tobacco: Never Used  . Alcohol Use: No     Comment: social  . Drug Use: No  . Sexual Activity: Not on file   Other Topics Concern  . Not on file   Social History Narrative    No Known Allergies   Constitutional: Positive headache. Denies fatigue, fever or abrupt weight changes.  HEENT:  Positive nasal congestion and sore throat. Denies eye redness, ear pain, ringing in the ears, wax buildup, runny nose or bloody nose. Respiratory: Positive cough. Denies difficulty breathing or shortness of breath.  Cardiovascular: Denies chest pain, chest tightness, palpitations or swelling in the hands or feet.   No other specific complaints in a complete review of systems (except as listed in HPI above).  Objective:  BP 120/80 mmHg  Pulse 75  Temp(Src) 98.5 F (36.9 C) (Oral)  Wt 191 lb (86.637 kg)  SpO2 98%   General: Appears his stated age, in NAD. HEENT: Head:  normal shape and size, no sinus tenderness noted; Eyes: sclera white, no icterus, conjunctiva pink; Ears: Tm's pink but intact, normal light reflex; Nose: mucosa boggy and moist, septum midline; Throat/Mouth: + PND. Teeth present, mucosa pink and moist, no exudate noted, no lesions or ulcerations noted.  Neck:  No adenopathy noted.  Cardiovascular: Normal rate and rhythm. S1,S2 noted.  No murmur, rubs or gallops noted.  Pulmonary/Chest: Normal effort and positive vesicular breath sounds. No respiratory distress. No wheezes, rales or ronchi noted.      Assessment & Plan:  Upper respiratory infection  ? Allergy component to this as well Can use a Neti Pot which can be purchased from your local drug store. Continue Claritin and Flonase 80 mg Depo IM today eRx for Azithromax x 5 days Rx for Hycodan for cough  RTC as needed or if symptoms persist.

## 2016-02-28 ENCOUNTER — Telehealth: Payer: Self-pay | Admitting: Family Medicine

## 2016-02-28 NOTE — Telephone Encounter (Signed)
Letter written and handed to [facility] Please scan forms for chart.

## 2016-02-28 NOTE — Telephone Encounter (Signed)
Pt dropped off [employer] paperwork for approval/signature.  Pt understands that PCP is out of office until 03/08/16 and we will give to another provider for evaluation.  Paperwork placed on [employer].  Best number to call pt is [phone]

## 2016-02-28 NOTE — Telephone Encounter (Signed)
Copy was made to be scanned in.

## 2016-02-28 NOTE — Telephone Encounter (Signed)
Spoke to pt advising paperwork was ready for pickup. [employer] has the paperwork. No charge per Dr Danise Mina.

## 2016-06-02 ENCOUNTER — Ambulatory Visit (INDEPENDENT_AMBULATORY_CARE_PROVIDER_SITE_OTHER): Payer: 59

## 2016-06-02 DIAGNOSIS — Z23 Encounter for immunization: Secondary | ICD-10-CM

## 2016-07-12 ENCOUNTER — Other Ambulatory Visit: Payer: Self-pay | Admitting: Family Medicine

## 2016-07-13 ENCOUNTER — Telehealth: Payer: Self-pay | Admitting: Family Medicine

## 2016-07-13 ENCOUNTER — Other Ambulatory Visit: Payer: 59

## 2016-07-13 DIAGNOSIS — Z0000 Encounter for general adult medical examination without abnormal findings: Secondary | ICD-10-CM

## 2016-07-13 DIAGNOSIS — R739 Hyperglycemia, unspecified: Secondary | ICD-10-CM

## 2016-07-13 DIAGNOSIS — Z125 Encounter for screening for malignant neoplasm of prostate: Secondary | ICD-10-CM

## 2016-07-13 NOTE — Telephone Encounter (Signed)
-----   Message from Marchia Bond sent at 07/06/2016 11:09 AM EDT ----- Regarding: Cpx labs Thurs, Nov 9, need orders. Thanks! :-) Please order  future cpx labs for pt's upcoming lab appt. Thanks Aniceto Boss

## 2016-07-14 ENCOUNTER — Other Ambulatory Visit (INDEPENDENT_AMBULATORY_CARE_PROVIDER_SITE_OTHER): Payer: 59

## 2016-07-14 DIAGNOSIS — Z0000 Encounter for general adult medical examination without abnormal findings: Secondary | ICD-10-CM

## 2016-07-14 DIAGNOSIS — Z125 Encounter for screening for malignant neoplasm of prostate: Secondary | ICD-10-CM | POA: Diagnosis not present

## 2016-07-14 DIAGNOSIS — R739 Hyperglycemia, unspecified: Secondary | ICD-10-CM | POA: Diagnosis not present

## 2016-07-14 LAB — CBC WITH DIFFERENTIAL/PLATELET
Basophils Absolute: 0.1 10*3/uL (ref 0.0–0.1)
Basophils Relative: 0.8 % (ref 0.0–3.0)
Eosinophils Absolute: 0.3 10*3/uL (ref 0.0–0.7)
Eosinophils Relative: 4.7 % (ref 0.0–5.0)
HCT: 48.9 % (ref 39.0–52.0)
Hemoglobin: 16.8 g/dL (ref 13.0–17.0)
Lymphocytes Relative: 25.8 % (ref 12.0–46.0)
Lymphs Abs: 1.8 10*3/uL (ref 0.7–4.0)
MCHC: 34.4 g/dL (ref 30.0–36.0)
MCV: 92.2 fl (ref 78.0–100.0)
Monocytes Absolute: 0.7 10*3/uL (ref 0.1–1.0)
Monocytes Relative: 9.6 % (ref 3.0–12.0)
Neutro Abs: 4.1 10*3/uL (ref 1.4–7.7)
Neutrophils Relative %: 59.1 % (ref 43.0–77.0)
Platelets: 379 10*3/uL (ref 150.0–400.0)
RBC: 5.3 Mil/uL (ref 4.22–5.81)
RDW: 13.1 % (ref 11.5–15.5)
WBC: 6.9 10*3/uL (ref 4.0–10.5)

## 2016-07-14 LAB — LIPID PANEL
CHOL/HDL RATIO: 3
Cholesterol: 223 mg/dL — ABNORMAL HIGH (ref 0–200)
HDL: 80.5 mg/dL (ref >39.00)
LDL Cholesterol: 121 mg/dL — ABNORMAL HIGH (ref 0–99)
NONHDL: 142.93
Triglycerides: 108 mg/dL (ref 0.0–149.0)
VLDL: 21.6 mg/dL (ref 0.0–40.0)

## 2016-07-14 LAB — COMPREHENSIVE METABOLIC PANEL WITH GFR
ALT: 26 U/L (ref 0–53)
AST: 21 U/L (ref 0–37)
Albumin: 4.4 g/dL (ref 3.5–5.2)
Alkaline Phosphatase: 52 U/L (ref 39–117)
BUN: 18 mg/dL (ref 6–23)
CO2: 33 meq/L — ABNORMAL HIGH (ref 19–32)
Calcium: 9.4 mg/dL (ref 8.4–10.5)
Chloride: 98 meq/L (ref 96–112)
Creatinine, Ser: 0.78 mg/dL (ref 0.40–1.50)
GFR: 105.11 mL/min
Glucose, Bld: 103 mg/dL — ABNORMAL HIGH (ref 70–99)
Potassium: 4.3 meq/L (ref 3.5–5.1)
Sodium: 138 meq/L (ref 135–145)
Total Bilirubin: 0.7 mg/dL (ref 0.2–1.2)
Total Protein: 6.6 g/dL (ref 6.0–8.3)

## 2016-07-14 LAB — TSH: TSH: 0.94 u[IU]/mL (ref 0.35–4.50)

## 2016-07-14 LAB — HEMOGLOBIN A1C: Hgb A1c MFr Bld: 5.5 % (ref 4.6–6.5)

## 2016-07-14 LAB — PSA: PSA: 2.62 ng/mL (ref 0.10–4.00)

## 2016-07-19 ENCOUNTER — Encounter: Payer: 59 | Admitting: Family Medicine

## 2016-08-21 ENCOUNTER — Ambulatory Visit (INDEPENDENT_AMBULATORY_CARE_PROVIDER_SITE_OTHER): Payer: 59 | Admitting: Family Medicine

## 2016-08-21 ENCOUNTER — Encounter: Payer: Self-pay | Admitting: Family Medicine

## 2016-08-21 VITALS — BP 130/70 | HR 94 | Temp 99.3°F | Ht 70.5 in | Wt 193.2 lb

## 2016-08-21 DIAGNOSIS — E7800 Pure hypercholesterolemia, unspecified: Secondary | ICD-10-CM | POA: Diagnosis not present

## 2016-08-21 DIAGNOSIS — I4892 Unspecified atrial flutter: Secondary | ICD-10-CM | POA: Diagnosis not present

## 2016-08-21 DIAGNOSIS — Z125 Encounter for screening for malignant neoplasm of prostate: Secondary | ICD-10-CM

## 2016-08-21 DIAGNOSIS — H8109 Meniere's disease, unspecified ear: Secondary | ICD-10-CM

## 2016-08-21 DIAGNOSIS — I719 Aortic aneurysm of unspecified site, without rupture: Secondary | ICD-10-CM | POA: Diagnosis not present

## 2016-08-21 DIAGNOSIS — Z0000 Encounter for general adult medical examination without abnormal findings: Secondary | ICD-10-CM | POA: Diagnosis not present

## 2016-08-21 DIAGNOSIS — R0683 Snoring: Secondary | ICD-10-CM | POA: Insufficient documentation

## 2016-08-21 DIAGNOSIS — R739 Hyperglycemia, unspecified: Secondary | ICD-10-CM

## 2016-08-21 MED ORDER — PRAVASTATIN SODIUM 80 MG PO TABS: 80.0000 mg | ORAL_TABLET | Freq: Every day | ORAL | 3 refills | Status: DC

## 2016-08-21 NOTE — Assessment & Plan Note (Signed)
Disc goals for lipids and reasons to control them Rev labs with pt Rev low sat fat diet in detail On max dose of pravastatin  Overall stable with increased HDL

## 2016-08-21 NOTE — Assessment & Plan Note (Signed)
Good control with diet and exercise  Lab Results  Component Value Date   HGBA1C 5.5 07/14/2016   Wt loss/ mt enc

## 2016-08-21 NOTE — Assessment & Plan Note (Signed)
Reviewed health habits including diet and exercise and skin cancer prevention Reviewed appropriate screening tests for age  Also reviewed health mt list, fam hx and immunization status , as well as social and family history   See HPI Labs reviewed -stable Disc sun protection and dermatologic checks Recommend wt loss to prevent /help snoring and sleep apnea

## 2016-08-21 NOTE — Assessment & Plan Note (Signed)
Doing well lately- sees ENT  On compounded betahistine

## 2016-08-21 NOTE — Assessment & Plan Note (Signed)
Recent echo- for cardiology f/u in January No symptoms

## 2016-08-21 NOTE — Assessment & Plan Note (Signed)
Lab Results  Component Value Date   PSA 2.62 07/14/2016   PSA 2.28 07/12/2015   PSA 2.53 07/07/2014   No change in symptoms of BPH He will f/u with urology next mo as planned

## 2016-08-21 NOTE — Assessment & Plan Note (Signed)
No episodes in the past year

## 2016-08-21 NOTE — Patient Instructions (Addendum)
If you have snoring and sleep apnea - weight loss will help  Also try to keep from rolling on your back  If no improvement in the future- call us and we will refer you to a sleep clinic for further evaluation   Seeing a dermatologist every 2-3 years is good idea Try not to forget sun protection   Eat a healthy diet and get back to exercise   Labs are stable No change in medication

## 2016-08-21 NOTE — Progress Notes (Signed)
Subjective:    Patient ID: Marvin Pacheco, male    DOB: 11/17/1945, 70 y.o.   MRN: [id number]  HPI Here for health maintenance exam and to review chronic medical problems    Pt is not using medicare currently   Mentions that his wife insists he has sleep apnea  When he sleeps on his back - he snores and has had witnessed apnea episodes Uses flonase  He can be tired during the day  Dozes easily if he sits in a quiet area  Does not feel un rested  This improves when he looses wt  20 y ago -he had some sleep apnea-not bad enough for sleep   Has some spots on his face to check  Mole on L temple- crumbles at times  Last saw derm ? 2 years ago - Dr Donnamae Jude Readings from Last 3 Encounters:  08/21/16 193 lb 4 oz (87.7 kg)  10/14/15 191 lb (86.6 kg)  07/19/15 191 lb 4 oz (86.8 kg)  eating a healthy diet  Gets a fair amt of exercise- likes to walk / was going to the gym until school started/ will get back on track  bmi is 27.3  No plans for retirement -will draw ss at 33 / will keep working   Colonoscopy 1/14 5 year recall for adenoma   Tetanus shot 2/12  Zoster vaccine 3/12  Completed pneumonia vaccines   Hep C screen neg 11/16  Flu shot 9/17  Prostate cancer screen Lab Results  Component Value Date   PSA 2.62 07/14/2016   PSA 2.28 07/12/2015   PSA 2.53 07/07/2014   hx of BPH - he sees urology in [place] or feb More nocturia if he drinks fluids right before bed  occ slow urination- usually stream is good   Brief hx of a flutter in the past  No problems   Hx of aortic aneurysm watched yearly  Had echocardiogram in the fall - follows up in [place]  Was initially 4 cm   BP Readings from Last 3 Encounters:  08/21/16 130/70  10/14/15 120/80  07/19/15 118/66      Hx of hyperlipidemia Lab Results  Component Value Date   CHOL 223 (H) 07/14/2016   CHOL 215 (H) 07/12/2015   CHOL 227 (H) 07/07/2014   Lab Results  Component Value Date   HDL 80.50  07/14/2016   HDL 67.70 07/12/2015   HDL 67.70 07/07/2014   Lab Results  Component Value Date   LDLCALC 121 (H) 07/14/2016   LDLCALC 126 (H) 07/12/2015   LDLCALC 129 (H) 07/07/2014   Lab Results  Component Value Date   TRIG 108.0 07/14/2016   TRIG 108.0 07/12/2015   TRIG 152.0 (H) 07/07/2014   Lab Results  Component Value Date   CHOLHDL 3 07/14/2016   CHOLHDL 3 07/12/2015   CHOLHDL 3 07/07/2014   Lab Results  Component Value Date   LDLDIRECT 129.8 05/22/2013   LDLDIRECT 106.2 04/08/2012   LDLDIRECT 142.7 10/19/2009  on pravastatin and diet  He is doing some exercise - walking  Overall stable with improved HDL  He avoids junk food and processed food in general  [employer] very seldom  No sausage or bacon   Has to avoid sodium for meniere's as well  On a compounded - beta histine -and this has helped a lot  One small episode 3 wk ago  Stress is a big trigger    Hx of  hyperlipidemia Lab Results  Component Value Date   HGBA1C 5.5 07/14/2016   This is stable and well controlled  Doing a good job with diet   Patient Active Problem List   Diagnosis Date Noted  . Snoring 08/21/2016  . Hyperglycemia 07/08/2015  . Need for hepatitis C screening test 07/08/2015  . Actinic keratoses 07/14/2014  . Aortic aneurysm ([place]) 07/14/2014  . Personal history of colonic adenoma 09/18/2012  . Abnormal prostate exam 09/17/2012  . Colon cancer screening 05/22/2012  . Meniere disease 01/15/2012  . ANA positive 01/15/2012  . Adverse effects of medication 01/15/2012  . Vertigo 12/15/2011  . Atrial flutter ([place]) 12/15/2011  . Routine general medical examination at a health care facility 10/03/2011  . Prostate cancer screening 10/03/2011  . ALLERGIC RHINITIS 04/08/2008  . HYPERCHOLESTEROLEMIA 01/09/2008   Past Medical History:  Diagnosis Date  . Aortic aneurysm (HCC)    4 cm- watched by cardiology per pt  . Atrial flutter ([place])   . Cataract   . Hyperlipidemia   . Meniere  disease   . Seasonal allergies    Past Surgical History:  Procedure Laterality Date  . bilateral inguinal hernia  1990  . lipoma abdomen  2000  . revision hernia     2000 and 2006. Revision of inguinal hernia   Social History  Substance Use Topics  . Smoking status: Never Smoker  . Smokeless tobacco: Never Used  . Alcohol use No     Comment: social   Family History  Problem Relation Age of Onset  . Colon cancer Neg Hx   . Stomach cancer Neg Hx   . Abnormal newborn screen Sister    No Known Allergies Current Outpatient Prescriptions on File Prior to Visit  Medication Sig Dispense Refill  . Ascorbic Acid (VITAMIN C) 1000 MG tablet Take 1,000 mg by mouth daily.    Marland Kitchen aspirin 81 MG chewable tablet Chew 81 mg by mouth daily.    . calcium gluconate 650 MG tablet Take 650 mg by mouth daily.    . cholecalciferol (VITAMIN D) 1000 UNITS tablet Take 4,000 Units by mouth daily.    Marland Kitchen co-enzyme Q-10 50 MG capsule Take 100 mg by mouth daily.    . fish oil-omega-3 fatty acids 1000 MG capsule Take 1 g by mouth daily.    . fluticasone (FLONASE) 50 MCG/ACT nasal spray USE 2 SPRAYS IN EACH NOSTRIL EVERY DAY 16 g 5  . hydrochlorothiazide (HYDRODIURIL) 50 MG tablet Take 50 mg by mouth daily.    . Multiple Vitamin (MULTIVITAMIN) capsule Take 1 capsule by mouth daily.    . Potassium Chloride Crys CR (KLOR-CON M20 PO) Take 1 tablet by mouth daily.    . verapamil (CALAN) 80 MG tablet Take 1 capsule by mouth every 12 (twelve) hours.     No current facility-administered medications on file prior to visit.      Review of Systems Review of Systems  Constitutional: Negative for fever, appetite change,  and unexpected weight change. pos for sleepiness ENT pos for snoring  Eyes: Negative for pain and visual disturbance.  Respiratory: Negative for cough and shortness of breath.   Cardiovascular: Negative for cp or palpitations    Gastrointestinal: Negative for nausea, diarrhea and constipation.    Genitourinary: Negative for urgency and frequency.  Skin: Negative for pallor or rash   Neurological: Negative for weakness, light-headedness, numbness and headaches. (pos for meniere's dz)  Hematological: Negative for adenopathy. Does not bruise/bleed easily.  Psychiatric/Behavioral:  Negative for dysphoric mood. The patient is not nervous/anxious.         Objective:   Physical Exam  Constitutional: He appears well-developed and well-nourished. No distress.  overwt and well app   HENT:  Head: Normocephalic and atraumatic.  Right Ear: External ear normal.  Left Ear: External ear normal.  Nose: Nose normal.  Mouth/Throat: Oropharynx is clear and moist.  Eyes: Conjunctivae and EOM are normal. Pupils are equal, round, and reactive to light. Right eye exhibits no discharge. Left eye exhibits no discharge. No scleral icterus.  Neck: Normal range of motion. Neck supple. No JVD present. Carotid bruit is not present. No thyromegaly present.  Cardiovascular: Normal rate, regular rhythm, normal heart sounds and intact distal pulses.  Exam reveals no gallop.   Pulmonary/Chest: Effort normal and breath sounds normal. No respiratory distress. He has no wheezes. He exhibits no tenderness.  Abdominal: Soft. Bowel sounds are normal. He exhibits no distension, no abdominal bruit and no mass. There is no tenderness.  Genitourinary:  Genitourinary Comments: Pt gets DRE at the urologist  Musculoskeletal: He exhibits no edema or tenderness.  No kyphosis No acute joint changes   Lymphadenopathy:    He has no cervical adenopathy.  Neurological: He is alert. He has normal reflexes. No cranial nerve deficit. He exhibits normal muscle tone. Coordination normal.  Skin: Skin is warm and dry. No rash noted. No erythema. No pallor.  Fungal changes of bilat great toe nails  Psychiatric: He has a normal mood and affect.          Assessment & Plan:   Problem List Items Addressed This Visit       Cardiovascular and Mediastinum   Atrial flutter ([place]) - Primary    No episodes in the past year      Relevant Medications   pravastatin (PRAVACHOL) 80 MG tablet   Aortic aneurysm (HCC)    Recent echo- for cardiology f/u in January No symptoms       Relevant Medications   pravastatin (PRAVACHOL) 80 MG tablet     Nervous and Auditory   Meniere disease    Doing well lately- sees ENT  On compounded betahistine        Other   Snoring    If no improvement with wt loss would recommend formal sleep evaluation  Disc s/s and risks of sleep apnea He does easily doze off      Routine general medical examination at a health care facility    Reviewed health habits including diet and exercise and skin cancer prevention Reviewed appropriate screening tests for age  Also reviewed health mt list, fam hx and immunization status , as well as social and family history   See HPI Labs reviewed -stable Disc sun protection and dermatologic checks Recommend wt loss to prevent /help snoring and sleep apnea       Prostate cancer screening    Lab Results  Component Value Date   PSA 2.62 07/14/2016   PSA 2.28 07/12/2015   PSA 2.53 07/07/2014   No change in symptoms of BPH He will f/u with urology next mo as planned       Hyperglycemia    Good control with diet and exercise  Lab Results  Component Value Date   HGBA1C 5.5 07/14/2016   Wt loss/ mt enc      HYPERCHOLESTEROLEMIA    Disc goals for lipids and reasons to control them Rev labs with pt Rev low sat fat diet  in detail On max dose of pravastatin  Overall stable with increased HDL       Relevant Medications   pravastatin (PRAVACHOL) 80 MG tablet

## 2016-08-21 NOTE — Assessment & Plan Note (Signed)
If no improvement with wt loss would recommend formal sleep evaluation  Disc s/s and risks of sleep apnea He does easily doze off

## 2016-12-30 ENCOUNTER — Other Ambulatory Visit: Payer: Self-pay | Admitting: Family Medicine

## 2017-03-27 ENCOUNTER — Telehealth: Payer: Self-pay

## 2017-03-27 NOTE — Telephone Encounter (Signed)
Pt got a scratch on a bench on 03/23/17 and is almost healed now but pt wants to know status of tetanus shot; advised per immunization record last tdap 11/02/10. Pt will ck with ins co to see if cover tetanus shot for scratch. Pt will ck with pharmacy and or cb for nurse visit appt if needed.

## 2017-08-16 ENCOUNTER — Telehealth: Payer: Self-pay | Admitting: Family Medicine

## 2017-08-16 DIAGNOSIS — Z0000 Encounter for general adult medical examination without abnormal findings: Secondary | ICD-10-CM

## 2017-08-16 DIAGNOSIS — R739 Hyperglycemia, unspecified: Secondary | ICD-10-CM

## 2017-08-16 DIAGNOSIS — E7800 Pure hypercholesterolemia, unspecified: Secondary | ICD-10-CM

## 2017-08-16 DIAGNOSIS — Z125 Encounter for screening for malignant neoplasm of prostate: Secondary | ICD-10-CM

## 2017-08-16 NOTE — Telephone Encounter (Signed)
-----   Message from Ellamae Sia sent at 08/16/2017  3:26 PM EST ----- Regarding: Lab orders for Friday, 12.21.18 Patient is scheduled for CPX labs, please order future labs, Thanks , Karna Christmas

## 2017-08-24 ENCOUNTER — Other Ambulatory Visit (INDEPENDENT_AMBULATORY_CARE_PROVIDER_SITE_OTHER): Payer: 59

## 2017-08-24 DIAGNOSIS — Z0000 Encounter for general adult medical examination without abnormal findings: Secondary | ICD-10-CM

## 2017-08-24 DIAGNOSIS — E7800 Pure hypercholesterolemia, unspecified: Secondary | ICD-10-CM | POA: Diagnosis not present

## 2017-08-24 DIAGNOSIS — R739 Hyperglycemia, unspecified: Secondary | ICD-10-CM

## 2017-08-24 DIAGNOSIS — Z125 Encounter for screening for malignant neoplasm of prostate: Secondary | ICD-10-CM

## 2017-08-24 LAB — CBC WITH DIFFERENTIAL/PLATELET
BASOS ABS: 0.1 10*3/uL (ref 0.0–0.1)
Basophils Relative: 1.3 % (ref 0.0–3.0)
EOS PCT: 5 % (ref 0.0–5.0)
Eosinophils Absolute: 0.3 10*3/uL (ref 0.0–0.7)
HEMATOCRIT: 47.4 % (ref 39.0–52.0)
HEMOGLOBIN: 16.2 g/dL (ref 13.0–17.0)
LYMPHS ABS: 1.6 10*3/uL (ref 0.7–4.0)
LYMPHS PCT: 25.6 % (ref 12.0–46.0)
MCHC: 34.2 g/dL (ref 30.0–36.0)
MCV: 94.7 fl (ref 78.0–100.0)
MONOS PCT: 10.8 % (ref 3.0–12.0)
Monocytes Absolute: 0.7 10*3/uL (ref 0.1–1.0)
Neutro Abs: 3.5 10*3/uL (ref 1.4–7.7)
Neutrophils Relative %: 57.3 % (ref 43.0–77.0)
Platelets: 446 10*3/uL — ABNORMAL HIGH (ref 150.0–400.0)
RBC: 5.01 Mil/uL (ref 4.22–5.81)
RDW: 13 % (ref 11.5–15.5)
WBC: 6.2 10*3/uL (ref 4.0–10.5)

## 2017-08-24 LAB — TSH: TSH: 1.32 u[IU]/mL (ref 0.35–4.50)

## 2017-08-24 LAB — COMPREHENSIVE METABOLIC PANEL
ALBUMIN: 4.1 g/dL (ref 3.5–5.2)
ALK PHOS: 58 U/L (ref 39–117)
ALT: 20 U/L (ref 0–53)
AST: 17 U/L (ref 0–37)
BILIRUBIN TOTAL: 0.8 mg/dL (ref 0.2–1.2)
BUN: 18 mg/dL (ref 6–23)
CALCIUM: 8.9 mg/dL (ref 8.4–10.5)
CO2: 27 mEq/L (ref 19–32)
Chloride: 104 mEq/L (ref 96–112)
Creatinine, Ser: 0.76 mg/dL (ref 0.40–1.50)
GFR: 107.95 mL/min (ref >60.00)
Glucose, Bld: 98 mg/dL (ref 70–99)
POTASSIUM: 4.3 meq/L (ref 3.5–5.1)
Sodium: 138 mEq/L (ref 135–145)
TOTAL PROTEIN: 6.5 g/dL (ref 6.0–8.3)

## 2017-08-24 LAB — LIPID PANEL
CHOLESTEROL: 207 mg/dL — AB (ref 0–200)
HDL: 61.8 mg/dL (ref >39.00)
LDL Cholesterol: 124 mg/dL — ABNORMAL HIGH (ref 0–99)
NonHDL: 145.14
Total CHOL/HDL Ratio: 3
Triglycerides: 108 mg/dL (ref 0.0–149.0)
VLDL: 21.6 mg/dL (ref 0.0–40.0)

## 2017-08-24 LAB — PSA, MEDICARE: PSA: 3.24 ng/mL (ref 0.10–4.00)

## 2017-08-24 LAB — HEMOGLOBIN A1C: HEMOGLOBIN A1C: 5.6 % (ref 4.6–6.5)

## 2017-08-31 ENCOUNTER — Encounter: Payer: Self-pay | Admitting: Internal Medicine

## 2017-08-31 ENCOUNTER — Ambulatory Visit (INDEPENDENT_AMBULATORY_CARE_PROVIDER_SITE_OTHER): Payer: 59 | Admitting: Family Medicine

## 2017-08-31 ENCOUNTER — Encounter: Payer: Self-pay | Admitting: Family Medicine

## 2017-08-31 VITALS — BP 106/68 | HR 73 | Temp 98.1°F | Ht 70.25 in | Wt 173.5 lb

## 2017-08-31 DIAGNOSIS — Z125 Encounter for screening for malignant neoplasm of prostate: Secondary | ICD-10-CM | POA: Diagnosis not present

## 2017-08-31 DIAGNOSIS — R739 Hyperglycemia, unspecified: Secondary | ICD-10-CM

## 2017-08-31 DIAGNOSIS — I4892 Unspecified atrial flutter: Secondary | ICD-10-CM

## 2017-08-31 DIAGNOSIS — Z86010 Personal history of colonic polyps: Secondary | ICD-10-CM

## 2017-08-31 DIAGNOSIS — I719 Aortic aneurysm of unspecified site, without rupture: Secondary | ICD-10-CM

## 2017-08-31 DIAGNOSIS — Z0000 Encounter for general adult medical examination without abnormal findings: Secondary | ICD-10-CM | POA: Diagnosis not present

## 2017-08-31 DIAGNOSIS — L570 Actinic keratosis: Secondary | ICD-10-CM

## 2017-08-31 DIAGNOSIS — E7800 Pure hypercholesterolemia, unspecified: Secondary | ICD-10-CM

## 2017-08-31 DIAGNOSIS — R7989 Other specified abnormal findings of blood chemistry: Secondary | ICD-10-CM | POA: Insufficient documentation

## 2017-08-31 MED ORDER — PRAVASTATIN SODIUM 80 MG PO TABS: 80.0000 mg | ORAL_TABLET | Freq: Every day | ORAL | 3 refills | Status: DC

## 2017-08-31 NOTE — Assessment & Plan Note (Signed)
Temples and R ear Needs to f/u for tx

## 2017-08-31 NOTE — Patient Instructions (Addendum)
You are due for a colonoscopy- stop and see Marvin Pacheco on the way out to work on that   Your psa is up a bit-see your urologist as planned   Keep up the good work with diet and exercise   Take care of yourself   If you want labs in 6 mo - make appt up front to check cbc and cholesterol

## 2017-08-31 NOTE — Assessment & Plan Note (Signed)
Mild at 446 Recent uri with prednisone may add to it  Will re check 6 mo

## 2017-08-31 NOTE — Progress Notes (Signed)
Subjective:    Patient ID: Marvin Pacheco, male    DOB: [birth year], 71 y.o.   MRN: [id number]  HPI Here for health maintenance exam and to review chronic medical problems    Feeling good as well  Really making effort for self care  Eating better - organic/ non gmo  Less processed food /more produce   Wt Readings from Last 3 Encounters:  08/31/17 173 lb 8 oz (78.7 kg)  08/21/16 193 lb 4 oz (87.7 kg)  10/14/15 191 lb (86.6 kg)  wt is down 20 lb - he is thrilled !  Better eating - fruit/veg and nuts/lean protein  Exercise- regular as well  24.72 kg/m   Colonoscopy 1/14 with 5 y f/u - aware   Tetanus shot 2/12  PNA complete Flu shot 9/18  Prostate health H/o BPH- and sees his urologist in late winter  He has a little bit of urinary urgency and nocturia about 2 times per night  Stream is still strong  Lab Results  Component Value Date   PSA 3.24 08/24/2017   PSA 2.62 07/14/2016   PSA 2.28 07/12/2015    zostavax 3/12  Remote hx of a flutter  No problems now   AA 4 cm-watched yearly Goes for that soon to re check  No symptoms   Hyperglycemia  Lab Results  Component Value Date   HGBA1C 5.6 08/24/2017  stable - with diet and exercise   Hyperlipidemia Lab Results  Component Value Date   CHOL 207 (H) 08/24/2017   CHOL 223 (H) 07/14/2016   CHOL 215 (H) 07/12/2015   Lab Results  Component Value Date   HDL 61.80 08/24/2017   HDL 80.50 07/14/2016   HDL 67.70 07/12/2015   Lab Results  Component Value Date   LDLCALC 124 (H) 08/24/2017   LDLCALC 121 (H) 07/14/2016   LDLCALC 126 (H) 07/12/2015   Lab Results  Component Value Date   TRIG 108.0 08/24/2017   TRIG 108.0 07/14/2016   TRIG 108.0 07/12/2015   Lab Results  Component Value Date   CHOLHDL 3 08/24/2017   CHOLHDL 3 07/14/2016   CHOLHDL 3 07/12/2015   Lab Results  Component Value Date   LDLDIRECT 129.8 05/22/2013   LDLDIRECT 106.2 04/08/2012   LDLDIRECT 142.7 10/19/2009   On pravastatin  and diet  Eats fish  Exercises   BP Readings from Last 3 Encounters:  08/31/17 106/68  08/21/16 130/70  10/14/15 120/80   good blood pressure   Lab Results  Component Value Date   CREATININE 0.76 08/24/2017   BUN 18 08/24/2017   NA 138 08/24/2017   K 4.3 08/24/2017   CL 104 08/24/2017   CO2 27 08/24/2017    Lab Results  Component Value Date   ALT 20 08/24/2017   AST 17 08/24/2017   ALKPHOS 58 08/24/2017   BILITOT 0.8 08/24/2017    Lab Results  Component Value Date   WBC 6.2 08/24/2017   HGB 16.2 08/24/2017   HCT 47.4 08/24/2017   MCV 94.7 08/24/2017   PLT 446.0 (H) 08/24/2017     Patient Active Problem List   Diagnosis Date Noted  . Elevated platelet count 08/31/2017  . Snoring 08/21/2016  . Hyperglycemia 07/08/2015  . Need for hepatitis C screening test 07/08/2015  . Actinic keratoses 07/14/2014  . Aortic aneurysm ([place]) 07/14/2014  . Personal history of colonic adenoma 09/18/2012  . Abnormal prostate exam 09/17/2012  . Colon cancer screening 05/22/2012  . Meniere disease  01/15/2012  . ANA positive 01/15/2012  . Adverse effects of medication 01/15/2012  . Vertigo 12/15/2011  . Atrial flutter ([place]) 12/15/2011  . Routine general medical examination at a health care facility 10/03/2011  . Prostate cancer screening 10/03/2011  . ALLERGIC RHINITIS 04/08/2008  . HYPERCHOLESTEROLEMIA 01/09/2008   Past Medical History:  Diagnosis Date  . Aortic aneurysm (HCC)    4 cm- watched by cardiology per pt  . Atrial flutter ([place])   . Cataract   . Hyperlipidemia   . Meniere disease   . Seasonal allergies    Past Surgical History:  Procedure Laterality Date  . bilateral inguinal hernia  1990  . lipoma abdomen  2000  . revision hernia     2000 and 2006. Revision of inguinal hernia   Social History   Tobacco Use  . Smoking status: Never Smoker  . Smokeless tobacco: Never Used  Substance Use Topics  . Alcohol use: No    Alcohol/week: 0.0 oz    Comment: social   . Drug use: No   Family History  Problem Relation Age of Onset  . Colon cancer Neg Hx   . Stomach cancer Neg Hx   . Abnormal newborn screen Sister    No Known Allergies Current Outpatient Medications on File Prior to Visit  Medication Sig Dispense Refill  . Ascorbic Acid (VITAMIN C) 1000 MG tablet Take 1,000 mg by mouth daily.    Marland Kitchen aspirin 325 MG EC tablet Take 325 mg by mouth daily.    . calcium gluconate 650 MG tablet Take 650 mg by mouth daily.    . cholecalciferol (VITAMIN D) 1000 UNITS tablet Take 4,000 Units by mouth daily.    . fexofenadine (ALLEGRA) 60 MG tablet Take 60 mg by mouth 2 (two) times daily.    . fish oil-omega-3 fatty acids 1000 MG capsule Take 1 g by mouth daily.    . fluticasone (FLONASE) 50 MCG/ACT nasal spray USE 2 SPRAYS IN EACH NOSTRIL EVERY DAY (Patient taking differently: USE 2 SPRAYS IN EACH NOSTRIL EVERY DAY AS NEEDED) 16 g 5  . hydrochlorothiazide (HYDRODIURIL) 50 MG tablet Take 50 mg by mouth daily.    . Multiple Vitamin (MULTIVITAMIN) capsule Take 1 capsule by mouth daily.    . NONFORMULARY OR COMPOUNDED ITEM Take 1 capsule by mouth 3 (three) times daily. BETAHISTINE    . Potassium Chloride Crys CR (KLOR-CON M20 PO) Take 1 tablet by mouth daily.    . verapamil (CALAN) 80 MG tablet Take 1 capsule by mouth every 12 (twelve) hours.     No current facility-administered medications on file prior to visit.     Review of Systems  Constitutional: Negative for activity change, appetite change, fatigue, fever and unexpected weight change.  HENT: Negative for congestion, rhinorrhea, sore throat and trouble swallowing.   Eyes: Negative for pain, redness, itching and visual disturbance.  Respiratory: Negative for cough, chest tightness, shortness of breath and wheezing.   Cardiovascular: Negative for chest pain and palpitations.  Gastrointestinal: Negative for abdominal pain, blood in stool, constipation, diarrhea and nausea.  Endocrine: Negative for cold  intolerance, heat intolerance, polydipsia and polyuria.  Genitourinary: Negative for difficulty urinating, dysuria, frequency and urgency.  Musculoskeletal: Negative for arthralgias, joint swelling and myalgias.  Skin: Negative for pallor and rash.  Neurological: Negative for dizziness, tremors, weakness, numbness and headaches.  Hematological: Negative for adenopathy. Does not bruise/bleed easily.  Psychiatric/Behavioral: Negative for decreased concentration and dysphoric mood. The patient is not nervous/anxious.  Objective:   Physical Exam  Constitutional: He appears well-developed and well-nourished. No distress.  Well appearing   HENT:  Head: Normocephalic and atraumatic.  Right Ear: External ear normal.  Left Ear: External ear normal.  Nose: Nose normal.  Mouth/Throat: Oropharynx is clear and moist.  Eyes: Conjunctivae and EOM are normal. Pupils are equal, round, and reactive to light. Right eye exhibits no discharge. Left eye exhibits no discharge. No scleral icterus.  Neck: Normal range of motion. Neck supple. No JVD present. Carotid bruit is not present. No thyromegaly present.  Cardiovascular: Normal rate, regular rhythm, normal heart sounds and intact distal pulses. Exam reveals no gallop.  Pulmonary/Chest: Effort normal and breath sounds normal. No respiratory distress. He has no wheezes. He exhibits no tenderness.  Abdominal: Soft. Bowel sounds are normal. He exhibits no distension, no abdominal bruit, no pulsatile midline mass and no mass. There is no tenderness.  Musculoskeletal: He exhibits no edema or tenderness.  Lymphadenopathy:    He has no cervical adenopathy.  Neurological: He is alert. He has normal reflexes. No cranial nerve deficit. He exhibits normal muscle tone. Coordination normal.  Skin: Skin is warm and dry. No rash noted. No erythema. No pallor.  Solar lentigines diffusely  aks on temples and ears   Psychiatric: He has a normal mood and affect.           Assessment & Plan:   Problem List Items Addressed This Visit      Cardiovascular and Mediastinum   Aortic aneurysm ([place])    Continues yearly f/u  No symptoms Working to control bp and cholesterol       Relevant Medications   aspirin 325 MG EC tablet   pravastatin (PRAVACHOL) 80 MG tablet   Atrial flutter (HCC)    No re occurrence       Relevant Medications   aspirin 325 MG EC tablet   pravastatin (PRAVACHOL) 80 MG tablet     Musculoskeletal and Integument   Actinic keratoses    Temples and R ear Needs to f/u for tx         Other   Elevated platelet count    Mild at 446 Recent uri with prednisone may add to it  Will re check 6 mo       HYPERCHOLESTEROLEMIA    Disc goals for lipids and reasons to control them Rev labs with pt Rev low sat fat diet in detail Stable with pravastatin and diet  Re check 6 mo      Relevant Medications   aspirin 325 MG EC tablet   pravastatin (PRAVACHOL) 80 MG tablet   Hyperglycemia    Lab Results  Component Value Date   HGBA1C 5.6 08/24/2017   disc imp of low glycemic diet and wt loss to prevent DM2       Personal history of colonic adenoma    Soon due for recall colonoscopy 1/19  Ref made       Relevant Orders   Ambulatory referral to Gastroenterology   Prostate cancer screening    Lab Results  Component Value Date   PSA 3.24 08/24/2017   PSA 2.62 07/14/2016   PSA 2.28 07/12/2015   Hx of bph  For f/u urology as planned       Routine general medical examination at a health care facility - Primary    Reviewed health habits including diet and exercise and skin cancer prevention Reviewed appropriate screening tests for age  Also reviewed health mt  list, fam hx and immunization status , as well as social and family history   See HPI Labs rev  Will watch platelet ct  Ref for recall colonoscopy  Urology f/u as scheduled - psa up slt

## 2017-09-02 NOTE — Assessment & Plan Note (Signed)
Continues yearly f/u  No symptoms Working to control bp and cholesterol

## 2017-09-02 NOTE — Assessment & Plan Note (Signed)
Soon due for recall colonoscopy 1/19  Ref made

## 2017-09-02 NOTE — Assessment & Plan Note (Signed)
Disc goals for lipids and reasons to control them Rev labs with pt Rev low sat fat diet in detail Stable with pravastatin and diet  Re check 6 mo

## 2017-09-02 NOTE — Assessment & Plan Note (Signed)
Lab Results  Component Value Date   PSA 3.24 08/24/2017   PSA 2.62 07/14/2016   PSA 2.28 07/12/2015   Hx of bph  For f/u urology as planned

## 2017-09-02 NOTE — Assessment & Plan Note (Signed)
Lab Results  Component Value Date   HGBA1C 5.6 08/24/2017   disc imp of low glycemic diet and wt loss to prevent DM2

## 2017-09-02 NOTE — Assessment & Plan Note (Signed)
Reviewed health habits including diet and exercise and skin cancer prevention Reviewed appropriate screening tests for age  Also reviewed health mt list, fam hx and immunization status , as well as social and family history   See HPI Labs rev  Will watch platelet ct  Ref for recall colonoscopy  Urology f/u as scheduled - psa up slt

## 2017-09-02 NOTE — Assessment & Plan Note (Signed)
No reoccurrence

## 2017-10-12 ENCOUNTER — Encounter: Payer: 59 | Admitting: Internal Medicine

## 2017-11-07 ENCOUNTER — Telehealth: Payer: Self-pay | Admitting: Family Medicine

## 2017-11-07 NOTE — Telephone Encounter (Signed)
Copied from [place]. Topic: Quick Communication - See Telephone Encounter >> Nov 07, 2017  3:39 PM Ivar Drape wrote: CRM for notification. See Telephone encounter for:  11/07/17. Patient cut himself at work on a cutting machine and he wants to know when he took his last Tetanus injection.  A message can be left with his wife or on the answering machine.  [phone]

## 2017-11-08 NOTE — Telephone Encounter (Signed)
Advised spouse that per immunization record in Epic patient had a tetanus shoto n 11/02/2010. / It is recommended that a booster is given every 10 years.

## 2017-12-27 ENCOUNTER — Encounter: Payer: Self-pay | Admitting: Internal Medicine

## 2018-09-19 ENCOUNTER — Encounter: Payer: Medicare Other | Admitting: Family Medicine

## 2018-09-19 ENCOUNTER — Telehealth: Payer: Self-pay

## 2018-09-19 NOTE — Telephone Encounter (Signed)
Pt said last night pt was sweating all over, got dizzy and fainted, pt s wife said pt lost consciousness for short period but his arms did shake while out. Took BP multiple times and was around 70/50. Pt did not go to ED. Now pt feels OK but BP 97/62. Pt did not have CP or SOB. Pt request appt today. Pt scheduled appt with Dr Einar Pheasant 09/19/18 at 9:20. If condition changes prior to appt pt will go to ED.

## 2018-09-19 NOTE — Progress Notes (Signed)
Pt left w/o being seen

## 2018-10-14 ENCOUNTER — Ambulatory Visit (INDEPENDENT_AMBULATORY_CARE_PROVIDER_SITE_OTHER): Payer: Medicare Other

## 2018-10-14 VITALS — BP 102/80 | HR 65 | Temp 97.8°F | Ht 71.75 in | Wt 179.0 lb

## 2018-10-14 DIAGNOSIS — E7800 Pure hypercholesterolemia, unspecified: Secondary | ICD-10-CM | POA: Diagnosis not present

## 2018-10-14 DIAGNOSIS — Z125 Encounter for screening for malignant neoplasm of prostate: Secondary | ICD-10-CM

## 2018-10-14 DIAGNOSIS — R739 Hyperglycemia, unspecified: Secondary | ICD-10-CM

## 2018-10-14 DIAGNOSIS — Z0000 Encounter for general adult medical examination without abnormal findings: Secondary | ICD-10-CM | POA: Diagnosis not present

## 2018-10-14 LAB — LIPID PANEL
Cholesterol: 166 mg/dL (ref 0–200)
HDL: 56.9 mg/dL (ref >39.00)
LDL Cholesterol: 83 mg/dL (ref 0–99)
NonHDL: 109.49
TRIGLYCERIDES: 132 mg/dL (ref 0.0–149.0)
Total CHOL/HDL Ratio: 3
VLDL: 26.4 mg/dL (ref 0.0–40.0)

## 2018-10-14 LAB — HEMOGLOBIN A1C: Hgb A1c MFr Bld: 5.5 % (ref 4.6–6.5)

## 2018-10-14 LAB — PSA, MEDICARE: PSA: 2.6 ng/ml (ref 0.10–4.00)

## 2018-10-14 NOTE — Patient Instructions (Signed)
Marvin Pacheco , Thank you for taking time to come for your Medicare Wellness Visit. I appreciate your ongoing commitment to your health goals. Please review the following plan we discussed and let me know if I can assist you in the future.   These are the goals we discussed: Goals    . Patient Stated     Starting 10/14/2018, I will continue to exercise for 45 minutes 3-4 days per week.        This is a list of the screening recommended for you and due dates:  Health Maintenance  Topic Date Due  . Colon Cancer Screening  09/03/2020*  . Tetanus Vaccine  11/01/2020  . Flu Shot  Completed  .  Hepatitis C: One time screening is recommended by Center for Disease Control  (CDC) for  adults born from [birth year] through 1963.   Completed  . Pneumonia vaccines  Completed  *Topic was postponed. The date shown is not the original due date.   Preventive Care for Adults  A healthy lifestyle and preventive care can promote health and wellness. Preventive health guidelines for adults include the following key practices.  . A routine yearly physical is a good way to check with your health care provider about your health and preventive screening. It is a chance to share any concerns and updates on your health and to receive a thorough exam.  . Visit your dentist for a routine exam and preventive care every 6 months. Brush your teeth twice a day and floss once a day. Good oral hygiene prevents tooth decay and gum disease.  . The frequency of eye exams is based on your age, health, family medical history, use  of contact lenses, and other factors. Follow your health care provider's recommendations for frequency of eye exams.  . Eat a healthy diet. Foods like vegetables, fruits, whole grains, low-fat dairy products, and lean protein foods contain the nutrients you need without too many calories. Decrease your intake of foods high in solid fats, added sugars, and salt. Eat the right amount of calories for you. Get  information about a proper diet from your health care provider, if necessary.  . Regular physical exercise is one of the most important things you can do for your health. Most adults should get at least 150 minutes of moderate-intensity exercise (any activity that increases your heart rate and causes you to sweat) each week. In addition, most adults need muscle-strengthening exercises on 2 or more days a week.  Silver Sneakers may be a benefit available to you. To determine eligibility, you may visit the website: www.silversneakers.com or contact program at (252) 596-4087 Mon-Fri between 8AM-8PM.   . Maintain a healthy weight. The body mass index (BMI) is a screening tool to identify possible weight problems. It provides an estimate of body fat based on height and weight. Your health care provider can find your BMI and can help you achieve or maintain a healthy weight.   For adults 20 years and older: ? A BMI below 18.5 is considered underweight. ? A BMI of 18.5 to 24.9 is normal. ? A BMI of 25 to 29.9 is considered overweight. ? A BMI of 30 and above is considered obese.   . Maintain normal blood lipids and cholesterol levels by exercising and minimizing your intake of saturated fat. Eat a balanced diet with plenty of fruit and vegetables. Blood tests for lipids and cholesterol should begin at age 33 and be repeated every 5 years. If your  lipid or cholesterol levels are high, you are over 50, or you are at high risk for heart disease, you may need your cholesterol levels checked more frequently. Ongoing high lipid and cholesterol levels should be treated with medicines if diet and exercise are not working.  . If you smoke, find out from your health care provider how to quit. If you do not use tobacco, please do not start.  . If you choose to drink alcohol, please do not consume more than 2 drinks per day. One drink is considered to be 12 ounces (355 mL) of beer, 5 ounces (148 mL) of wine, or 1.5  ounces (44 mL) of liquor.  . If you are 74-75 years old, ask your health care provider if you should take aspirin to prevent strokes.  . Use sunscreen. Apply sunscreen liberally and repeatedly throughout the day. You should seek shade when your shadow is shorter than you. Protect yourself by wearing long sleeves, pants, a wide-brimmed hat, and sunglasses year round, whenever you are outdoors.  . Once a month, do a whole body skin exam, using a mirror to look at the skin on your back. Tell your health care provider of new moles, moles that have irregular borders, moles that are larger than a pencil eraser, or moles that have changed in shape or color.

## 2018-10-14 NOTE — Progress Notes (Signed)
Labs only visit. No charge for AWV. Medicare Part B benefits began 03/2018 per insurance card.

## 2018-10-21 ENCOUNTER — Encounter: Payer: Self-pay | Admitting: Family Medicine

## 2018-10-21 ENCOUNTER — Ambulatory Visit (INDEPENDENT_AMBULATORY_CARE_PROVIDER_SITE_OTHER): Payer: Medicare Other | Admitting: Family Medicine

## 2018-10-21 VITALS — BP 126/62 | HR 66 | Temp 98.1°F | Ht 71.75 in | Wt 178.1 lb

## 2018-10-21 DIAGNOSIS — N400 Enlarged prostate without lower urinary tract symptoms: Secondary | ICD-10-CM | POA: Insufficient documentation

## 2018-10-21 DIAGNOSIS — R350 Frequency of micturition: Secondary | ICD-10-CM

## 2018-10-21 DIAGNOSIS — E7800 Pure hypercholesterolemia, unspecified: Secondary | ICD-10-CM

## 2018-10-21 DIAGNOSIS — L570 Actinic keratosis: Secondary | ICD-10-CM

## 2018-10-21 DIAGNOSIS — R7303 Prediabetes: Secondary | ICD-10-CM

## 2018-10-21 DIAGNOSIS — I4892 Unspecified atrial flutter: Secondary | ICD-10-CM

## 2018-10-21 DIAGNOSIS — N401 Enlarged prostate with lower urinary tract symptoms: Secondary | ICD-10-CM

## 2018-10-21 DIAGNOSIS — Z1211 Encounter for screening for malignant neoplasm of colon: Secondary | ICD-10-CM | POA: Diagnosis not present

## 2018-10-21 DIAGNOSIS — H8109 Meniere's disease, unspecified ear: Secondary | ICD-10-CM

## 2018-10-21 DIAGNOSIS — I719 Aortic aneurysm of unspecified site, without rupture: Secondary | ICD-10-CM

## 2018-10-21 DIAGNOSIS — Z0000 Encounter for general adult medical examination without abnormal findings: Secondary | ICD-10-CM | POA: Diagnosis not present

## 2018-10-21 DIAGNOSIS — Z125 Encounter for screening for malignant neoplasm of prostate: Secondary | ICD-10-CM

## 2018-10-21 MED ORDER — PRAVASTATIN SODIUM 80 MG PO TABS: 80.0000 mg | ORAL_TABLET | Freq: Every day | ORAL | 3 refills | Status: DC

## 2018-10-21 NOTE — Assessment & Plan Note (Signed)
Lab Results  Component Value Date   HGBA1C 5.5 10/14/2018   Good habits disc imp of low glycemic diet and wt loss to prevent DM2

## 2018-10-21 NOTE — Assessment & Plan Note (Signed)
Per pt- afib intermittent now so he is on eliquis  Without rapid rate  Nl rhythm today  For f/u with cardiology soon to make plan

## 2018-10-21 NOTE — Assessment & Plan Note (Signed)
tx on face by derm  Counseled on sun protection

## 2018-10-21 NOTE — Assessment & Plan Note (Signed)
Lab Results  Component Value Date   PSA 2.60 10/14/2018   PSA 3.24 08/24/2017   PSA 2.62 07/14/2016   Due for f/u with urology for BPH (and symptoms have worsened)  Did not do DRE today I lieu of that  Enc him to call if he needs a ref

## 2018-10-21 NOTE — Progress Notes (Signed)
Subjective:    Patient ID: Marvin Pacheco, male    DOB: [birth year], 73 y.o.   MRN: [id number]  HPI Here for welcome to medicare preventative visit   I have personally reviewed the Medicare Annual Wellness questionnaire and have noted 1. The patient's medical and social history 2. Their use of alcohol, tobacco or illicit drugs 3. Their current medications and supplements 4. The patient's functional ability including ADL's, fall risks, home safety risks and hearing or visual             impairment. 5. Diet and physical activities 6. Evidence for depression or mood disorders  The patients weight, height, BMI have been recorded in the chart and visual acuity is per eye clinic.  I have made referrals, counseling and provided education to the patient based review of the above and I have provided the pt with a written personalized care plan for preventive services. Reviewed and updated provider list, see scanned forms.  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  Routine anticipatory guidance given to patient.  See health maintenance. Colon cancer screening had colonoscopy 1/14 with 5 y recall  Will do  Flu vaccine 12/19  Tetanus vaccine 2/12 Pneumovax-completed both Zoster vaccine-zostavax 3/12  Prostate cancer screening  Lab Results  Component Value Date   PSA 2.60 10/14/2018   PSA 3.24 08/24/2017   PSA 2.62 07/14/2016   has a h/o BPH Has not seen urology in the past year Has noticed some more frequency Nocturia 3-4 times  (also drinks water pretty late)  Also some dribbling   Advance directive-does not have  Cognitive function addressed- see scanned forms- and if abnormal then additional documentation follows.  No concerning problems    Wt Readings from Last 3 Encounters:  10/21/18 178 lb 1 oz (80.8 kg)  10/14/18 179 lb (81.2 kg)  08/31/17 173 lb 8 oz (78.7 kg)  eating well and exercising - going to the gym  Has done very well after weight loss  24.32 kg/m   PMH and SH reviewed  Meds, vitals, and allergies reviewed.   ROS: See HPI.  Otherwise negative.    Sees ENT for hearing/ has exams and hearing aide Has yearly eye/vision exam    H/o AA 4 cm -stable and doing well   Prediabetes Lab Results  Component Value Date   HGBA1C 5.5 10/14/2018    Hyperlipidemia Lab Results  Component Value Date   CHOL 166 10/14/2018   CHOL 207 (H) 08/24/2017   CHOL 223 (H) 07/14/2016   Lab Results  Component Value Date   HDL 56.90 10/14/2018   HDL 61.80 08/24/2017   HDL 80.50 07/14/2016   Lab Results  Component Value Date   LDLCALC 83 10/14/2018   LDLCALC 124 (H) 08/24/2017   LDLCALC 121 (H) 07/14/2016   Lab Results  Component Value Date   TRIG 132.0 10/14/2018   TRIG 108.0 08/24/2017  TRIG 108.0 07/14/2016   Lab Results  Component Value Date   CHOLHDL 3 10/14/2018   CHOLHDL 3 08/24/2017   CHOLHDL 3 07/14/2016   Lab Results  Component Value Date   LDLDIRECT 129.8 05/22/2013   LDLDIRECT 106.2 04/08/2012   LDLDIRECT 142.7 10/19/2009   pravastatin and diet  Improved LDL significantly  Diet is improved    Looked over labs from [facility] cbc with platelet ct of 340 Nl chem profile with GFR over 90 Nl tot protein Nl tsh at 1.40  Patient Active Problem List   Diagnosis Date Noted  . Welcome to Medicare preventive visit 10/21/2018  . Elevated platelet count 08/31/2017  . Snoring 08/21/2016  . Prediabetes 07/08/2015  . Need for hepatitis C screening test 07/08/2015  . Actinic  keratoses 07/14/2014  . Aortic aneurysm ([place]) 07/14/2014  . Personal history of colonic adenoma 09/18/2012  . Abnormal prostate exam 09/17/2012  . Colon cancer screening 05/22/2012  . Meniere disease 01/15/2012  . ANA positive 01/15/2012  . Adverse effects of medication 01/15/2012  . Vertigo 12/15/2011  . Atrial flutter ([place]) 12/15/2011  . Routine general medical examination at a health care facility 10/03/2011  . Prostate cancer screening 10/03/2011  . ALLERGIC RHINITIS 04/08/2008  . HYPERCHOLESTEROLEMIA 01/09/2008   Past Medical History:  Diagnosis Date  . Aortic aneurysm ([facility])    4 cm- watched by cardiology per pt  . Atrial flutter ([place])   . Cataract   . Hyperlipidemia   . Meniere disease   . Seasonal allergies    Past Surgical History:  Procedure Laterality Date  . bilateral inguinal hernia  1990  . lipoma abdomen  2000  . revision hernia     2000 and 2006. Revision of inguinal hernia  . TOOTH EXTRACTION     Social History   Tobacco Use  . Smoking status: Never Smoker  . Smokeless tobacco: Never Used  Substance Use Topics  . Alcohol use: No    Alcohol/week: 0.0 standard drinks    Comment: social  . Drug use: No   Family History  Problem Relation Age of Onset  . Abnormal newborn screen Sister   . Colon cancer Neg Hx   . Stomach cancer Neg Hx    No Known Allergies Current Outpatient Medications on File Prior to Visit  Medication Sig Dispense Refill  . apixaban (ELIQUIS) 5 MG TABS tablet Take by mouth.    . Ascorbic Acid (VITAMIN C) 1000 MG tablet Take 1,000 mg by mouth daily.    . calcium gluconate 650 MG tablet Take 650 mg by mouth daily.    . cholecalciferol (VITAMIN D) 1000 UNITS tablet Take 4,000 Units by mouth daily.    Marland Kitchen co-enzyme Q-10 50 MG capsule Take 50 mg by mouth daily.    . fexofenadine (ALLEGRA) 60 MG tablet Take 60 mg by mouth as needed.     . fish oil-omega-3 fatty acids 1000 MG capsule Take 1 g by mouth daily.    . fluticasone (FLONASE)  50 MCG/ACT nasal spray USE 2 SPRAYS IN EACH NOSTRIL EVERY DAY (Patient taking differently: USE 2 SPRAYS IN EACH NOSTRIL EVERY DAY AS NEEDED) 16 g 5  . hydrochlorothiazide (HYDRODIURIL) 50 MG tablet Take 50 mg by mouth daily.    . Multiple Vitamin (MULTIVITAMIN) capsule Take 1 capsule by mouth daily.    . NONFORMULARY OR COMPOUNDED ITEM Take 1 capsule by mouth 3 (three) times daily. BETAHISTINE    . Potassium Chloride Crys CR (KLOR-CON  M20 PO) Take 1 tablet by mouth daily.    . verapamil (CALAN) 80 MG tablet Take 1 capsule by mouth every 12 (twelve) hours.     No current facility-administered medications on file prior to visit.     Review of Systems  Constitutional: Negative for activity change, appetite change, fatigue, fever and unexpected weight change.  HENT: Positive for hearing loss. Negative for congestion, rhinorrhea, sore throat and trouble swallowing.   Eyes: Negative for pain, redness, itching and visual disturbance.  Respiratory: Negative for cough, chest tightness, shortness of breath and wheezing.   Cardiovascular: Negative for chest pain and palpitations.       A fib- he does not know when he is in it   Gastrointestinal: Negative for abdominal pain, blood in stool, constipation, diarrhea and nausea.  Endocrine: Negative for cold intolerance, heat intolerance, polydipsia and polyuria.  Genitourinary: Positive for frequency and urgency. Negative for difficulty urinating, dysuria and hematuria.  Musculoskeletal: Negative for arthralgias, joint swelling and myalgias.  Skin: Negative for pallor and rash.  Neurological: Positive for syncope. Negative for dizziness, tremors, weakness, numbness and headaches.       One episode of syncope  Hematological: Negative for adenopathy. Does not bruise/bleed easily.  Psychiatric/Behavioral: Negative for decreased concentration and dysphoric mood. The patient is not nervous/anxious.        Objective:   Physical Exam Constitutional:       General: He is not in acute distress.    Appearance: Normal appearance. He is well-developed and normal weight. He is not ill-appearing.  HENT:     Head: Normocephalic and atraumatic.     Right Ear: Tympanic membrane, ear canal and external ear normal.     Left Ear: Tympanic membrane, ear canal and external ear normal.     Ears:     Comments: L hearing aide    Nose: Nose normal.     Mouth/Throat:     Mouth: Mucous membranes are moist.     Pharynx: Oropharynx is clear.  Eyes:     General: No scleral icterus.       Right eye: No discharge.        Left eye: No discharge.     Conjunctiva/sclera: Conjunctivae normal.     Pupils: Pupils are equal, round, and reactive to light.  Neck:     Musculoskeletal: Normal range of motion and neck supple.     Thyroid: No thyromegaly.     Vascular: No carotid bruit or JVD.  Cardiovascular:     Rate and Rhythm: Normal rate and regular rhythm.     Pulses: Normal pulses.     Heart sounds: Normal heart sounds. No gallop.   Pulmonary:     Effort: Pulmonary effort is normal. No respiratory distress.     Breath sounds: Normal breath sounds. No wheezing.  Chest:     Chest wall: No tenderness.  Abdominal:     General: Bowel sounds are normal. There is no distension or abdominal bruit.     Palpations: Abdomen is soft. There is no mass.     Tenderness: There is no abdominal tenderness.  Musculoskeletal:        General: No tenderness.     Right lower leg: No edema.     Left lower leg: No edema.  Lymphadenopathy:     Cervical: No cervical adenopathy.  Skin:    General: Skin is warm and dry.     Capillary Refill: Capillary refill takes less than 2 seconds.  Coloration: Skin is not pale.     Findings: No erythema or rash.     Comments: Solar lentigines diffusely Some solar aging  Neurological:     General: No focal deficit present.     Mental Status: He is alert.     Cranial Nerves: No cranial nerve deficit.     Motor: No abnormal muscle tone.       Coordination: Coordination normal.     Deep Tendon Reflexes: Reflexes are normal and symmetric.  Psychiatric:        Mood and Affect: Mood normal.           Assessment & Plan:   Problem List Items Addressed This Visit      Cardiovascular and Mediastinum   Atrial flutter ([place])    Per pt- afib intermittent now so he is on eliquis  Without rapid rate  Nl rhythm today  For f/u with cardiology soon to make plan      Relevant Medications   pravastatin (PRAVACHOL) 80 MG tablet   Aortic aneurysm (HCC)    Stable w/o growth or symptoms  Continue to follow      Relevant Medications   pravastatin (PRAVACHOL) 80 MG tablet     Nervous and Auditory   Meniere disease    No recent flares        Musculoskeletal and Integument   Actinic keratoses    tx on face by derm  Counseled on sun protection         Genitourinary   BPH (benign prostatic hyperplasia)    Overdue for urology fu-he will schedule  psa stable  More frequency and nocturia         Other   HYPERCHOLESTEROLEMIA    Disc goals for lipids and reasons to control them Rev last labs with pt Rev low sat fat diet in detail Improved LDL Continues pravastatin       Relevant Medications   pravastatin (PRAVACHOL) 80 MG tablet   Prostate cancer screening    Lab Results  Component Value Date   PSA 2.60 10/14/2018   PSA 3.24 08/24/2017   PSA 2.62 07/14/2016   Due for f/u with urology for BPH (and symptoms have worsened)  Did not do DRE today I lieu of that  Enc him to call if he needs a ref       Colon cancer screening    Now on eliquis with a fib  Due for colonoscopy but needs to hold off until above is stabilized  Ordered cologuard testing       Prediabetes    Lab Results  Component Value Date   HGBA1C 5.5 10/14/2018   Good habits disc imp of low glycemic diet and wt loss to prevent DM2       Welcome to Medicare preventive visit - Primary    Reviewed health habits including diet and  exercise and skin cancer prevention Reviewed appropriate screening tests for age  Also reviewed health mt list, fam hx and immunization status , as well as social and family history    Labs rev See HPI 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

## 2018-10-21 NOTE — Assessment & Plan Note (Signed)
Now on eliquis with a fib  Due for colonoscopy but needs to hold off until above is stabilized  Ordered cologuard testing

## 2018-10-21 NOTE — Patient Instructions (Addendum)
Get a follow up with urology this year  Let us know if you need a urology referral   Please work on your living will and power of attorney -see the blue booklet   We will sign you up at cologuard program for colon screening   Take care of yourself  Keep exercising  Follow up with cardiology as planned

## 2018-10-21 NOTE — Assessment & Plan Note (Signed)
Overdue for urology fu-he will schedule  psa stable  More frequency and nocturia

## 2018-10-21 NOTE — Assessment & Plan Note (Signed)
Disc goals for lipids and reasons to control them Rev last labs with pt Rev low sat fat diet in detail Improved LDL Continues pravastatin

## 2018-10-21 NOTE — Assessment & Plan Note (Signed)
Stable w/o growth or symptoms  Continue to follow

## 2018-10-21 NOTE — Assessment & Plan Note (Signed)
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

## 2018-10-21 NOTE — Assessment & Plan Note (Signed)
No recent flares 

## 2018-10-25 ENCOUNTER — Other Ambulatory Visit: Payer: Self-pay | Admitting: Family Medicine

## 2018-11-01 LAB — COLOGUARD

## 2018-11-14 ENCOUNTER — Encounter: Payer: Self-pay | Admitting: *Deleted

## 2019-06-05 HISTORY — PX: ABLATION: SHX5711

## 2019-06-10 ENCOUNTER — Ambulatory Visit (INDEPENDENT_AMBULATORY_CARE_PROVIDER_SITE_OTHER): Payer: Medicare Other

## 2019-06-10 DIAGNOSIS — Z23 Encounter for immunization: Secondary | ICD-10-CM | POA: Diagnosis not present

## 2019-10-28 ENCOUNTER — Ambulatory Visit: Payer: Medicare Other

## 2019-10-30 ENCOUNTER — Other Ambulatory Visit: Payer: Self-pay | Admitting: Family Medicine

## 2019-10-31 ENCOUNTER — Encounter: Payer: Medicare Other | Admitting: Family Medicine

## 2019-12-05 ENCOUNTER — Telehealth: Payer: Self-pay | Admitting: Family Medicine

## 2019-12-05 DIAGNOSIS — Z0000 Encounter for general adult medical examination without abnormal findings: Secondary | ICD-10-CM

## 2019-12-05 DIAGNOSIS — E7800 Pure hypercholesterolemia, unspecified: Secondary | ICD-10-CM

## 2019-12-05 DIAGNOSIS — R7303 Prediabetes: Secondary | ICD-10-CM

## 2019-12-05 DIAGNOSIS — Z125 Encounter for screening for malignant neoplasm of prostate: Secondary | ICD-10-CM

## 2019-12-05 DIAGNOSIS — N401 Enlarged prostate with lower urinary tract symptoms: Secondary | ICD-10-CM

## 2019-12-05 NOTE — Telephone Encounter (Signed)
-----   Message from Ellamae Sia sent at 12/04/2019  3:20 PM EDT ----- Regarding: Lab orders for Wednesday, 4.14.21  AWV lab orders, please.

## 2019-12-15 ENCOUNTER — Other Ambulatory Visit: Payer: Self-pay

## 2019-12-15 ENCOUNTER — Ambulatory Visit (INDEPENDENT_AMBULATORY_CARE_PROVIDER_SITE_OTHER): Payer: Medicare PPO

## 2019-12-15 VITALS — BP 116/86 | HR 129 | Wt 180.0 lb

## 2019-12-15 DIAGNOSIS — Z0000 Encounter for general adult medical examination without abnormal findings: Secondary | ICD-10-CM | POA: Diagnosis not present

## 2019-12-15 NOTE — Patient Instructions (Signed)
Marvin Pacheco , Thank you for taking time to come for your Medicare Wellness Visit. I appreciate your ongoing commitment to your health goals. Please review the following plan we discussed and let me know if I can assist you in the future.   Screening recommendations/referrals: Colonoscopy: due Recommended yearly ophthalmology/optometry visit for glaucoma screening and checkup Recommended yearly dental visit for hygiene and checkup  Vaccinations: Influenza vaccine: Up to date, completed 06/10/2019 Pneumococcal vaccine: Completed series Tdap vaccine: Up to date, completed 11/02/2010 Shingles vaccine: discussed    Advanced directives: Advance directive discussed with you today. Even though you declined this today please call our office should you change your mind and we can give you the proper paperwork for you to fill out.  Conditions/risks identified: hypercholesterolemia  Next appointment: 12/22/2019 @ 8:30 am  Preventive Care 72 Years and Older, Male Preventive care refers to lifestyle choices and visits with your health care provider that can promote health and wellness. What does preventive care include?  A yearly physical exam. This is also called an annual well check.  Dental exams once or twice a year.  Routine eye exams. Ask your health care provider how often you should have your eyes checked.  Personal lifestyle choices, including:  Daily care of your teeth and gums.  Regular physical activity.  Eating a healthy diet.  Avoiding tobacco and drug use.  Limiting alcohol use.  Practicing safe sex.  Taking low doses of aspirin every day.  Taking vitamin and mineral supplements as recommended by your health care provider. What happens during an annual well check? The services and screenings done by your health care provider during your annual well check will depend on your age, overall health, lifestyle risk factors, and family history of disease. Counseling  Your  health care provider may ask you questions about your:  Alcohol use.  Tobacco use.  Drug use.  Emotional well-being.  Home and relationship well-being.  Sexual activity.  Eating habits.  History of falls.  Memory and ability to understand (cognition).  Work and work Statistician. Screening  You may have the following tests or measurements:  Height, weight, and BMI.  Blood pressure.  Lipid and cholesterol levels. These may be checked every 5 years, or more frequently if you are over 60 years old.  Skin check.  Lung cancer screening. You may have this screening every year starting at age 67 if you have a 30-pack-year history of smoking and currently smoke or have quit within the past 15 years.  Fecal occult blood test (FOBT) of the stool. You may have this test every year starting at age 20.  Flexible sigmoidoscopy or colonoscopy. You may have a sigmoidoscopy every 5 years or a colonoscopy every 10 years starting at age 5.  Prostate cancer screening. Recommendations will vary depending on your family history and other risks.  Hepatitis C blood test.  Hepatitis B blood test.  Sexually transmitted disease (STD) testing.  Diabetes screening. This is done by checking your blood sugar (glucose) after you have not eaten for a while (fasting). You may have this done every 1-3 years.  Abdominal aortic aneurysm (AAA) screening. You may need this if you are a current or former smoker.  Osteoporosis. You may be screened starting at age 13 if you are at high risk. Talk with your health care provider about your test results, treatment options, and if necessary, the need for more tests. Vaccines  Your health care provider may recommend certain vaccines, such  as:  Influenza vaccine. This is recommended every year.  Tetanus, diphtheria, and acellular pertussis (Tdap, Td) vaccine. You may need a Td booster every 10 years.  Zoster vaccine. You may need this after age  22.  Pneumococcal 13-valent conjugate (PCV13) vaccine. One dose is recommended after age 10.  Pneumococcal polysaccharide (PPSV23) vaccine. One dose is recommended after age 84. Talk to your health care provider about which screenings and vaccines you need and how often you need them. This information is not intended to replace advice given to you by your health care provider. Make sure you discuss any questions you have with your health care provider. Document Released: 09/17/2015 Document Revised: 05/10/2016 Document Reviewed: 06/22/2015 Elsevier Interactive Patient Education  2017 [employer] Prevention in the Home Falls can cause injuries. They can happen to people of all ages. There are many things you can do to make your home safe and to help prevent falls. What can I do on the outside of my home?  Regularly fix the edges of walkways and driveways and fix any cracks.  Remove anything that might make you trip as you walk through a door, such as a raised step or threshold.  Trim any bushes or trees on the path to your home.  Use bright outdoor lighting.  Clear any walking paths of anything that might make someone trip, such as rocks or tools.  Regularly check to see if handrails are loose or broken. Make sure that both sides of any steps have handrails.  Any raised decks and porches should have guardrails on the edges.  Have any leaves, snow, or ice cleared regularly.  Use sand or salt on walking paths during winter.  Clean up any spills in your garage right away. This includes oil or grease spills. What can I do in the bathroom?  Use night lights.  Install grab bars by the toilet and in the tub and shower. Do not use towel bars as grab bars.  Use non-skid mats or decals in the tub or shower.  If you need to sit down in the shower, use a plastic, non-slip stool.  Keep the floor dry. Clean up any water that spills on the floor as soon as it happens.  Remove  soap buildup in the tub or shower regularly.  Attach bath mats securely with double-sided non-slip rug tape.  Do not have throw rugs and other things on the floor that can make you trip. What can I do in the bedroom?  Use night lights.  Make sure that you have a light by your bed that is easy to reach.  Do not use any sheets or blankets that are too big for your bed. They should not hang down onto the floor.  Have a firm chair that has side arms. You can use this for support while you get dressed.  Do not have throw rugs and other things on the floor that can make you trip. What can I do in the kitchen?  Clean up any spills right away.  Avoid walking on wet floors.  Keep items that you use a lot in easy-to-reach places.  If you need to reach something above you, use a strong step stool that has a grab bar.  Keep electrical cords out of the way.  Do not use floor polish or wax that makes floors slippery. If you must use wax, use non-skid floor wax.  Do not have throw rugs and other things on the  floor that can make you trip. What can I do with my stairs?  Do not leave any items on the stairs.  Make sure that there are handrails on both sides of the stairs and use them. Fix handrails that are broken or loose. Make sure that handrails are as long as the stairways.  Check any carpeting to make sure that it is firmly attached to the stairs. Fix any carpet that is loose or worn.  Avoid having throw rugs at the top or bottom of the stairs. If you do have throw rugs, attach them to the floor with carpet tape.  Make sure that you have a light switch at the top of the stairs and the bottom of the stairs. If you do not have them, ask someone to add them for you. What else can I do to help prevent falls?  Wear shoes that:  Do not have high heels.  Have rubber bottoms.  Are comfortable and fit you well.  Are closed at the toe. Do not wear sandals.  If you use a  stepladder:  Make sure that it is fully opened. Do not climb a closed stepladder.  Make sure that both sides of the stepladder are locked into place.  Ask someone to hold it for you, if possible.  Clearly mark and make sure that you can see:  Any grab bars or handrails.  First and last steps.  Where the edge of each step is.  Use tools that help you move around (mobility aids) if they are needed. These include:  Canes.  Walkers.  Scooters.  Crutches.  Turn on the lights when you go into a dark area. Replace any light bulbs as soon as they burn out.  Set up your furniture so you have a clear path. Avoid moving your furniture around.  If any of your floors are uneven, fix them.  If there are any pets around you, be aware of where they are.  Review your medicines with your doctor. Some medicines can make you feel dizzy. This can increase your chance of falling. Ask your doctor what other things that you can do to help prevent falls. This information is not intended to replace advice given to you by your health care provider. Make sure you discuss any questions you have with your health care provider. Document Released: 06/17/2009 Document Revised: 01/27/2016 Document Reviewed: 09/25/2014 Elsevier Interactive Patient Education  2017 [employer].

## 2019-12-15 NOTE — Progress Notes (Signed)
PCP notes:  Health Maintenance: Colonoscopy- due, Patient wants referral placed   Abnormal Screenings: none   Patient concerns: none   Nurse concerns: none   Next PCP appt.: 12/22/2019 @ 8:30 am

## 2019-12-15 NOTE — Progress Notes (Signed)
Subjective:   Marvin Pacheco is a 74 y.o. male who presents for Medicare Annual/Subsequent preventive examination.  Review of Systems: N/A   This visit is being conducted through telemedicine via telephone at the nurse health advisor's home address due to the COVID-19 pandemic. This patient has given me verbal consent via doximity to conduct this visit, patient states they are participating from their home address. Patient and myself are on the telephone call. There is no referral for this visit. Some vital signs may be absent or patient reported.    Patient identification: identified by name, DOB, and current address   Cardiac Risk Factors include: advanced age (>19men, >52 women);male gender;Other (see comment), Risk factor comments: hypercholesterolemia     Objective:    Vitals: BP 116/86   Pulse (!) 129   Wt 180 lb (81.6 kg)   BMI 24.58 kg/m   Body mass index is 24.58 kg/m.  Advanced Directives 12/15/2019 10/14/2018  Does Patient Have a Medical Advance Directive? No No  Would patient like information on creating a medical advance directive? No - Patient declined Yes (MAU/Ambulatory/Procedural Areas - Information given)    Tobacco Social History   Tobacco Use  Smoking Status Never Smoker  Smokeless Tobacco Never Used     Counseling given: Not Answered   Clinical Intake:  Pre-visit preparation completed: Yes  Pain : No/denies pain     Nutritional Risks: None Diabetes: No  How often do you need to have someone help you when you read instructions, pamphlets, or other written materials from your doctor or pharmacy?: 1 - Never What is the last grade level you completed in school?: phD  Interpreter Needed?: No  Information entered by :: CJohnson, LPN  Past Medical History:  Diagnosis Date  . Aortic aneurysm (HCC)    4 cm- watched by cardiology per pt  . Atrial flutter ([place])   . Cataract   . Hyperlipidemia   . Meniere disease   . Seasonal allergies     Past Surgical History:  Procedure Laterality Date  . ABLATION  06/2019   heart  . bilateral inguinal hernia  1990  . lipoma abdomen  2000  . revision hernia     2000 and 2006. Revision of inguinal hernia  . TOOTH EXTRACTION     Family History  Problem Relation Age of Onset  . Abnormal newborn screen Sister   . Colon cancer Neg Hx   . Stomach cancer Neg Hx    Social History   Socioeconomic History  . Marital status: Married    Spouse name: Not on file  . Number of children: Not on file  . Years of education: Not on file  . Highest education level: Not on file  Occupational History  . Not on file  Tobacco Use  . Smoking status: Never Smoker  . Smokeless tobacco: Never Used  Substance and Sexual Activity  . Alcohol use: No    Alcohol/week: 0.0 standard drinks  . Drug use: No  . Sexual activity: Not on file  Other Topics Concern  . Not on file  Social History Narrative  . Not on file   Social Determinants of Health   Financial Resource Strain: Low Risk   . Difficulty of Paying Living Expenses: Not hard at all  Food Insecurity: No Food Insecurity  . Worried About Charity fundraiser in the Last Year: Never true  . Ran Out of Food in the Last Year: Never true  Transportation Needs:  No Transportation Needs  . Lack of Transportation (Medical): No  . Lack of Transportation (Non-Medical): No  Physical Activity: Sufficiently Active  . Days of Exercise per Week: 7 days  . Minutes of Exercise per Session: 50 min  Stress: No Stress Concern Present  . Feeling of Stress : Not at all  Social Connections:   . Frequency of Communication with Friends and Family:   . Frequency of Social Gatherings with Friends and Family:   . Attends Religious Services:   . Active Member of Clubs or Organizations:   . Attends Archivist Meetings:   Marland Kitchen Marital Status:     Outpatient Encounter Medications as of 12/15/2019  Medication Sig  . [id number] Bestahistine (Serc) - 16 mg PO  TID  . apixaban (ELIQUIS) 5 MG TABS tablet Take by mouth.  . Ascorbic Acid (VITAMIN C) 1000 MG tablet Take 1,000 mg by mouth daily.  . calcium gluconate 650 MG tablet Take 650 mg by mouth daily.  . cholecalciferol (VITAMIN D) 1000 UNITS tablet Take 4,000 Units by mouth daily.  Marland Kitchen co-enzyme Q-10 50 MG capsule Take 50 mg by mouth daily.  . fexofenadine (ALLEGRA) 60 MG tablet Take 60 mg by mouth as needed.   . fish oil-omega-3 fatty acids 1000 MG capsule Take 1 g by mouth daily.  . fluticasone (FLONASE) 50 MCG/ACT nasal spray USE 2 SPRAYS IN EACH NOSTRIL EVERY DAY (Patient taking differently: USE 2 SPRAYS IN EACH NOSTRIL EVERY DAY AS NEEDED)  . hydrochlorothiazide (HYDRODIURIL) 50 MG tablet Take 50 mg by mouth daily.  . Multiple Vitamin (MULTIVITAMIN) capsule Take 1 capsule by mouth daily.  . NONFORMULARY OR COMPOUNDED ITEM Take 1 capsule by mouth 3 (three) times daily. BETAHISTINE  . Potassium Chloride Crys CR (KLOR-CON M20 PO) Take 1 tablet by mouth daily.  . pravastatin (PRAVACHOL) 80 MG tablet TAKE 1 TABLET BY MOUTH EVERY DAY  . verapamil (CALAN) 80 MG tablet Take 1 capsule by mouth every 12 (twelve) hours.   No facility-administered encounter medications on file as of 12/15/2019.    Activities of Daily Living In your present state of health, do you have any difficulty performing the following activities: 12/15/2019  Hearing? Y  Comment wears hearing aid in left ear  Vision? N  Difficulty concentrating or making decisions? N  Walking or climbing stairs? N  Dressing or bathing? N  Doing errands, shopping? N  Preparing Food and eating ? N  Using the Toilet? N  In the past six months, have you accidently leaked urine? Y  Comment "dribble" sometimes  Do you have problems with loss of bowel control? N  Managing your Medications? N  Managing your Finances? N  Housekeeping or managing your Housekeeping? N  Some recent data might be hidden    Patient Care Team: Tower, Wynelle Fanny, MD as PCP  - General   Assessment:   This is a routine wellness examination for Eri.  Exercise Activities and Dietary recommendations Current Exercise Habits: Home exercise routine, Type of exercise: walking, Time (Minutes): 45, Frequency (Times/Week): 7, Weekly Exercise (Minutes/Week): 315, Intensity: Moderate, Exercise limited by: None identified  Goals    . Patient Stated     Starting 10/14/2018, I will continue to exercise for 45 minutes 3-4 days per week.     . Patient Stated     12/15/2019, I will continue to walk daily for 30-45 minutes       Fall Risk Fall Risk  12/15/2019 10/14/2018 08/31/2017 07/14/2014 05/27/2013  Falls in the past year? 0 0 No Yes No  Number falls in past yr: 0 - - 1 -  Injury with Fall? 0 - - No -  Risk for fall due to : Medication side effect - - - -  Follow up Falls evaluation completed;Falls prevention discussed - - - -   Is the patient's home free of loose throw rugs in walkways, pet beds, electrical cords, etc?   yes      Grab bars in the bathroom? no      Handrails on the stairs?   yes      Adequate lighting?   yes  Timed Get Up and Go Performed: N/A  Depression Screen PHQ 2/9 Scores 12/15/2019 10/14/2018 08/31/2017 07/14/2014  PHQ - 2 Score 0 0 0 0  PHQ- 9 Score 0 0 - -    Cognitive Function MMSE - Mini Mental State Exam 12/15/2019 10/14/2018  Orientation to time 5 5  Orientation to Place 5 5  Registration 3 3  Attention/ Calculation 5 0  Recall 3 3  Language- name 2 objects - 0  Language- repeat 1 1  Language- follow 3 step command - 3  Language- read & follow direction - 0  Write a sentence - 0  Copy design - 0  Total score - 20  Mini Cog  Mini-Cog screen was completed. Maximum score is 22. A value of 0 denotes this part of the MMSE was not completed or the patient failed this part of the Mini-Cog screening.       Immunization History  Administered Date(s) Administered  . Fluad Quad(high Dose 65+) 06/10/2019  . Influenza Split  07/20/2011, 05/22/2012  . Influenza Whole 06/05/2007, 09/04/2009, 08/24/2010  . Influenza, High Dose Seasonal PF 06/03/2017, 08/29/2018  . Influenza,inj,Quad PF,6+ Mos 05/27/2013, 06/02/2016  . Influenza-Unspecified 06/08/2014, 06/09/2015  . Moderna SARS-COVID-2 Vaccination 10/02/2019, 10/30/2019  . Pneumococcal Conjugate-13 06/08/2014  . Pneumococcal Polysaccharide-23 05/27/2013  . Td 09/04/2000  . Tdap 11/02/2010  . Zoster 12/02/2010    Qualifies for Shingles Vaccine: yes  Screening Tests Health Maintenance  Topic Date Due  . COLONOSCOPY  09/12/2017  . INFLUENZA VACCINE  04/04/2020  . TETANUS/TDAP  11/01/2020  . Hepatitis C Screening  Completed  . PNA vac Low Risk Adult  Completed   Cancer Screenings: Lung: Low Dose CT Chest recommended if Age 36-80 years, 30 pack-year currently smoking OR have quit w/in 15 years. Patient does not qualify. Colorectal: due, Patient agrees to have this completed  Additional Screenings:  Hepatitis C Screening: 07/12/2015      Plan:   Patient will continue to walk everyday for 30-45 minutes.   I have personally reviewed and noted the following in the patient's chart:   . Medical and social history . Use of alcohol, tobacco or illicit drugs  . Current medications and supplements . Functional ability and status . Nutritional status . Physical activity . Advanced directives . List of other physicians . Hospitalizations, surgeries, and ER visits in previous 12 months . Vitals . Screenings to include cognitive, depression, and falls . Referrals and appointments  In addition, I have reviewed and discussed with patient certain preventive protocols, quality metrics, and best practice recommendations. A written personalized care plan for preventive services as well as general preventive health recommendations were provided to patient.     Andrez Grime, LPN  [id number]

## 2019-12-17 ENCOUNTER — Other Ambulatory Visit: Payer: Self-pay

## 2019-12-17 ENCOUNTER — Other Ambulatory Visit (INDEPENDENT_AMBULATORY_CARE_PROVIDER_SITE_OTHER): Payer: Medicare PPO

## 2019-12-17 DIAGNOSIS — Z125 Encounter for screening for malignant neoplasm of prostate: Secondary | ICD-10-CM

## 2019-12-17 DIAGNOSIS — N401 Enlarged prostate with lower urinary tract symptoms: Secondary | ICD-10-CM

## 2019-12-17 DIAGNOSIS — Z0000 Encounter for general adult medical examination without abnormal findings: Secondary | ICD-10-CM | POA: Diagnosis not present

## 2019-12-17 DIAGNOSIS — E7800 Pure hypercholesterolemia, unspecified: Secondary | ICD-10-CM

## 2019-12-17 DIAGNOSIS — R7303 Prediabetes: Secondary | ICD-10-CM | POA: Diagnosis not present

## 2019-12-17 DIAGNOSIS — R350 Frequency of micturition: Secondary | ICD-10-CM | POA: Diagnosis not present

## 2019-12-17 LAB — CBC WITH DIFFERENTIAL/PLATELET
Basophils Absolute: 0.1 10*3/uL (ref 0.0–0.1)
Basophils Relative: 1 % (ref 0.0–3.0)
Eosinophils Absolute: 0.1 10*3/uL (ref 0.0–0.7)
Eosinophils Relative: 2.1 % (ref 0.0–5.0)
HCT: 47.5 % (ref 39.0–52.0)
Hemoglobin: 16.3 g/dL (ref 13.0–17.0)
Lymphocytes Relative: 26.1 % (ref 12.0–46.0)
Lymphs Abs: 1.8 10*3/uL (ref 0.7–4.0)
MCHC: 34.2 g/dL (ref 30.0–36.0)
MCV: 93.5 fl (ref 78.0–100.0)
Monocytes Absolute: 0.6 10*3/uL (ref 0.1–1.0)
Monocytes Relative: 9.4 % (ref 3.0–12.0)
Neutro Abs: 4.2 10*3/uL (ref 1.4–7.7)
Neutrophils Relative %: 61.4 % (ref 43.0–77.0)
Platelets: 367 10*3/uL (ref 150.0–400.0)
RBC: 5.08 Mil/uL (ref 4.22–5.81)
RDW: 13.3 % (ref 11.5–15.5)
WBC: 6.9 10*3/uL (ref 4.0–10.5)

## 2019-12-17 LAB — COMPREHENSIVE METABOLIC PANEL
ALT: 29 U/L (ref 0–53)
AST: 24 U/L (ref 0–37)
Albumin: 4.6 g/dL (ref 3.5–5.2)
Alkaline Phosphatase: 51 U/L (ref 39–117)
BUN: 18 mg/dL (ref 6–23)
CO2: 28 mEq/L (ref 19–32)
Calcium: 9.1 mg/dL (ref 8.4–10.5)
Chloride: 102 mEq/L (ref 96–112)
Creatinine, Ser: 0.8 mg/dL (ref 0.40–1.50)
GFR: 95.1 mL/min (ref >60.00)
Glucose, Bld: 103 mg/dL — ABNORMAL HIGH (ref 70–99)
Potassium: 3.9 mEq/L (ref 3.5–5.1)
Sodium: 138 mEq/L (ref 135–145)
Total Bilirubin: 0.8 mg/dL (ref 0.2–1.2)
Total Protein: 6.7 g/dL (ref 6.0–8.3)

## 2019-12-17 LAB — PSA, MEDICARE: PSA: 2.52 ng/ml (ref 0.10–4.00)

## 2019-12-17 LAB — LIPID PANEL
Cholesterol: 182 mg/dL (ref 0–200)
HDL: 64.9 mg/dL (ref >39.00)
LDL Cholesterol: 98 mg/dL (ref 0–99)
NonHDL: 117.46
Total CHOL/HDL Ratio: 3
Triglycerides: 95 mg/dL (ref 0.0–149.0)
VLDL: 19 mg/dL (ref 0.0–40.0)

## 2019-12-17 LAB — HEMOGLOBIN A1C: Hgb A1c MFr Bld: 5.4 % (ref 4.6–6.5)

## 2019-12-17 LAB — TSH: TSH: 1.69 u[IU]/mL (ref 0.35–4.50)

## 2019-12-22 ENCOUNTER — Other Ambulatory Visit: Payer: Self-pay

## 2019-12-22 ENCOUNTER — Encounter: Payer: Self-pay | Admitting: Family Medicine

## 2019-12-22 ENCOUNTER — Ambulatory Visit (INDEPENDENT_AMBULATORY_CARE_PROVIDER_SITE_OTHER): Payer: Medicare PPO | Admitting: Family Medicine

## 2019-12-22 ENCOUNTER — Telehealth: Payer: Self-pay | Admitting: Family Medicine

## 2019-12-22 VITALS — BP 124/72 | HR 118 | Temp 97.9°F | Ht 70.25 in | Wt 175.6 lb

## 2019-12-22 DIAGNOSIS — I4892 Unspecified atrial flutter: Secondary | ICD-10-CM | POA: Diagnosis not present

## 2019-12-22 DIAGNOSIS — Z860100 Personal history of colon polyps, unspecified: Secondary | ICD-10-CM

## 2019-12-22 DIAGNOSIS — R7303 Prediabetes: Secondary | ICD-10-CM

## 2019-12-22 DIAGNOSIS — R350 Frequency of micturition: Secondary | ICD-10-CM

## 2019-12-22 DIAGNOSIS — R000 Tachycardia, unspecified: Secondary | ICD-10-CM

## 2019-12-22 DIAGNOSIS — E7800 Pure hypercholesterolemia, unspecified: Secondary | ICD-10-CM

## 2019-12-22 DIAGNOSIS — Z0000 Encounter for general adult medical examination without abnormal findings: Secondary | ICD-10-CM | POA: Diagnosis not present

## 2019-12-22 DIAGNOSIS — H8109 Meniere's disease, unspecified ear: Secondary | ICD-10-CM | POA: Diagnosis not present

## 2019-12-22 DIAGNOSIS — Z1211 Encounter for screening for malignant neoplasm of colon: Secondary | ICD-10-CM

## 2019-12-22 DIAGNOSIS — N401 Enlarged prostate with lower urinary tract symptoms: Secondary | ICD-10-CM

## 2019-12-22 DIAGNOSIS — I719 Aortic aneurysm of unspecified site, without rupture: Secondary | ICD-10-CM

## 2019-12-22 MED ORDER — PRAVASTATIN SODIUM 80 MG PO TABS: 80.0000 mg | ORAL_TABLET | Freq: Every day | ORAL | 3 refills | Status: DC

## 2019-12-22 NOTE — Assessment & Plan Note (Signed)
S/p ablation this fall -has done well  Today pulse rate is up at 118 but rate is regular  He continues eliquis  Aware of elevated HR  Plans to call cardiology when he gets home to arrange f/u and adj his medication if needed

## 2019-12-22 NOTE — Telephone Encounter (Signed)
Pt called cardiology and they are doing an EKG on him tomorrow given his pulse. Pt will ask them then about the safety of getting a colonoscopy verse cologuard. Pt will update [place] once he gets an answer from cardiology and will go from there.

## 2019-12-22 NOTE — Assessment & Plan Note (Signed)
Reviewed health habits including diet and exercise and skin cancer prevention Reviewed appropriate screening tests for age  Also reviewed health mt list, fam hx and immunization status , as well as social and family history   See HPI Labs reviewed  Overdue for f/u with urology-he plans to schedule  psa is stable  Also f/u with cardiology for elevated HR -then if clear needs to schedule 7 y colonoscopy (will call for referral)  Has had covid vaccine series

## 2019-12-22 NOTE — Assessment & Plan Note (Signed)
Watched routinely  No clinical changes

## 2019-12-22 NOTE — Progress Notes (Signed)
Subjective:    Patient ID: Marvin Pacheco, male    DOB: 1945-10-19, 74 y.o.   MRN: [id number]  This visit occurred during the SARS-CoV-2 public health emergency.  Safety protocols were in place, including screening questions prior to the visit, additional usage of staff PPE, and extensive cleaning of exam room while observing appropriate contact time as indicated for disinfecting solutions.    HPI Here for health maintenance exam and to review chronic medical problems   Wt Readings from Last 3 Encounters:  12/22/19 175 lb 9 oz (79.6 kg)  12/15/19 180 lb (81.6 kg)  10/21/18 178 lb 1 oz (80.8 kg)  wt is down 5 lb  25.01 kg/m   Wants to start exercising more  Likes to walk  Does work in yard -is strenuous    Armed forces technical officer on line   He had amw on 4/12 Noted colonoscopy is due   Last colonoscopy 1/14 with 5 y recall -adenoma  No stool changes - very regular    He has had his covid vaccine series  Has been quarantining - this is a first visit out    Prostate health  Lab Results  Component Value Date   PSA 2.52 12/17/2019   PSA 2.60 10/14/2018   PSA 3.24 08/24/2017  has h/o BPH Has seen urology -he has not been for visit  Nocturia- if he drinks water late, perhaps 2 times  Not a lot of changes -but has dribble occasionally  No medicines at this time    BP Readings from Last 3 Encounters:  12/22/19 124/72  12/15/19 116/86  10/21/18 126/62   Pulse Readings from Last 3 Encounters:  12/22/19 (!) 118  12/15/19 (!) 129  10/21/18 66   Pulse is high today (he has noticed it) -but does not feel like he is in a fib  H/o intermittent a fib -had an ablation done (this fall in [place]) -has not been back= less fatigue since then  there but had a tele health visit  He takes verapamil 80 mg bid   Has AAA - has check up coming soon    Meniere dz -takes hctz with K - fairly stable / no recent episodes   Hyperlipidemia  Lab Results  Component Value Date   CHOL 182 12/17/2019   CHOL 166 10/14/2018   CHOL 207 (H) 08/24/2017   Lab Results  Component Value Date   HDL 64.90 12/17/2019   HDL 56.90 10/14/2018   HDL 61.80 08/24/2017   Lab Results  Component Value Date   LDLCALC 98 12/17/2019   LDLCALC 83 10/14/2018   LDLCALC 124 (H) 08/24/2017   Lab Results  Component Value Date   TRIG 95.0 12/17/2019   TRIG 132.0 10/14/2018   TRIG 108.0 08/24/2017   Lab Results  Component Value Date   CHOLHDL 3 12/17/2019   CHOLHDL 3 10/14/2018   CHOLHDL 3 08/24/2017   Lab Results  Component Value Date   LDLDIRECT 129.8 05/22/2013   LDLDIRECT 106.2 04/08/2012   LDLDIRECT 142.7 10/19/2009   Takes pravastatin  HDL went up - good  LDL is 98    Prediabetes Lab Results  Component Value Date   HGBA1C 5.4 12/17/2019  glucose 103  He eats fruit /veggies  occ crackers/triscuits      Lab Results  Component Value Date   WBC 6.9 12/17/2019   HGB 16.3 12/17/2019   HCT 47.5 12/17/2019   MCV 93.5 12/17/2019   PLT 367.0 12/17/2019  Lab Results  Component Value Date   CREATININE 0.80 12/17/2019   BUN 18 12/17/2019   NA 138 12/17/2019   K 3.9 12/17/2019   CL 102 12/17/2019   CO2 28 12/17/2019   Lab Results  Component Value Date   ALT 29 12/17/2019   AST 24 12/17/2019   ALKPHOS 51 12/17/2019   BILITOT 0.8 12/17/2019   Lab Results  Component Value Date   TSH 1.69 12/17/2019    Patient Active Problem List   Diagnosis Date Noted  . Tachycardia 12/22/2019  . Welcome to Medicare preventive visit 10/21/2018  . BPH (benign prostatic hyperplasia) 10/21/2018  . Snoring 08/21/2016  . Prediabetes 07/08/2015  . Actinic keratoses 07/14/2014  . Aortic aneurysm ([place]) 07/14/2014  . Personal history of colonic adenoma 09/18/2012  . Abnormal prostate exam 09/17/2012  . Colon cancer screening 05/22/2012  . Meniere disease 01/15/2012  . ANA positive 01/15/2012  . Adverse effects of medication 01/15/2012  . Vertigo 12/15/2011  . Atrial  flutter ([place]) 12/15/2011  . Routine general medical examination at a health care facility 10/03/2011  . Prostate cancer screening 10/03/2011  . ALLERGIC RHINITIS 04/08/2008  . HYPERCHOLESTEROLEMIA 01/09/2008   Past Medical History:  Diagnosis Date  . Aortic aneurysm ([facility])    4 cm- watched by cardiology per pt  . Atrial flutter ([place])   . Cataract   . Hyperlipidemia   . Meniere disease   . Seasonal allergies    Past Surgical History:  Procedure Laterality Date  . ABLATION  06/2019   heart  . bilateral inguinal hernia  1990  . lipoma abdomen  2000  . revision hernia     2000 and 2006. Revision of inguinal hernia  . TOOTH EXTRACTION     Social History   Tobacco Use  . Smoking status: Never Smoker  . Smokeless tobacco: Never Used  Substance Use Topics  . Alcohol use: No    Alcohol/week: 0.0 standard drinks  . Drug use: No   Family History  Problem Relation Age of Onset  . Abnormal newborn screen Sister   . Colon cancer Neg Hx   . Stomach cancer Neg Hx    No Known Allergies Current Outpatient Medications on File Prior to Visit  Medication Sig Dispense Refill  . [id number] Bestahistine ([place]erc) - 16 mg PO TID    . apixaban (ELIQUIS) 5 MG TABS tablet Take by mouth.    . Ascorbic Acid (VITAMIN C) 1000 MG tablet Take 1,000 mg by mouth daily.    . calcium gluconate 650 MG tablet Take 650 mg by mouth daily.    . cholecalciferol (VITAMIN D) 1000 UNITS tablet Take 4,000 Units by mouth daily.    Marland Kitchen co-enzyme Q-10 50 MG capsule Take 50 mg by mouth daily.    . fish oil-omega-3 fatty acids 1000 MG capsule Take 1 g by mouth daily.    . hydrochlorothiazide (HYDRODIURIL) 50 MG tablet Take 50 mg by mouth daily.    . Multiple Vitamin (MULTIVITAMIN) capsule Take 1 capsule by mouth daily.    . NONFORMULARY OR COMPOUNDED ITEM Take 1 capsule by mouth 3 (three) times daily. BETAHISTINE    . Potassium Chloride Crys CR (KLOR-CON M20 PO) Take 1 tablet by mouth daily.    . verapamil (CALAN) 80 MG  tablet Take 1 capsule by mouth every 12 (twelve) hours.     No current facility-administered medications on file prior to visit.    Review of Systems  Constitutional: Negative for activity  change, appetite change, fatigue, fever and unexpected weight change.  HENT: Negative for congestion, rhinorrhea, sore throat and trouble swallowing.   Eyes: Negative for pain, redness, itching and visual disturbance.  Respiratory: Negative for cough, chest tightness, shortness of breath and wheezing.   Cardiovascular: Negative for chest pain and palpitations.       Has noticed fasting heart rate lately  Gastrointestinal: Negative for abdominal pain, anal bleeding, blood in stool, constipation, diarrhea and nausea.  Endocrine: Negative for cold intolerance, heat intolerance, polydipsia and polyuria.  Genitourinary: Negative for decreased urine volume, difficulty urinating, dysuria, frequency and urgency.       Nocturia  Musculoskeletal: Negative for arthralgias, joint swelling and myalgias.  Skin: Negative for pallor and rash.  Neurological: Negative for dizziness, tremors, weakness, numbness and headaches.  Hematological: Negative for adenopathy. Does not bruise/bleed easily.  Psychiatric/Behavioral: Negative for decreased concentration and dysphoric mood. The patient is not nervous/anxious.        Objective:   Physical Exam Constitutional:      General: He is not in acute distress.    Appearance: Normal appearance. He is well-developed and normal weight. He is not ill-appearing or diaphoretic.  HENT:     Head: Normocephalic and atraumatic.     Right Ear: Tympanic membrane, ear canal and external ear normal.     Left Ear: Tympanic membrane, ear canal and external ear normal.     Nose: Nose normal. No congestion.     Mouth/Throat:     Mouth: Mucous membranes are moist.     Pharynx: Oropharynx is clear. No posterior oropharyngeal erythema.  Eyes:     General: No scleral icterus.       Right  eye: No discharge.        Left eye: No discharge.     Conjunctiva/sclera: Conjunctivae normal.     Pupils: Pupils are equal, round, and reactive to light.  Neck:     Thyroid: No thyromegaly.     Vascular: No carotid bruit or JVD.  Cardiovascular:     Rate and Rhythm: Regular rhythm. Tachycardia present.     Pulses: Normal pulses.     Heart sounds: Normal heart sounds. No gallop.   Pulmonary:     Effort: Pulmonary effort is normal. No respiratory distress.     Breath sounds: Normal breath sounds. No wheezing or rales.     Comments: Good air exch Chest:     Chest wall: No tenderness.  Abdominal:     General: Bowel sounds are normal. There is no distension or abdominal bruit.     Palpations: Abdomen is soft. There is no mass.     Tenderness: There is no abdominal tenderness.     Hernia: No hernia is present.  Musculoskeletal:        General: No tenderness.     Cervical back: Normal range of motion and neck supple. No rigidity. No muscular tenderness.     Right lower leg: No edema.     Left lower leg: No edema.     Comments: No kyphosis   Lymphadenopathy:     Cervical: No cervical adenopathy.  Skin:    General: Skin is warm and dry.     Coloration: Skin is not pale.     Findings: No erythema or rash.     Comments: Solar lentigines diffusely Solar aging  Abraded keratosis on L temple with scab (recently bled)    Neurological:     Mental Status: He is alert.  Cranial Nerves: No cranial nerve deficit.     Motor: No abnormal muscle tone.     Coordination: Coordination normal.     Gait: Gait normal.     Deep Tendon Reflexes: Reflexes are normal and symmetric. Reflexes normal.  Psychiatric:        Mood and Affect: Mood normal.        Cognition and Memory: Cognition normal.     Comments: Pleasant            Assessment & Plan:   Problem List Items Addressed This Visit      Cardiovascular and Mediastinum   Atrial flutter ([place])    S/p ablation this fall -has done  well  Today pulse rate is up at 118 but rate is regular  He continues eliquis  Aware of elevated HR  Plans to call cardiology when he gets home to arrange f/u and adj his medication if needed       Relevant Medications   pravastatin (PRAVACHOL) 80 MG tablet   Aortic aneurysm (HCC)    Watched routinely  No clinical changes        Relevant Medications   pravastatin (PRAVACHOL) 80 MG tablet     Nervous and Auditory   Meniere disease    Stable with hctz         Genitourinary   BPH (benign prostatic hyperplasia)    Lab Results  Component Value Date   PSA 2.52 12/17/2019   PSA 2.60 10/14/2018   PSA 3.24 08/24/2017    Not a lot of clinical change Has had prostate bx with urology in past (neg) and has h/o abn prostate exam  Is overdue for yearly f/u -he plans to make that appt and let us know if ref is needed         Other   HYPERCHOLESTEROLEMIA    Disc goals for lipids and reasons to control them Rev last labs with pt Rev low sat fat diet in detail  On  Pravastatin both LDL and HDL went up - no change in ratio      Relevant Medications   pravastatin (PRAVACHOL) 80 MG tablet   Routine general medical examination at a health care facility - Primary    Reviewed health habits including diet and exercise and skin cancer prevention Reviewed appropriate screening tests for age  Also reviewed health mt list, fam hx and immunization status , as well as social and family history   See HPI Labs reviewed  Overdue for f/u with urology-he plans to schedule  psa is stable  Also f/u with cardiology for elevated HR -then if clear needs to schedule 5 y colonoscopy (will call for referral)  Has had covid vaccine series        Colon cancer screening    Due for his 5 y recall for polyps  inst pt to let us know when clear cardiac wise for that-his pulse is up today and also takes eliquis  cologuard may not be the best choice since already has h/o polyps        Personal  history of colonic adenoma    Due for 5 y recall colonoscopy once cleared by cardiology      Prediabetes    Lab Results  Component Value Date   HGBA1C 5.4 12/17/2019   Stable disc imp of low glycemic diet and wt loss to prevent DM2       Tachycardia    New today- with regular rhythm (h/o  a flutter in past wiith ablation in the fall) Takes verapamil  Recommend checking in with cardiology-may need adjustment Pt is aware but not particularly symptomatic

## 2019-12-22 NOTE — Telephone Encounter (Signed)
I would not do a colonoscopy until he gets the heart rate figured out and follows up with cardiology (he should tell them he plans on a colonoscopy)  If they advise it is not safe-would do cologuard  It is preferable to do a colonoscopy so they can remove any polyps if needed   (also cologuard it nor recommended first line if following polyps)   Let me know how it goes- after he talks to/sees cardiology I will either do the referral or order cologuard depending on what they say

## 2019-12-22 NOTE — Assessment & Plan Note (Signed)
Stable with hctz

## 2019-12-22 NOTE — Assessment & Plan Note (Signed)
Lab Results  Component Value Date   PSA 2.52 12/17/2019   PSA 2.60 10/14/2018   PSA 3.24 08/24/2017    Not a lot of clinical change Has had prostate bx with urology in past (neg) and has h/o abn prostate exam  Is overdue for yearly f/u -he plans to make that appt and let us know if ref is needed

## 2019-12-22 NOTE — Assessment & Plan Note (Signed)
Due for his 45 y recall for polyps  inst pt to let us know when clear cardiac wise for that-his pulse is up today and also takes eliquis  cologuard may not be the best choice since already has h/o polyps

## 2019-12-22 NOTE — Patient Instructions (Addendum)
After cardiology follow up - then we can plan your colonoscopy  Just call us to do the referral when ready   Schedule your urology visit when you are ready (yearly follow up)   To prevent diabetes Try to get most of your carbohydrates from produce (with the exception of white potatoes)  Eat less bread/pasta/rice/snack foods/cereals/sweets and other items from the middle of the grocery store (processed carbs)   Call your cardiologist and let them know your pulse is high (when you get home  118 today, 129 last time  You may need an adjustment in verapamil

## 2019-12-22 NOTE — Assessment & Plan Note (Signed)
Due for 5 y recall colonoscopy once cleared by cardiology

## 2019-12-22 NOTE — Assessment & Plan Note (Signed)
Disc goals for lipids and reasons to control them Rev last labs with pt Rev low sat fat diet in detail  On  Pravastatin both LDL and HDL went up - no change in ratio

## 2019-12-22 NOTE — Assessment & Plan Note (Signed)
Lab Results  Component Value Date   HGBA1C 5.4 12/17/2019   Stable disc imp of low glycemic diet and wt loss to prevent DM2

## 2019-12-22 NOTE — Assessment & Plan Note (Signed)
New today- with regular rhythm (h/o a flutter in past wiith ablation in the fall) Takes verapamil  Recommend checking in with cardiology-may need adjustment Pt is aware but not particularly symptomatic

## 2019-12-22 NOTE — Telephone Encounter (Signed)
Pt requesting an order for Cologuard. He is concerned of his HR levels to do a regular Colonoscopy.   Please advise, thanks.

## 2020-02-11 DIAGNOSIS — I4892 Unspecified atrial flutter: Secondary | ICD-10-CM | POA: Diagnosis not present

## 2020-02-11 DIAGNOSIS — I480 Paroxysmal atrial fibrillation: Secondary | ICD-10-CM | POA: Diagnosis not present

## 2020-02-11 DIAGNOSIS — I4430 Unspecified atrioventricular block: Secondary | ICD-10-CM | POA: Diagnosis not present

## 2020-02-11 DIAGNOSIS — R9431 Abnormal electrocardiogram [ECG] [EKG]: Secondary | ICD-10-CM | POA: Diagnosis not present

## 2020-02-18 DIAGNOSIS — I4819 Other persistent atrial fibrillation: Secondary | ICD-10-CM | POA: Diagnosis not present

## 2020-02-18 DIAGNOSIS — I480 Paroxysmal atrial fibrillation: Secondary | ICD-10-CM | POA: Diagnosis not present

## 2020-02-18 DIAGNOSIS — G4733 Obstructive sleep apnea (adult) (pediatric): Secondary | ICD-10-CM | POA: Diagnosis not present

## 2020-02-18 DIAGNOSIS — R002 Palpitations: Secondary | ICD-10-CM | POA: Diagnosis not present

## 2020-02-18 DIAGNOSIS — I483 Typical atrial flutter: Secondary | ICD-10-CM | POA: Diagnosis not present

## 2020-02-18 DIAGNOSIS — I471 Supraventricular tachycardia: Secondary | ICD-10-CM | POA: Diagnosis not present

## 2020-02-18 DIAGNOSIS — E785 Hyperlipidemia, unspecified: Secondary | ICD-10-CM | POA: Diagnosis not present

## 2020-02-18 DIAGNOSIS — Z87891 Personal history of nicotine dependence: Secondary | ICD-10-CM | POA: Diagnosis not present

## 2020-02-18 DIAGNOSIS — Z7901 Long term (current) use of anticoagulants: Secondary | ICD-10-CM | POA: Diagnosis not present

## 2020-02-18 DIAGNOSIS — I77810 Thoracic aortic ectasia: Secondary | ICD-10-CM | POA: Diagnosis not present

## 2020-02-19 DIAGNOSIS — I4819 Other persistent atrial fibrillation: Secondary | ICD-10-CM | POA: Diagnosis not present

## 2020-02-19 DIAGNOSIS — I480 Paroxysmal atrial fibrillation: Secondary | ICD-10-CM | POA: Diagnosis not present

## 2020-02-19 DIAGNOSIS — I483 Typical atrial flutter: Secondary | ICD-10-CM | POA: Diagnosis not present

## 2020-03-19 DIAGNOSIS — H903 Sensorineural hearing loss, bilateral: Secondary | ICD-10-CM | POA: Diagnosis not present

## 2020-04-21 DIAGNOSIS — Z7901 Long term (current) use of anticoagulants: Secondary | ICD-10-CM | POA: Diagnosis not present

## 2020-04-21 DIAGNOSIS — Z8679 Personal history of other diseases of the circulatory system: Secondary | ICD-10-CM | POA: Diagnosis not present

## 2020-04-21 DIAGNOSIS — I4819 Other persistent atrial fibrillation: Secondary | ICD-10-CM | POA: Diagnosis not present

## 2020-04-21 DIAGNOSIS — E785 Hyperlipidemia, unspecified: Secondary | ICD-10-CM | POA: Diagnosis not present

## 2020-04-21 DIAGNOSIS — I483 Typical atrial flutter: Secondary | ICD-10-CM | POA: Diagnosis not present

## 2020-04-22 DIAGNOSIS — R0683 Snoring: Secondary | ICD-10-CM | POA: Diagnosis not present

## 2020-04-22 DIAGNOSIS — G4710 Hypersomnia, unspecified: Secondary | ICD-10-CM | POA: Diagnosis not present

## 2020-05-28 ENCOUNTER — Ambulatory Visit: Payer: Medicare PPO

## 2020-06-02 ENCOUNTER — Ambulatory Visit: Payer: Medicare PPO

## 2020-06-02 ENCOUNTER — Ambulatory Visit (INDEPENDENT_AMBULATORY_CARE_PROVIDER_SITE_OTHER): Payer: Medicare PPO

## 2020-06-02 ENCOUNTER — Other Ambulatory Visit: Payer: Self-pay

## 2020-06-02 DIAGNOSIS — Z23 Encounter for immunization: Secondary | ICD-10-CM

## 2020-06-09 DIAGNOSIS — G4733 Obstructive sleep apnea (adult) (pediatric): Secondary | ICD-10-CM | POA: Diagnosis not present

## 2020-06-09 DIAGNOSIS — G4710 Hypersomnia, unspecified: Secondary | ICD-10-CM | POA: Diagnosis not present

## 2020-06-09 DIAGNOSIS — R683 Clubbing of fingers: Secondary | ICD-10-CM | POA: Diagnosis not present

## 2020-06-09 DIAGNOSIS — I4891 Unspecified atrial fibrillation: Secondary | ICD-10-CM | POA: Diagnosis not present

## 2020-06-22 DIAGNOSIS — H2513 Age-related nuclear cataract, bilateral: Secondary | ICD-10-CM | POA: Diagnosis not present

## 2020-06-28 DIAGNOSIS — R0683 Snoring: Secondary | ICD-10-CM | POA: Diagnosis not present

## 2020-06-28 DIAGNOSIS — G4710 Hypersomnia, unspecified: Secondary | ICD-10-CM | POA: Diagnosis not present

## 2020-08-03 ENCOUNTER — Telehealth: Payer: Self-pay | Admitting: Family Medicine

## 2020-08-03 NOTE — Telephone Encounter (Signed)
Yes, he has already had both pneumonia vaccines

## 2020-08-03 NOTE — Telephone Encounter (Signed)
Patient wants to know if he can have the pneumonia vaccine. Phone # [phone]

## 2020-08-03 NOTE — Telephone Encounter (Signed)
Looks like he is up-to-date on vaccines but will route to PCP for review

## 2020-08-04 NOTE — Telephone Encounter (Signed)
Neoma Laming notified of Dr. Marliss Coots comments

## 2020-12-24 ENCOUNTER — Telehealth: Payer: Self-pay | Admitting: Family Medicine

## 2020-12-24 NOTE — Telephone Encounter (Signed)
Please schedule MWV with Calandra and CPE with fasting labs prior with Dr. Glori Bickers.

## 2020-12-24 NOTE — Telephone Encounter (Signed)
Patient scheduled lab and awv on 02/09/21 and cpx on 02/17/21.

## 2020-12-31 DIAGNOSIS — H2513 Age-related nuclear cataract, bilateral: Secondary | ICD-10-CM | POA: Diagnosis not present

## 2020-12-31 DIAGNOSIS — H353131 Nonexudative age-related macular degeneration, bilateral, early dry stage: Secondary | ICD-10-CM | POA: Diagnosis not present

## 2020-12-31 DIAGNOSIS — H35362 Drusen (degenerative) of macula, left eye: Secondary | ICD-10-CM | POA: Diagnosis not present

## 2021-01-11 ENCOUNTER — Ambulatory Visit: Payer: Medicare PPO | Admitting: Family Medicine

## 2021-01-12 ENCOUNTER — Ambulatory Visit: Payer: Medicare PPO | Admitting: Family Medicine

## 2021-01-18 DIAGNOSIS — R6889 Other general symptoms and signs: Secondary | ICD-10-CM | POA: Diagnosis not present

## 2021-01-18 DIAGNOSIS — N401 Enlarged prostate with lower urinary tract symptoms: Secondary | ICD-10-CM | POA: Diagnosis not present

## 2021-01-18 DIAGNOSIS — R3989 Other symptoms and signs involving the genitourinary system: Secondary | ICD-10-CM | POA: Diagnosis not present

## 2021-01-18 DIAGNOSIS — N138 Other obstructive and reflux uropathy: Secondary | ICD-10-CM | POA: Diagnosis not present

## 2021-01-21 DIAGNOSIS — I480 Paroxysmal atrial fibrillation: Secondary | ICD-10-CM | POA: Diagnosis not present

## 2021-01-21 DIAGNOSIS — I77810 Thoracic aortic ectasia: Secondary | ICD-10-CM | POA: Diagnosis not present

## 2021-01-21 DIAGNOSIS — E785 Hyperlipidemia, unspecified: Secondary | ICD-10-CM | POA: Diagnosis not present

## 2021-01-21 DIAGNOSIS — I351 Nonrheumatic aortic (valve) insufficiency: Secondary | ICD-10-CM | POA: Diagnosis not present

## 2021-02-08 ENCOUNTER — Other Ambulatory Visit: Payer: Self-pay

## 2021-02-08 ENCOUNTER — Other Ambulatory Visit (INDEPENDENT_AMBULATORY_CARE_PROVIDER_SITE_OTHER): Payer: Medicare PPO

## 2021-02-08 ENCOUNTER — Telehealth: Payer: Self-pay | Admitting: Family Medicine

## 2021-02-08 DIAGNOSIS — E7800 Pure hypercholesterolemia, unspecified: Secondary | ICD-10-CM

## 2021-02-08 DIAGNOSIS — N401 Enlarged prostate with lower urinary tract symptoms: Secondary | ICD-10-CM

## 2021-02-08 DIAGNOSIS — R7303 Prediabetes: Secondary | ICD-10-CM

## 2021-02-08 DIAGNOSIS — Z0000 Encounter for general adult medical examination without abnormal findings: Secondary | ICD-10-CM

## 2021-02-08 DIAGNOSIS — R350 Frequency of micturition: Secondary | ICD-10-CM | POA: Diagnosis not present

## 2021-02-08 LAB — LIPID PANEL
Cholesterol: 172 mg/dL (ref 0–200)
HDL: 76.7 mg/dL (ref >39.00)
LDL Cholesterol: 81 mg/dL (ref 0–99)
NonHDL: 95.38
Total CHOL/HDL Ratio: 2
Triglycerides: 74 mg/dL (ref 0.0–149.0)
VLDL: 14.8 mg/dL (ref 0.0–40.0)

## 2021-02-08 LAB — CBC WITH DIFFERENTIAL/PLATELET
Basophils Absolute: 0.1 10*3/uL (ref 0.0–0.1)
Basophils Relative: 1.1 % (ref 0.0–3.0)
Eosinophils Absolute: 0.3 10*3/uL (ref 0.0–0.7)
Eosinophils Relative: 4.7 % (ref 0.0–5.0)
HCT: 44.9 % (ref 39.0–52.0)
Hemoglobin: 15.4 g/dL (ref 13.0–17.0)
Lymphocytes Relative: 23.6 % (ref 12.0–46.0)
Lymphs Abs: 1.4 10*3/uL (ref 0.7–4.0)
MCHC: 34.4 g/dL (ref 30.0–36.0)
MCV: 91.1 fl (ref 78.0–100.0)
Monocytes Absolute: 0.7 10*3/uL (ref 0.1–1.0)
Monocytes Relative: 11.3 % (ref 3.0–12.0)
Neutro Abs: 3.4 10*3/uL (ref 1.4–7.7)
Neutrophils Relative %: 59.3 % (ref 43.0–77.0)
Platelets: 335 10*3/uL (ref 150.0–400.0)
RBC: 4.93 Mil/uL (ref 4.22–5.81)
RDW: 13 % (ref 11.5–15.5)
WBC: 5.8 10*3/uL (ref 4.0–10.5)

## 2021-02-08 LAB — COMPREHENSIVE METABOLIC PANEL
ALT: 25 U/L (ref 0–53)
AST: 23 U/L (ref 0–37)
Albumin: 4.3 g/dL (ref 3.5–5.2)
Alkaline Phosphatase: 50 U/L (ref 39–117)
BUN: 12 mg/dL (ref 6–23)
CO2: 27 mEq/L (ref 19–32)
Calcium: 8.8 mg/dL (ref 8.4–10.5)
Chloride: 102 mEq/L (ref 96–112)
Creatinine, Ser: 0.66 mg/dL (ref 0.40–1.50)
GFR: 93.46 mL/min (ref >60.00)
Glucose, Bld: 98 mg/dL (ref 70–99)
Potassium: 3.8 mEq/L (ref 3.5–5.1)
Sodium: 138 mEq/L (ref 135–145)
Total Bilirubin: 0.7 mg/dL (ref 0.2–1.2)
Total Protein: 6.3 g/dL (ref 6.0–8.3)

## 2021-02-08 LAB — PSA, MEDICARE: PSA: 2.63 ng/ml (ref 0.10–4.00)

## 2021-02-08 LAB — HEMOGLOBIN A1C: Hgb A1c MFr Bld: 5.6 % (ref 4.6–6.5)

## 2021-02-08 LAB — TSH: TSH: 1.08 u[IU]/mL (ref 0.35–4.50)

## 2021-02-08 NOTE — Telephone Encounter (Signed)
-----   Message from Ellamae Sia sent at 02/08/2021  8:15 AM EDT ----- Regarding: lab orders for now Patient is scheduled for CPX labs, please order future labs, Thanks , Karna Christmas

## 2021-02-09 ENCOUNTER — Other Ambulatory Visit: Payer: Medicare PPO

## 2021-02-09 ENCOUNTER — Ambulatory Visit: Payer: Medicare PPO

## 2021-02-11 ENCOUNTER — Ambulatory Visit: Payer: Medicare PPO

## 2021-02-14 ENCOUNTER — Other Ambulatory Visit: Payer: Self-pay | Admitting: Family Medicine

## 2021-02-17 ENCOUNTER — Encounter: Payer: Self-pay | Admitting: Family Medicine

## 2021-02-17 ENCOUNTER — Other Ambulatory Visit: Payer: Self-pay

## 2021-02-17 ENCOUNTER — Ambulatory Visit (INDEPENDENT_AMBULATORY_CARE_PROVIDER_SITE_OTHER): Payer: Medicare PPO | Admitting: Family Medicine

## 2021-02-17 VITALS — BP 110/72 | HR 75 | Temp 98.3°F | Ht 70.0 in | Wt 175.0 lb

## 2021-02-17 DIAGNOSIS — E7800 Pure hypercholesterolemia, unspecified: Secondary | ICD-10-CM

## 2021-02-17 DIAGNOSIS — H8109 Meniere's disease, unspecified ear: Secondary | ICD-10-CM | POA: Diagnosis not present

## 2021-02-17 DIAGNOSIS — I719 Aortic aneurysm of unspecified site, without rupture: Secondary | ICD-10-CM

## 2021-02-17 DIAGNOSIS — Z1211 Encounter for screening for malignant neoplasm of colon: Secondary | ICD-10-CM | POA: Diagnosis not present

## 2021-02-17 DIAGNOSIS — I10 Essential (primary) hypertension: Secondary | ICD-10-CM | POA: Insufficient documentation

## 2021-02-17 DIAGNOSIS — R7303 Prediabetes: Secondary | ICD-10-CM

## 2021-02-17 DIAGNOSIS — I4892 Unspecified atrial flutter: Secondary | ICD-10-CM | POA: Diagnosis not present

## 2021-02-17 DIAGNOSIS — N401 Enlarged prostate with lower urinary tract symptoms: Secondary | ICD-10-CM | POA: Diagnosis not present

## 2021-02-17 DIAGNOSIS — Z0000 Encounter for general adult medical examination without abnormal findings: Secondary | ICD-10-CM | POA: Diagnosis not present

## 2021-02-17 DIAGNOSIS — Z86010 Personal history of colonic polyps: Secondary | ICD-10-CM

## 2021-02-17 DIAGNOSIS — R350 Frequency of micturition: Secondary | ICD-10-CM

## 2021-02-17 DIAGNOSIS — L570 Actinic keratosis: Secondary | ICD-10-CM

## 2021-02-17 MED ORDER — PRAVASTATIN SODIUM 80 MG PO TABS: 80.0000 mg | ORAL_TABLET | Freq: Every day | ORAL | 3 refills | Status: DC

## 2021-02-17 NOTE — Progress Notes (Signed)
Subjective:    Patient ID: Marvin Pacheco, male    DOB: 06-25-1946, 75 y.o.   MRN: [id number]  This visit occurred during the SARS-CoV-2 public health emergency.  Safety protocols were in place, including screening questions prior to the visit, additional usage of staff PPE, and extensive cleaning of exam room while observing appropriate contact time as indicated for disinfecting solutions.   HPI Pt presents for amw and health mt visit with rev of chronic medical problems   I have personally reviewed the Medicare Annual Wellness questionnaire and have noted 1. The patient's medical and social history 2. Their use of alcohol, tobacco or illicit drugs 3. Their current medications and supplements 4. The patient's functional ability including ADL's, fall risks, home safety risks and hearing or visual             impairment. 5. Diet and physical activities 6. Evidence for depression or mood disorders  The patients weight, height, BMI have been recorded in the chart and visual acuity is per eye clinic.  I have made referrals, counseling and provided education to the patient based review of the above and I have provided the pt with a written personalized care plan for preventive services. Reviewed and updated provider list, see scanned forms.  See scanned forms.  Routine anticipatory guidance given to patient.  See health maintenance. Colon cancer screening-due for 5 y colonoscopy (was 2-14) Wants to schedule after his cataract surgeries done  Flu vaccine-fall  Tetanus vaccine  Tdap 2/12 Pneumovax-utd Zoster vaccine-interested in the shingrix  Covid vaccines -incl booster  Falls-none  Fractures-none  Supplements-multiple and ca and D  Exercise -walking and some stretching and yardwork and pool work  Aims to return to the gym when wife is recovered  Prostate cancer screening H/o BPH and abn prostate exam in past  Lab Results  Component Value Date   PSA 2.63 02/08/2021   PSA 2.52  12/17/2019   PSA 2.60 10/14/2018   Sees urol/onc-[facility]  Had yearly exam this spring   Advance directive-up to date  Cognitive function addressed- see scanned forms- and if abnormal then additional documentation follows.  No big changes  Not getting confused or lost   PMH and SH reviewed  Meds, vitals, and allergies reviewed.   ROS: See HPI.  Otherwise negative.    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   Wife had knee surgery this spring and he is helping with her recovery/ doing great   Weight : Wt Readings from Last 3 Encounters:  02/17/21 175 lb (79.4 kg)  12/22/19 175 lb 9 oz (79.6 kg)  12/15/19 180 lb (81.6 kg)   25.11 kg/m   Hearing/vision: Sees ENT for menieres and hearing /utd Hearing aide-doing well    Eye/vision exam - utd, is having cataract surgery soon   PHQ score-0 Mood is good    H/o aortic aneurysm Also a flutter-on eliquis  Calan 80 mg bid  for HTN  HTN (per cardiology)  BP Readings from Last 3 Encounters:  02/17/21 110/72  12/22/19 124/72  12/15/19 116/86     Hctz 25 mg daily Calan sr 120 mg daily  Pulse Readings from Last 3 Encounters:  02/17/21 75  12/22/19 (!) 118  12/15/19 (!) 129    Meniere dz-doing well /no recent episodes  F/u is planned for next mo for hearing test at ENT Takes hctz (also klor con)   Hyperlipidemia  Lab Results  Component Value Date   CHOL 172 02/08/2021   CHOL 182 12/17/2019   CHOL 166 10/14/2018   Lab Results  Component Value Date   HDL 76.70 02/08/2021   HDL 64.90 12/17/2019   HDL 56.90 10/14/2018   Lab Results  Component Value Date   LDLCALC 81 02/08/2021   LDLCALC 98 12/17/2019   LDLCALC 83 10/14/2018   Lab Results  Component Value Date   TRIG 74.0 02/08/2021   TRIG 95.0 12/17/2019   TRIG 132.0 10/14/2018   Lab Results  Component Value Date   CHOLHDL 2 02/08/2021   CHOLHDL 3 12/17/2019   CHOLHDL 3  10/14/2018   Lab Results  Component Value Date   LDLDIRECT 129.8 05/22/2013   LDLDIRECT 106.2 04/08/2012   LDLDIRECT 142.7 10/19/2009   Pravastatin 80 mg daily  He eats oats every day    Prediabetes Lab Results  Component Value Date   HGBA1C 5.6 02/08/2021  He is mindful of sugar in diet    Lab Results  Component Value Date   WBC 5.8 02/08/2021   HGB 15.4 02/08/2021   HCT 44.9 02/08/2021   MCV 91.1 02/08/2021   PLT 335.0 02/08/2021   Lab Results  Component Value Date   TSH 1.08 02/08/2021    Lab Results  Component Value Date   CREATININE 0.66 02/08/2021   BUN 12 02/08/2021   NA 138 02/08/2021   K 3.8 02/08/2021   CL 102 02/08/2021   CO2 27 02/08/2021   Lab Results  Component Value Date   ALT 25 02/08/2021   AST 23 02/08/2021   ALKPHOS 50 02/08/2021   BILITOT 0.7 02/08/2021   L outer hip pain occ  Spot on the L cheek in front of ear won't heal  Bunion on R foot, callus and abn appearing toenail  Patient Active Problem List   Diagnosis Date Noted   Essential hypertension 02/17/2021   Medicare annual wellness visit, subsequent 02/17/2021   Tachycardia 12/22/2019   Welcome to Medicare preventive visit 10/21/2018   BPH (benign prostatic hyperplasia) 10/21/2018   Snoring 08/21/2016   Prediabetes 07/08/2015   Actinic keratoses 07/14/2014   Aortic aneurysm ([place]) 07/14/2014   Personal history of colonic adenoma 09/18/2012   Abnormal prostate exam 09/17/2012   Colon cancer screening 05/22/2012   Meniere disease 01/15/2012   ANA positive 01/15/2012   Adverse effects of medication 01/15/2012   Vertigo 12/15/2011   Atrial flutter ([place]) 12/15/2011   Routine general medical examination at a health care facility 10/03/2011   Prostate cancer screening 10/03/2011   ALLERGIC RHINITIS 04/08/2008   HYPERCHOLESTEROLEMIA 01/09/2008   Past Medical History:  Diagnosis Date   Aortic aneurysm ([place])    4 cm- watched by cardiology per pt   Atrial flutter ([place])     Cataract    Hyperlipidemia    Meniere disease    Seasonal allergies    Past Surgical History:  Procedure Laterality Date   ABLATION  06/2019   heart   bilateral inguinal hernia  1990   lipoma abdomen  2000   revision hernia     2000 and 2006. Revision of inguinal hernia   TOOTH EXTRACTION     Social History   Tobacco Use   Smoking status: Never   Smokeless tobacco: Never  Vaping Use   Vaping Use: Never used  Substance Use Topics   Alcohol use: No    Alcohol/week: 0.0 standard drinks   Drug use: No   Family  History  Problem Relation Age of Onset   Abnormal newborn screen Sister    Colon cancer Neg Hx    Stomach cancer Neg Hx    No Known Allergies Current Outpatient Medications on File Prior to Visit  Medication Sig Dispense Refill   10894 Bestahistine (Serc) - 16 mg PO TID     apixaban (ELIQUIS) 5 MG TABS tablet Take by mouth.     Ascorbic Acid (VITAMIN C) 1000 MG tablet Take 1,000 mg by mouth daily.     calcium gluconate 650 MG tablet Take 650 mg by mouth daily.     cholecalciferol (VITAMIN D) 1000 UNITS tablet Take 4,000 Units by mouth daily.     co-enzyme Q-10 50 MG capsule Take 50 mg by mouth daily.     fish oil-omega-3 fatty acids 1000 MG capsule Take 1 g by mouth daily.     hydrochlorothiazide (HYDRODIURIL) 25 MG tablet Take by mouth.     Multiple Vitamin (MULTIVITAMIN) capsule Take 1 capsule by mouth daily.     NONFORMULARY OR COMPOUNDED ITEM Take 1 capsule by mouth 3 (three) times daily. BETAHISTINE     Potassium Chloride Crys CR (KLOR-CON M20 PO) Take 1 tablet by mouth daily.     verapamil (CALAN-SR) 120 MG CR tablet Take by mouth.     No current facility-administered medications on file prior to visit.    Review of Systems  Constitutional:  Negative for activity change, appetite change, fatigue, fever and unexpected weight change.  HENT:  Positive for hearing loss. Negative for congestion, rhinorrhea, sore throat and trouble swallowing.   Eyes:  Negative  for pain, redness, itching and visual disturbance.  Respiratory:  Negative for cough, chest tightness, shortness of breath and wheezing.   Cardiovascular:  Negative for chest pain and palpitations.  Gastrointestinal:  Negative for abdominal pain, blood in stool, constipation, diarrhea and nausea.  Endocrine: Negative for cold intolerance, heat intolerance, polydipsia and polyuria.  Genitourinary:  Negative for difficulty urinating, dysuria, frequency and urgency.  Musculoskeletal:  Negative for arthralgias, joint swelling and myalgias.       R foot medial bunion with callus  Thickened nail  L outer hip pain   Skin:  Negative for pallor and rash.  Neurological:  Negative for dizziness, tremors, weakness, numbness and headaches.  Hematological:  Negative for adenopathy. Does not bruise/bleed easily.  Psychiatric/Behavioral:  Negative for decreased concentration and dysphoric mood. The patient is not nervous/anxious.       Objective:   Physical Exam Constitutional:      General: He is not in acute distress.    Appearance: Normal appearance. He is well-developed and normal weight. He is not ill-appearing or diaphoretic.  HENT:     Head: Normocephalic and atraumatic.     Right Ear: Tympanic membrane, ear canal and external ear normal.     Left Ear: Tympanic membrane, ear canal and external ear normal.     Nose: Nose normal. No congestion.     Mouth/Throat:     Mouth: Mucous membranes are moist.     Pharynx: Oropharynx is clear. No posterior oropharyngeal erythema.  Eyes:     General: No scleral icterus.       Right eye: No discharge.        Left eye: No discharge.     Conjunctiva/sclera: Conjunctivae normal.     Pupils: Pupils are equal, round, and reactive to light.  Neck:     Thyroid: No thyromegaly.     Vascular:  No carotid bruit or JVD.  Cardiovascular:     Rate and Rhythm: Normal rate and regular rhythm.     Pulses: Normal pulses.     Heart sounds: Normal heart sounds.     No gallop.  Pulmonary:     Effort: Pulmonary effort is normal. No respiratory distress.     Breath sounds: Normal breath sounds. No wheezing or rales.     Comments: Good air exch Chest:     Chest wall: No tenderness.  Abdominal:     General: Bowel sounds are normal. There is no distension or abdominal bruit.     Palpations: Abdomen is soft. There is no mass.     Tenderness: There is no abdominal tenderness.     Hernia: No hernia is present.  Musculoskeletal:        General: No tenderness.     Cervical back: Normal range of motion and neck supple. No rigidity. No muscular tenderness.     Right lower leg: No edema.     Left lower leg: No edema.  Lymphadenopathy:     Cervical: No cervical adenopathy.  Skin:    General: Skin is warm and dry.     Coloration: Skin is not pale.     Findings: No erythema or rash.     Comments: Tanned  Some lentigines AK on L cheek with scab  Callus under first toe on R foot  Neurological:     Mental Status: He is alert.     Cranial Nerves: No cranial nerve deficit.     Motor: No abnormal muscle tone.     Coordination: Coordination normal.     Gait: Gait normal.     Deep Tendon Reflexes: Reflexes are normal and symmetric. Reflexes normal.  Psychiatric:        Mood and Affect: Mood normal.        Cognition and Memory: Cognition and memory normal.          Assessment & Plan:   Problem List Items Addressed This Visit       Cardiovascular and Mediastinum   Atrial flutter (HCC)    Under care of cardiology  Taking eliquis and calan sr 120 mg  Nl rate today        Relevant Medications   hydrochlorothiazide (HYDRODIURIL) 25 MG tablet   verapamil (CALAN-SR) 120 MG CR tablet   pravastatin (PRAVACHOL) 80 MG tablet   Aortic aneurysm (HCC)    Up to date with f/u/cardiology and clinically stable        Relevant Medications   hydrochlorothiazide (HYDRODIURIL) 25 MG tablet   verapamil (CALAN-SR) 120 MG CR tablet   pravastatin (PRAVACHOL)  80 MG tablet   Essential hypertension    bp in fair control at this time  BP Readings from Last 1 Encounters:  02/17/21 110/72  No changes needed Most recent labs reviewed  Disc lifstyle change with low sodium diet and exercise  Plan to continue hctz 25 mg daily and calan xr 120 mg daily       Relevant Medications   hydrochlorothiazide (HYDRODIURIL) 25 MG tablet   verapamil (CALAN-SR) 120 MG CR tablet   pravastatin (PRAVACHOL) 80 MG tablet     Nervous and Auditory   Meniere disease    Doing fairly well with hctz  Continues f/u with ENT and wears hearing aide in L ear          Musculoskeletal and Integument   Actinic keratoses    Scab near  L ear  Pt plans to see dermatology Stressed imp of sun protection          Genitourinary   BPH (benign prostatic hyperplasia)    Continues f/u with urology for this and psa  No clinical changes          Other   HYPERCHOLESTEROLEMIA    LDL of 81 -improved Disc goals for lipids and reasons to control them Rev last labs with pt Rev low sat fat diet in detail Plan to continue pravastatin 80 mg daily        Relevant Medications   hydrochlorothiazide (HYDRODIURIL) 25 MG tablet   verapamil (CALAN-SR) 120 MG CR tablet   pravastatin (PRAVACHOL) 80 MG tablet   Routine general medical examination at a health care facility    Reviewed health habits including diet and exercise and skin cancer prevention Reviewed appropriate screening tests for age  Also reviewed health mt list, fam hx and immunization status , as well as social and family history   See HPI Labs reviewed  Colonoscopy ref done  Pt will investigate tetanus shot at pharmacy  covid vaccinated with booster No falls or fractures  Interested in shingrix vaccine if affordable  Stable psa Advance directive utd No cognitive converns Continues hearing aid and vision/eye care        Colon cancer screening    Due for recall colonoscopy  Ref made        Relevant  Orders   Ambulatory referral to Gastroenterology   Personal history of colonic adenoma    Ref made for recall colonoscopy       Relevant Orders   Ambulatory referral to Gastroenterology   Prediabetes    Lab Results  Component Value Date   HGBA1C 5.6 02/08/2021  disc imp of low glycemic diet and wt loss to prevent DM2        Medicare annual wellness visit, subsequent - Primary    Reviewed health habits including diet and exercise and skin cancer prevention Reviewed appropriate screening tests for age  Also reviewed health mt list, fam hx and immunization status , as well as social and family history   See HPI Labs reviewed  Colonoscopy ref done  Pt will investigate tetanus shot at pharmacy  covid vaccinated with booster No falls or fractures  Interested in shingrix vaccine if affordable  Stable psa Advance directive utd No cognitive converns Continues hearing aid and vision/eye care

## 2021-02-17 NOTE — Assessment & Plan Note (Signed)
Doing fairly well with hctz  Continues f/u with ENT and wears hearing aide in L ear

## 2021-02-17 NOTE — Assessment & Plan Note (Signed)
Lab Results  Component Value Date   HGBA1C 5.6 02/08/2021   disc imp of low glycemic diet and wt loss to prevent DM2

## 2021-02-17 NOTE — Assessment & Plan Note (Signed)
Ref made for recall colonoscopy

## 2021-02-17 NOTE — Assessment & Plan Note (Signed)
Under care of cardiology  Taking eliquis and calan sr 120 mg  Nl rate today

## 2021-02-17 NOTE — Assessment & Plan Note (Signed)
Reviewed health habits including diet and exercise and skin cancer prevention Reviewed appropriate screening tests for age  Also reviewed health mt list, fam hx and immunization status , as well as social and family history   See HPI Labs reviewed  Colonoscopy ref done  Pt will investigate tetanus shot at pharmacy  covid vaccinated with booster No falls or fractures  Interested in shingrix vaccine if affordable  Stable psa Advance directive utd No cognitive converns Continues hearing aid and vision/eye care

## 2021-02-17 NOTE — Assessment & Plan Note (Signed)
LDL of 81 -improved Disc goals for lipids and reasons to control them Rev last labs with pt Rev low sat fat diet in detail Plan to continue pravastatin 80 mg daily

## 2021-02-17 NOTE — Assessment & Plan Note (Signed)
bp in fair control at this time  BP Readings from Last 1 Encounters:  02/17/21 110/72   No changes needed Most recent labs reviewed  Disc lifstyle change with low sodium diet and exercise  Plan to continue hctz 25 mg daily and calan xr 120 mg daily

## 2021-02-17 NOTE — Patient Instructions (Addendum)
If you are interested in the new shingles vaccine (Shingrix) - call your local pharmacy to check on coverage and availability  If affordable, get on a wait list at your pharmacy to get the vaccine.  I placed a colonoscopy referral so you will get a call   You can see about a tetanus shot in the pharmacy   For cholesterol Avoid red meat/ fried foods/ egg yolks/ fatty breakfast meats/ butter, cheese and high fat dairy/ and shellfish   If you want a referral to podiatry for the bunion and callus and nail let me know   See the dermatologist as planned

## 2021-02-17 NOTE — Assessment & Plan Note (Signed)
Scab near L ear  Pt plans to see dermatology Stressed imp of sun protection

## 2021-02-17 NOTE — Assessment & Plan Note (Signed)
Due for recall colonoscopy  Ref made

## 2021-02-17 NOTE — Assessment & Plan Note (Signed)
Continues f/u with urology for this and psa  No clinical changes

## 2021-02-17 NOTE — Assessment & Plan Note (Signed)
Up to date with f/u/cardiology and clinically stable

## 2021-02-18 ENCOUNTER — Telehealth: Payer: Self-pay | Admitting: Family Medicine

## 2021-02-18 NOTE — Telephone Encounter (Signed)
Marvin Pacheco called in and stated that he needs a tetanus shot he was out in the yard and was messing with some sticks and cut his arm. And the cut he stated his not bad but he needs the shot.

## 2021-02-18 NOTE — Telephone Encounter (Signed)
Patient notified as instructed by telephone and verbalized understanding. Patient stated that he went ahead and cleaned the area after our previous conversation. Patient stated that he has to pick up a script today at [facility] and will see about getting a tetanus vaccine at that time. Patient was given UC/ER precautions and he verbalized understanding.

## 2021-02-18 NOTE — Telephone Encounter (Signed)
Patient stated that he cut his arm today working outside. Patient stated that he cut his arm on a dirty stick kind of like a stake in the ground. Patient wants to know what he needs to do since he is due a tetanus vaccine. Patient stated that it is not bad enough to have to see a doctor.

## 2021-02-18 NOTE — Telephone Encounter (Signed)
I do advise a tetanus vaccine  If no nurse visits available today then he can go to a pharmacy or UC  Keep wound clean with soap and water and loosely cover Abx ointment is fine  If redness/drainage or pain please f/u

## 2021-02-24 DIAGNOSIS — G4730 Sleep apnea, unspecified: Secondary | ICD-10-CM | POA: Diagnosis not present

## 2021-02-24 DIAGNOSIS — I480 Paroxysmal atrial fibrillation: Secondary | ICD-10-CM | POA: Diagnosis not present

## 2021-02-24 DIAGNOSIS — I10 Essential (primary) hypertension: Secondary | ICD-10-CM | POA: Diagnosis not present

## 2021-02-24 DIAGNOSIS — H2511 Age-related nuclear cataract, right eye: Secondary | ICD-10-CM | POA: Diagnosis not present

## 2021-02-24 DIAGNOSIS — H903 Sensorineural hearing loss, bilateral: Secondary | ICD-10-CM | POA: Diagnosis not present

## 2021-02-24 DIAGNOSIS — H25811 Combined forms of age-related cataract, right eye: Secondary | ICD-10-CM | POA: Diagnosis not present

## 2021-02-24 DIAGNOSIS — Z7982 Long term (current) use of aspirin: Secondary | ICD-10-CM | POA: Diagnosis not present

## 2021-02-24 DIAGNOSIS — Z7901 Long term (current) use of anticoagulants: Secondary | ICD-10-CM | POA: Diagnosis not present

## 2021-02-24 DIAGNOSIS — Z7952 Long term (current) use of systemic steroids: Secondary | ICD-10-CM | POA: Diagnosis not present

## 2021-02-24 DIAGNOSIS — Z87891 Personal history of nicotine dependence: Secondary | ICD-10-CM | POA: Diagnosis not present

## 2021-03-10 DIAGNOSIS — G4730 Sleep apnea, unspecified: Secondary | ICD-10-CM | POA: Diagnosis not present

## 2021-03-10 DIAGNOSIS — Z7982 Long term (current) use of aspirin: Secondary | ICD-10-CM | POA: Diagnosis not present

## 2021-03-10 DIAGNOSIS — Z87891 Personal history of nicotine dependence: Secondary | ICD-10-CM | POA: Diagnosis not present

## 2021-03-10 DIAGNOSIS — Z9841 Cataract extraction status, right eye: Secondary | ICD-10-CM | POA: Diagnosis not present

## 2021-03-10 DIAGNOSIS — I480 Paroxysmal atrial fibrillation: Secondary | ICD-10-CM | POA: Diagnosis not present

## 2021-03-10 DIAGNOSIS — Z961 Presence of intraocular lens: Secondary | ICD-10-CM | POA: Diagnosis not present

## 2021-03-10 DIAGNOSIS — I10 Essential (primary) hypertension: Secondary | ICD-10-CM | POA: Diagnosis not present

## 2021-03-10 DIAGNOSIS — Z7901 Long term (current) use of anticoagulants: Secondary | ICD-10-CM | POA: Diagnosis not present

## 2021-03-10 DIAGNOSIS — H2512 Age-related nuclear cataract, left eye: Secondary | ICD-10-CM | POA: Diagnosis not present

## 2021-04-20 DIAGNOSIS — I480 Paroxysmal atrial fibrillation: Secondary | ICD-10-CM | POA: Diagnosis not present

## 2021-04-20 DIAGNOSIS — I483 Typical atrial flutter: Secondary | ICD-10-CM | POA: Diagnosis not present

## 2021-04-20 DIAGNOSIS — I4819 Other persistent atrial fibrillation: Secondary | ICD-10-CM | POA: Diagnosis not present

## 2021-04-20 DIAGNOSIS — Z8679 Personal history of other diseases of the circulatory system: Secondary | ICD-10-CM | POA: Diagnosis not present

## 2021-04-20 DIAGNOSIS — E785 Hyperlipidemia, unspecified: Secondary | ICD-10-CM | POA: Diagnosis not present

## 2021-05-03 DIAGNOSIS — L814 Other melanin hyperpigmentation: Secondary | ICD-10-CM | POA: Diagnosis not present

## 2021-05-03 DIAGNOSIS — D492 Neoplasm of unspecified behavior of bone, soft tissue, and skin: Secondary | ICD-10-CM | POA: Diagnosis not present

## 2021-05-03 DIAGNOSIS — L570 Actinic keratosis: Secondary | ICD-10-CM | POA: Diagnosis not present

## 2021-05-03 DIAGNOSIS — Z1283 Encounter for screening for malignant neoplasm of skin: Secondary | ICD-10-CM | POA: Diagnosis not present

## 2021-05-03 DIAGNOSIS — C44319 Basal cell carcinoma of skin of other parts of face: Secondary | ICD-10-CM | POA: Diagnosis not present

## 2021-05-03 DIAGNOSIS — D235 Other benign neoplasm of skin of trunk: Secondary | ICD-10-CM | POA: Diagnosis not present

## 2021-05-03 DIAGNOSIS — L219 Seborrheic dermatitis, unspecified: Secondary | ICD-10-CM | POA: Diagnosis not present

## 2021-05-03 DIAGNOSIS — L821 Other seborrheic keratosis: Secondary | ICD-10-CM | POA: Diagnosis not present

## 2021-06-09 DIAGNOSIS — L988 Other specified disorders of the skin and subcutaneous tissue: Secondary | ICD-10-CM | POA: Diagnosis not present

## 2021-06-09 DIAGNOSIS — C44319 Basal cell carcinoma of skin of other parts of face: Secondary | ICD-10-CM | POA: Diagnosis not present

## 2021-06-09 DIAGNOSIS — L814 Other melanin hyperpigmentation: Secondary | ICD-10-CM | POA: Diagnosis not present

## 2021-06-09 DIAGNOSIS — L578 Other skin changes due to chronic exposure to nonionizing radiation: Secondary | ICD-10-CM | POA: Diagnosis not present

## 2021-09-27 ENCOUNTER — Encounter: Payer: Self-pay | Admitting: Family Medicine

## 2021-11-08 DIAGNOSIS — L821 Other seborrheic keratosis: Secondary | ICD-10-CM | POA: Diagnosis not present

## 2021-11-08 DIAGNOSIS — Z872 Personal history of diseases of the skin and subcutaneous tissue: Secondary | ICD-10-CM | POA: Diagnosis not present

## 2021-11-08 DIAGNOSIS — L820 Inflamed seborrheic keratosis: Secondary | ICD-10-CM | POA: Diagnosis not present

## 2021-11-08 DIAGNOSIS — L570 Actinic keratosis: Secondary | ICD-10-CM | POA: Diagnosis not present

## 2021-11-08 DIAGNOSIS — D492 Neoplasm of unspecified behavior of bone, soft tissue, and skin: Secondary | ICD-10-CM | POA: Diagnosis not present

## 2021-11-14 ENCOUNTER — Ambulatory Visit: Payer: Medicare PPO | Admitting: Internal Medicine

## 2021-11-14 ENCOUNTER — Encounter: Payer: Self-pay | Admitting: Internal Medicine

## 2021-11-14 ENCOUNTER — Telehealth: Payer: Self-pay

## 2021-11-14 VITALS — BP 114/68 | HR 65 | Ht 71.5 in | Wt 183.0 lb

## 2021-11-14 DIAGNOSIS — Z86010 Personal history of colonic polyps: Secondary | ICD-10-CM

## 2021-11-14 DIAGNOSIS — Z7901 Long term (current) use of anticoagulants: Secondary | ICD-10-CM

## 2021-11-14 DIAGNOSIS — I4892 Unspecified atrial flutter: Secondary | ICD-10-CM

## 2021-11-14 NOTE — Patient Instructions (Addendum)
If you are age 74 or older, your body mass index should be between 23-30. Your Body mass index is 25.17 kg/m?Marland Kitchen If this is out of the aforementioned range listed, please consider follow up with your Primary Care Provider. ? ?________________________________________________________ ? ?The [facility] providers would like to encourage you to use [facility] to communicate with providers for non-urgent requests or questions.  Due to long hold times on the telephone, sending your provider a message by [facility] may be a faster and more efficient way to get a response.  Please allow 48 business hours for a response.  Please remember that this is for non-urgent requests.  ?_______________________________________________________ ? ? ?You have been scheduled for a colonoscopy. Please follow written instructions given to you at your visit today.  ?Please pick up your prep supplies at the pharmacy within the next 1-3 days. ?If you use inhalers (even only as needed), please bring them with you on the day of your procedure. ? ? ?I appreciate the opportunity to care for you. ?Silvano Rusk, MD, [facility] ? ?

## 2021-11-14 NOTE — Progress Notes (Signed)
? ?Marvin Pacheco 76 y.o. 1945-10-09 [id number] ? ?Assessment & Plan:  ? ?Encounter Diagnoses  ?Name Primary?  ? Personal history of colonic adenoma Yes  ? Atrial flutter, unspecified type ([place])   ? Long term current use of anticoagulant-Eliquis-A-fib flutter   ? ?He is an appropriate candidate for surveillance colonoscopy.  We will hold Eliquis the day before and the day of procedure.  Clarify with cardiologist.  Rare but real risk of stroke off Eliquis explained to the patient and he understands and accepts this.  Traditional endoscopy risks explained to the patient as well.  Informed consent sheet reviewed. ? ? ?CC: Tower, Wynelle Fanny, MD ? ? ?Subjective:  ? ?Chief Complaint: History of colon polyp, surveillance colonoscopy ? ?HPI ?76 year old man with a history of a 5 mm adenoma in 2014, on Eliquis for atrial fibrillation.  He presents for discussion of repeat colonoscopy for surveillance.  He is not having any active GI symptoms.  There is concerned about sedation and his A-fib issues.  His wife is quite concerned.  They report they do not want any observers during any procedures he might have.  He has had some increased belching and his wife thinks he eats too fast.  GI review of systems is otherwise negative. ? ?He is status post 2 A-fib flutter ablations in [facility] by Dr. Rafael Bihari, 2020 and 2021.  He remains on Eliquis for stroke prophylaxis. ?Allergies  ?Allergen Reactions  ? Pepcid Ac [Famotidine]   ?  Abdominal cramping  ? ?Current Meds  ?Medication Sig  ? apixaban (ELIQUIS) 5 MG TABS tablet Take 5 mg by mouth 2 (two) times daily.  ? Ascorbic Acid (VITAMIN C) 1000 MG tablet Take 1,000 mg by mouth daily.  ? calcium gluconate 650 MG tablet Take 650 mg by mouth daily.  ? cholecalciferol (VITAMIN D) 1000 UNITS tablet Take 4,000 Units by mouth daily.  ? co-enzyme Q-10 50 MG capsule Take 50 mg by mouth daily.  ? fish oil-omega-3 fatty acids 1000 MG capsule Take 1 g by mouth daily.  ? hydrochlorothiazide  (HYDRODIURIL) 25 MG tablet Take by mouth.  ? Multiple Vitamin (MULTIVITAMIN) capsule Take 1 capsule by mouth daily.  ? NONFORMULARY OR COMPOUNDED ITEM Take 1 capsule by mouth in the morning and at bedtime. BETAHISTINE  ? Potassium Chloride Crys CR (KLOR-CON M20 PO) Take 1 tablet by mouth daily.  ? pravastatin (PRAVACHOL) 80 MG tablet Take 1 tablet (80 mg total) by mouth daily.  ? verapamil (CALAN-SR) 120 MG CR tablet Take 120 mg by mouth 2 (two) times daily.  ?  in the morning and at bedtime.  ? ?Past Medical History:  ?Diagnosis Date  ? Aortic aneurysm ([place])   ? 4 cm- watched by cardiology per pt  ? Atrial flutter ([place])   ? Basal cell carcinoma   ? on her face  ? Cataract   ? Hyperlipidemia   ? Meniere disease   ? Pseudophakia   ? Seasonal allergies   ? Sleep apnea   ? ?Past Surgical History:  ?Procedure Laterality Date  ? ABLATION  06/2019  ? Atrial fib flutter. REPEAT 2012  ? bilateral inguinal hernia  1990  ? CATARACT EXTRACTION    ? COLONOSCOPY    ? lipoma abdomen  2000  ? revision hernia    ? 2000 and 2006. Revision of inguinal hernia  ? TOOTH EXTRACTION    ? ?Social History  ? ?Social History Narrative  ? Patient is married, 2 sons 1  daughter  ? 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 Former smoker no alcohol does drink some tea no drug use no tobacco at all now  ? ?family history includes Abnormal newborn screen in his sister. ? ? ?Review of Systems ? ?See HPI.  All other review of systems appear negative though he does have sleep apnea. ?Objective:  ? Physical Exam ?'@BP'$  114/68   Pulse 65   Ht 5' 11.5" (1.816 m)   Wt 183 lb (83 kg)   BMI 25.17 kg/m? @ ? ?General:  Well-developed, well-nourished and in no acute distress ?Eyes:  anicteric. ?Lungs: Clear to auscultation bilaterally. ?Heart:  S1S2, no rubs, murmurs, gallops. ?Abdomen:  soft, non-tender, no hepatosplenomegaly, hernia, or mass and BS+.  ?Rectal: deferred ?Neuro:  A&O x 3.  ?Psych:  appropriate mood and   Affect. ? ? ?Data Reviewed: ?See HPI ?

## 2021-11-14 NOTE — Telephone Encounter (Signed)
Request for surgical clearance:     Endoscopy Procedure ? ?What type of surgery is being performed?     ECL ? ?When is this surgery scheduled?     01/16/2022 ? ?What type of clearance is required ?   Pharmacy ? ?Are there any medications that need to be held prior to surgery and how long? Eliquis, day before and day of proceedure ? ?Practice name and name of physician performing surgery?      [facility] ? ?What is your office phone and fax number?      Phone- [phone]  Fax- [phone] ? ?Anesthesia type (None, local, MAC, general) ?       MAC ? ?

## 2021-11-21 NOTE — Telephone Encounter (Signed)
I informed his wife Neoma Laming that Dr Rafael Bihari said he could hold his Eliquis the day before and the day of the procedure. She verbalized understanding.  ?

## 2021-12-16 ENCOUNTER — Encounter: Payer: Self-pay | Admitting: Internal Medicine

## 2021-12-30 ENCOUNTER — Encounter: Payer: Medicare PPO | Admitting: Internal Medicine

## 2022-01-05 ENCOUNTER — Ambulatory Visit (AMBULATORY_SURGERY_CENTER): Payer: Medicare PPO | Admitting: Internal Medicine

## 2022-01-05 ENCOUNTER — Encounter: Payer: Self-pay | Admitting: Internal Medicine

## 2022-01-05 VITALS — BP 117/66 | HR 70 | Temp 98.2°F | Resp 13 | Ht 71.5 in | Wt 183.0 lb

## 2022-01-05 DIAGNOSIS — D122 Benign neoplasm of ascending colon: Secondary | ICD-10-CM

## 2022-01-05 DIAGNOSIS — G4730 Sleep apnea, unspecified: Secondary | ICD-10-CM | POA: Diagnosis not present

## 2022-01-05 DIAGNOSIS — Z86010 Personal history of colonic polyps: Secondary | ICD-10-CM | POA: Diagnosis not present

## 2022-01-05 DIAGNOSIS — E785 Hyperlipidemia, unspecified: Secondary | ICD-10-CM | POA: Diagnosis not present

## 2022-01-05 MED ORDER — SODIUM CHLORIDE 0.9 % IV SOLN: 500.0000 mL | Freq: Once | INTRAVENOUS | Status: DC

## 2022-01-05 NOTE — Progress Notes (Signed)
Called to room to assist during endoscopic procedure.  Patient ID and intended procedure confirmed with present staff. Received instructions for my participation in the procedure from the performing physician.  

## 2022-01-05 NOTE — Progress Notes (Signed)
To pacu, VSS. Report to Rn.tb 

## 2022-01-05 NOTE — Progress Notes (Signed)
[facility] History and Physical ? ? ?Primary Care Physician:  Tower, Wynelle Fanny, MD ? ? ?Reason for Procedure:   Hx colon polp ? ?Plan:    colonoscopy ? ? ? ? ?HPI: Marvin Pacheco is a 76 y.o. male  with a history of a 5 mm adenoma in 2014, on Eliquis for atrial fibrillation.  He presents for discussion of repeat colonoscopy for surveillance.  He is not having any active GI symptoms.  There is concerned about sedation and his A-fib issues.  His wife is quite concerned.  They report they do not want any observers during any procedures he might have.  He has had some increased belching and his wife thinks he eats too fast.  GI review of systems is otherwise negative. ?  ?He is status post 2 A-fib flutter ablations in [facility] by Dr. Rafael Bihari, 2020 and 2021.  He remains on Eliquis for stroke prophylaxis. ? ? ?Past Medical History:  ?Diagnosis Date  ? Aortic aneurysm ([place])   ? 4 cm- watched by cardiology per pt  ? Atrial flutter ([place])   ? Basal cell carcinoma   ? on her face  ? Cataract   ? Hyperlipidemia   ? Meniere disease   ? Pseudophakia   ? Seasonal allergies   ? Sleep apnea   ? ? ?Past Surgical History:  ?Procedure Laterality Date  ? ABLATION  06/2019  ? Atrial fib flutter. REPEAT 2012  ? bilateral inguinal hernia  1990  ? CATARACT EXTRACTION    ? COLONOSCOPY    ? lipoma abdomen  2000  ? revision hernia    ? 2000 and 2006. Revision of inguinal hernia  ? TOOTH EXTRACTION    ? ? ?Prior to Admission medications   ?Medication Sig Start Date End Date Taking? Authorizing Provider  ?Ascorbic Acid (VITAMIN C) 1000 MG tablet Take 1,000 mg by mouth daily.   Yes [provider]  ?calcium gluconate 650 MG tablet Take 650 mg by mouth daily.   Yes [provider]  ?cholecalciferol (VITAMIN D) 1000 UNITS tablet Take 4,000 Units by mouth daily.   Yes [provider]  ?co-enzyme Q-10 50 MG capsule Take 50 mg by mouth daily.   Yes [provider]  ?fish oil-omega-3 fatty acids 1000 MG  capsule Take 1 g by mouth daily.   Yes [provider]  ?hydrochlorothiazide (HYDRODIURIL) 25 MG tablet Take by mouth. 01/26/20 01/21/22 Yes [provider]  ?Multiple Vitamin (MULTIVITAMIN) capsule Take 1 capsule by mouth daily.   Yes [provider]  ?NONFORMULARY OR COMPOUNDED ITEM Take 1 capsule by mouth in the morning and at bedtime. BETAHISTINE   Yes [provider]  ?Potassium Chloride Crys CR (KLOR-CON M20 PO) Take 1 tablet by mouth daily.   Yes [provider]  ?pravastatin (PRAVACHOL) 80 MG tablet Take 1 tablet (80 mg total) by mouth daily. 02/17/21  Yes Tower, Wynelle Fanny, MD  ?verapamil (CALAN-SR) 120 MG CR tablet Take 120 mg by mouth 2 (two) times daily. 12/08/20  Yes [provider]  ?apixaban (ELIQUIS) 5 MG TABS tablet Take 5 mg by mouth 2 (two) times daily. 09/19/18   [provider]  ? ? ?Current Outpatient Medications  ?Medication Sig Dispense Refill  ? Ascorbic Acid (VITAMIN C) 1000 MG tablet Take 1,000 mg by mouth daily.    ? calcium gluconate 650 MG tablet Take 650 mg by mouth daily.    ? cholecalciferol (VITAMIN D) 1000 UNITS tablet Take  4,000 Units by mouth daily.    ? co-enzyme Q-10 50 MG capsule Take 50 mg by mouth daily.    ? fish oil-omega-3 fatty acids 1000 MG capsule Take 1 g by mouth daily.    ? hydrochlorothiazide (HYDRODIURIL) 25 MG tablet Take by mouth.    ? Multiple Vitamin (MULTIVITAMIN) capsule Take 1 capsule by mouth daily.    ? NONFORMULARY OR COMPOUNDED ITEM Take 1 capsule by mouth in the morning and at bedtime. BETAHISTINE    ? Potassium Chloride Crys CR (KLOR-CON M20 PO) Take 1 tablet by mouth daily.    ? pravastatin (PRAVACHOL) 80 MG tablet Take 1 tablet (80 mg total) by mouth daily. 90 tablet 3  ? verapamil (CALAN-SR) 120 MG CR tablet Take 120 mg by mouth 2 (two) times daily.    ? apixaban (ELIQUIS) 5 MG TABS tablet Take 5 mg by mouth 2 (two) times daily.    ? ?Current Facility-Administered Medications  ?Medication Dose  Route Frequency Provider Last Rate Last Admin  ? 0.9 %  sodium chloride infusion  500 mL Intravenous Once Gatha Mayer, MD      ? ? ?Allergies as of 01/05/2022 - Review Complete 01/05/2022  ?Allergen Reaction Noted  ? Pepcid ac [famotidine]  11/14/2021  ? ? ?Family History  ?Problem Relation Age of Onset  ? Abnormal newborn screen Sister   ? Colon cancer Neg Hx   ? Stomach cancer Neg Hx   ? ? ?Social History  ? ?Socioeconomic History  ? Marital status: Married  ?  Spouse name: Not on file  ? Number of children: Not on file  ? Years of education: Not on file  ? Highest education level: Not on file  ?Occupational History  ? Not on file  ?Tobacco Use  ? Smoking status: Never  ? Smokeless tobacco: Never  ?Vaping Use  ? Vaping Use: Never used  ?Substance and Sexual Activity  ? Alcohol use: No  ?  Alcohol/week: 0.0 standard drinks  ? Drug use: No  ? Sexual activity: Not on file  ?Other Topics Concern  ? Not on file  ?Social History Narrative  ? Patient is married, 2 sons 1 daughter  ? 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 Former smoker no alcohol does drink some tea no drug use no tobacco at all now  ? ?Social Determinants of Health  ? ?Financial Resource Strain: Not on file  ?Food Insecurity: Not on file  ?Transportation Needs: Not on file  ?Physical Activity: Not on file  ?Stress: Not on file  ?Social Connections: Not on file  ?Intimate Partner Violence: Not on file  ? ? ?Review of Systems: ? ?All other review of systems negative except as mentioned in the HPI. ? ?Physical Exam: ?Vital signs ?BP 117/65   Pulse 69   Temp 98.2 ?F (36.8 ?C) (Temporal)   Ht 5' 11.5" (1.816 m)   Wt 183 lb (83 kg)   SpO2 98%   BMI 25.17 kg/m?  ? ?General:   Alert,  Well-developed, well-nourished, pleasant and cooperative in NAD ?Lungs:  Clear throughout to auscultation.   ?Heart:  Regular rate and rhythm; no murmurs, clicks, rubs,  or gallops. ?Abdomen:  Soft, nontender and nondistended. Normal bowel  sounds.   ?Neuro/Psych:  Alert and cooperative. Normal mood and affect. A and O x 3 ? ? ?'@Naftuli Dalsanto'$  Simonne Maffucci, MD, [facility] ?[facility] ?[phone] (pager) ?01/05/2022 11:06 AM@ ? ?

## 2022-01-05 NOTE — Progress Notes (Signed)
I have reviewed the patient's medical history in detail and updated the computerized patient record.   VS BY DT. 

## 2022-01-05 NOTE — Op Note (Signed)
[facility] ?Patient Name: Marvin Pacheco ?Procedure Date: 01/05/2022 11:05 AM ?MRN: [id number] ?Endoscopist: Gatha Mayer , MD ?Age: 76 ?Referring MD:  ?Date of Birth: Sep 23, 1945 ?Gender: Male ?Account #: [id number] ?Procedure:                Colonoscopy ?Indications:              Surveillance: Personal history of adenomatous  ?                          polyps on last colonoscopy > 5 years ago ?Medicines:                Monitored Anesthesia Care ?Procedure:                Pre-Anesthesia Assessment: ?                          - Prior to the procedure, a History and Physical  ?                          was performed, and patient medications and  ?                          allergies were reviewed. The patient's tolerance of  ?                          previous anesthesia was also reviewed. The risks  ?                          and benefits of the procedure and the sedation  ?                          options and risks were discussed with the patient.  ?                          All questions were answered, and informed consent  ?                          was obtained. Prior Anticoagulants: The patient  ?                          last took Eliquis (apixaban) 2 days prior to the  ?                          procedure. ASA Grade Assessment: III - A patient  ?                          with severe systemic disease. After reviewing the  ?                          risks and benefits, the patient was deemed in  ?                          satisfactory condition to undergo the procedure. ?  After obtaining informed consent, the colonoscope  ?                          was passed under direct vision. Throughout the  ?                          procedure, the patient's blood pressure, pulse, and  ?                          oxygen saturations were monitored continuously. The  ?                          Colonoscope was introduced through the anus and  ?                          advanced to the the  cecum, identified by  ?                          appendiceal orifice and ileocecal valve. The  ?                          colonoscopy was performed without difficulty. The  ?                          patient tolerated the procedure well. The quality  ?                          of the bowel preparation was good. The ileocecal  ?                          valve, appendiceal orifice, and rectum were  ?                          photographed. ?Scope In: 11:17:50 AM ?Scope Out: 11:33:10 AM ?Scope Withdrawal Time: 0 hours 12 minutes 30 seconds  ?Total Procedure Duration: 0 hours 15 minutes 20 seconds  ?Findings:                 The digital rectal exam findings include prostate  ?                          nodule. ?                          A diminutive polyp was found in the ascending  ?                          colon. The polyp was sessile. The polyp was removed  ?                          with a cold snare. Resection and retrieval were  ?                          complete. Verification of patient identification  ?  for the specimen was done. Estimated blood loss was  ?                          minimal. ?                          The exam was otherwise without abnormality on  ?                          direct and retroflexion views. ?Complications:            No immediate complications. ?Estimated Blood Loss:     Estimated blood loss was minimal. ?Impression:               - A prostate nodule found on digital rectal exam.  ?                          Enlarged also - known and prior biopsies ?                          - One diminutive polyp in the ascending colon,  ?                          removed with a cold snare. Resected and retrieved. ?                          - The examination was otherwise normal on direct  ?                          and retroflexion views. ?                          - Personal history of colonic polyp - diminutive  ?                          adenoma 2014. ?Recommendation:            - Patient has a contact number available for  ?                          emergencies. The signs and symptoms of potential  ?                          delayed complications were discussed with the  ?                          patient. Return to normal activities tomorrow.  ?                          Written discharge instructions were provided to the  ?                          patient. ?                          - Resume previous diet. ?                          -  Resume Eliquis (apixaban) at prior dose tomorrow. ?                          - Await pathology results. ?                          - No repeat colonoscopy due to age. ?Gatha Mayer, MD ?01/05/2022 11:45:26 AM ?This report has been signed electronically. ?

## 2022-01-05 NOTE — Patient Instructions (Addendum)
I found and removed one tiny polyp that looks benign. ?I will get it analyzed by pathologist and let you know what it was. ?I will not be recommending any more routine colonoscopy. ? ?Restart Eliquis tomorrow. ? ?I appreciate the opportunity to care for you. ?Gatha Mayer, MD, [facility] ? ?YOU HAD AN ENDOSCOPIC PROCEDURE TODAY AT [facility]:   Refer to the procedure report that was given to you for any specific questions about what was found during the examination.  If the procedure report does not answer your questions, please call your gastroenterologist to clarify.  If you requested that your care partner not be given the details of your procedure findings, then the procedure report has been included in a sealed envelope for you to review at your convenience later. ? ?YOU SHOULD EXPECT: Some feelings of bloating in the abdomen. Passage of more gas than usual.  Walking can help get rid of the air that was put into your GI tract during the procedure and reduce the bloating. If you had a lower endoscopy (such as a colonoscopy or flexible sigmoidoscopy) you may notice spotting of blood in your stool or on the toilet paper. If you underwent a bowel prep for your procedure, you may not have a normal bowel movement for a few days. ? ?Please Note:  You might notice some irritation and congestion in your nose or some drainage.  This is from the oxygen used during your procedure.  There is no need for concern and it should clear up in a day or so. ? ?SYMPTOMS TO REPORT IMMEDIATELY: ? ?Following lower endoscopy (colonoscopy or flexible sigmoidoscopy): ? Excessive amounts of blood in the stool ? Significant tenderness or worsening of abdominal pains ? Swelling of the abdomen that is new, acute ? Fever of 100?F or higher ? ?For urgent or emergent issues, a gastroenterologist can be reached at any hour by calling [phone]. ?Do not use MyChart messaging for urgent concerns.  ? ? ?DIET:  We do recommend a  small meal at first, but then you may proceed to your regular diet.  Drink plenty of fluids but you should avoid alcoholic beverages for 24 hours. ? ?ACTIVITY:  You should plan to take it easy for the rest of today and you should NOT DRIVE or use heavy machinery until tomorrow (because of the sedation medicines used during the test).   ? ?FOLLOW UP: ?Our staff will call the number listed on your records 48-72 hours following your procedure to check on you and address any questions or concerns that you may have regarding the information given to you following your procedure. If we do not reach you, we will leave a message.  We will attempt to reach you two times.  During this call, we will ask if you have developed any symptoms of COVID 19. If you develop any symptoms (ie: fever, flu-like symptoms, shortness of breath, cough etc.) before then, please call [phone].  If you test positive for Covid 19 in the 2 weeks post procedure, please call and report this information to [place].   ? ?If any biopsies were taken you will be contacted by phone or by letter within the next 1-3 weeks.  Please call us at [phone] if you have not heard about the biopsies in 3 weeks.  ? ? ?SIGNATURES/CONFIDENTIALITY: ?You and/or your care partner have signed paperwork which will be entered into your electronic medical record.  These signatures attest to the fact  that that the information above on your After Visit Summary has been reviewed and is understood.  Full responsibility of the confidentiality of this discharge information lies with you and/or your care-partner.  ? ?

## 2022-01-09 ENCOUNTER — Telehealth: Payer: Self-pay | Admitting: *Deleted

## 2022-01-09 NOTE — Telephone Encounter (Signed)
?  Follow up Call- ? ? ?  01/05/2022  ? 10:36 AM  ?Call back number  ?Post procedure Call Back phone  # [phone]  ?Permission to leave phone message Yes  ?  ? ?Patient questions: ? ?Do you have a fever, pain , or abdominal swelling? No. ?Pain Score  0 * ? ?Have you tolerated food without any problems? Yes.   ? ?Have you been able to return to your normal activities? Yes.   ? ?Do you have any questions about your discharge instructions: ?Diet   No. ?Medications  No. ?Follow up visit  No. ? ?Do you have questions or concerns about your Care? No. ? ?Actions: ?* If pain score is 4 or above: ?No action needed, pain <4. ? ? ?

## 2022-01-12 ENCOUNTER — Encounter: Payer: Self-pay | Admitting: Internal Medicine

## 2022-01-20 DIAGNOSIS — I77810 Thoracic aortic ectasia: Secondary | ICD-10-CM | POA: Diagnosis not present

## 2022-01-20 DIAGNOSIS — I480 Paroxysmal atrial fibrillation: Secondary | ICD-10-CM | POA: Diagnosis not present

## 2022-01-20 DIAGNOSIS — E785 Hyperlipidemia, unspecified: Secondary | ICD-10-CM | POA: Diagnosis not present

## 2022-02-02 DIAGNOSIS — N138 Other obstructive and reflux uropathy: Secondary | ICD-10-CM | POA: Diagnosis not present

## 2022-02-02 DIAGNOSIS — N402 Nodular prostate without lower urinary tract symptoms: Secondary | ICD-10-CM | POA: Diagnosis not present

## 2022-02-02 DIAGNOSIS — N401 Enlarged prostate with lower urinary tract symptoms: Secondary | ICD-10-CM | POA: Diagnosis not present

## 2022-02-07 DIAGNOSIS — Z7901 Long term (current) use of anticoagulants: Secondary | ICD-10-CM | POA: Diagnosis not present

## 2022-02-07 DIAGNOSIS — M79672 Pain in left foot: Secondary | ICD-10-CM | POA: Diagnosis not present

## 2022-02-07 DIAGNOSIS — M79671 Pain in right foot: Secondary | ICD-10-CM | POA: Diagnosis not present

## 2022-02-07 DIAGNOSIS — D2371 Other benign neoplasm of skin of right lower limb, including hip: Secondary | ICD-10-CM | POA: Diagnosis not present

## 2022-02-07 DIAGNOSIS — B351 Tinea unguium: Secondary | ICD-10-CM | POA: Diagnosis not present

## 2022-02-07 DIAGNOSIS — M2012 Hallux valgus (acquired), left foot: Secondary | ICD-10-CM | POA: Diagnosis not present

## 2022-02-13 ENCOUNTER — Other Ambulatory Visit: Payer: Self-pay | Admitting: Family Medicine

## 2022-02-21 ENCOUNTER — Telehealth: Payer: Self-pay | Admitting: Family Medicine

## 2022-02-21 NOTE — Telephone Encounter (Signed)
Spoke with patient to schedule Medicare Annual Wellness Visit (AWV) either virtually or phone   Last AWV 02/17/21 ;  Patient stated he would like a call back end July  a lot going on right now   I left my direct # [phone]

## 2022-03-31 ENCOUNTER — Encounter: Payer: Self-pay | Admitting: Internal Medicine

## 2022-03-31 ENCOUNTER — Ambulatory Visit: Payer: Medicare PPO | Admitting: Internal Medicine

## 2022-03-31 ENCOUNTER — Ambulatory Visit (INDEPENDENT_AMBULATORY_CARE_PROVIDER_SITE_OTHER): Payer: Medicare PPO

## 2022-03-31 VITALS — BP 124/80 | HR 86 | Temp 98.3°F | Resp 17 | Ht 71.5 in | Wt 181.4 lb

## 2022-03-31 DIAGNOSIS — M79672 Pain in left foot: Secondary | ICD-10-CM | POA: Diagnosis not present

## 2022-03-31 DIAGNOSIS — M7989 Other specified soft tissue disorders: Secondary | ICD-10-CM

## 2022-03-31 DIAGNOSIS — S93602A Unspecified sprain of left foot, initial encounter: Secondary | ICD-10-CM | POA: Diagnosis not present

## 2022-03-31 NOTE — Progress Notes (Signed)
Chief Complaint  Patient presents with   Foot Swelling    Pt presents for left foot swelling he hit his foot on a piece of furniture 2-3 nights ago   F/u  1. Left foot swelling 4th toe and midfoot swelling and pain mild to moderate he is able to walk 2-3 nights ago at night he hit his foot on a piece of furniture and initially no pain and swelling now foot is swollen Xray today no fracture but mild swelling he is established with Dr. Samara Deist  He tried ice, rest, elevation at home with his wife     Review of Systems  Musculoskeletal:  Positive for joint pain.   Past Medical History:  Diagnosis Date   Aortic aneurysm ([facility])    4 cm- watched by cardiology per pt   Atrial flutter ([place])    Basal cell carcinoma    on her face   Cataract    Hyperlipidemia    Meniere disease    Pseudophakia    Seasonal allergies    Sleep apnea    Past Surgical History:  Procedure Laterality Date   ABLATION  06/2019   Atrial fib flutter. REPEAT 2012   bilateral inguinal hernia  1990   CATARACT EXTRACTION     COLONOSCOPY     lipoma abdomen  2000   revision hernia     2000 and 2006. Revision of inguinal hernia   TOOTH EXTRACTION     Family History  Problem Relation Age of Onset   Abnormal newborn screen Sister    Colon cancer Neg Hx    Stomach cancer Neg Hx    Social History   Socioeconomic History   Marital status: Married    Spouse name: Not on file   Number of children: Not on file   Years of education: Not on file   Highest education level: Not on file  Occupational History   Not on file  Tobacco Use   Smoking status: Never   Smokeless tobacco: Never  Vaping Use   Vaping Use: Never used  Substance and Sexual Activity   Alcohol use: No    Alcohol/week: 0.0 standard drinks of alcohol   Drug use: No   Sexual activity: Not on file  Other Topics Concern   Not on file  Social History Narrative   Patient is married, 2 sons 1 daughter   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   Former smoker no alcohol does drink some tea no drug use no tobacco at all now   Social Determinants of Radio broadcast assistant Strain: Low Risk  (12/15/2019)   Overall Financial Resource Strain (CARDIA)    Difficulty of Paying Living Expenses: Not hard at all  Food Insecurity: No Food Insecurity (12/15/2019)   Hunger Vital Sign    Worried About Running Out of Food in the Last Year: Never true    Ran Out of Food in the Last Year: Never true  Transportation Needs: No Transportation Needs (12/15/2019)   PRAPARE - Hydrologist (Medical): No    Lack of Transportation (Non-Medical): No  Physical Activity: Sufficiently Active (12/15/2019)   Exercise Vital Sign    Days of Exercise per Week: 7 days    Minutes of Exercise per Session: 50 min  Stress: No Stress Concern Present (12/15/2019)   [place]    Feeling of Stress : Not at  all  Social Connections: Not on file  Intimate Partner Violence: Not At Risk (12/15/2019)   Humiliation, Afraid, Rape, and Kick questionnaire    Fear of Current or Ex-Partner: No    Emotionally Abused: No    Physically Abused: No    Sexually Abused: No   Current Meds  Medication Sig   apixaban (ELIQUIS) 5 MG TABS tablet Take 5 mg by mouth 2 (two) times daily.   Ascorbic Acid (VITAMIN C) 1000 MG tablet Take 1,000 mg by mouth daily.   calcium gluconate 650 MG tablet Take 650 mg by mouth daily.   cholecalciferol (VITAMIN D) 1000 UNITS tablet Take 4,000 Units by mouth daily.   co-enzyme Q-10 50 MG capsule Take 50 mg by mouth daily.   fish oil-omega-3 fatty acids 1000 MG capsule Take 1 g by mouth daily.   Multiple Vitamin (MULTIVITAMIN) capsule Take 1 capsule by mouth daily.   NONFORMULARY OR COMPOUNDED ITEM Take 1 capsule by mouth in the morning and at bedtime. BETAHISTINE   Potassium Chloride Crys CR (KLOR-CON M20 PO) Take 1 tablet by mouth  daily.   pravastatin (PRAVACHOL) 80 MG tablet Take 1 tablet (80 mg total) by mouth daily.   verapamil (CALAN-SR) 120 MG CR tablet Take 120 mg by mouth 2 (two) times daily.   Allergies  Allergen Reactions   Pepcid Ac [Famotidine]     Abdominal cramping   No results found for this or any previous visit (from the past 2160 hour(s)). Objective  Body mass index is 24.95 kg/m. Wt Readings from Last 3 Encounters:  03/31/22 181 lb 6.4 oz (82.3 kg)  01/05/22 183 lb (83 kg)  11/14/21 183 lb (83 kg)   Temp Readings from Last 3 Encounters:  03/31/22 98.3 F (36.8 C) (Oral)  01/05/22 98.2 F (36.8 C) (Temporal)  02/17/21 98.3 F (36.8 C) (Temporal)   BP Readings from Last 3 Encounters:  03/31/22 124/80  01/05/22 117/66  11/14/21 114/68   Pulse Readings from Last 3 Encounters:  03/31/22 86  01/05/22 70  11/14/21 65    Physical Exam Vitals and nursing note reviewed.  Constitutional:      Appearance: Normal appearance. He is well-developed and well-groomed.  HENT:     Head: Normocephalic and atraumatic.  Eyes:     Conjunctiva/sclera: Conjunctivae normal.     Pupils: Pupils are equal, round, and reactive to light.  Cardiovascular:     Rate and Rhythm: Normal rate and regular rhythm.     Heart sounds: Normal heart sounds.  Pulmonary:     Effort: Pulmonary effort is normal. No respiratory distress.     Breath sounds: Normal breath sounds.  Abdominal:     Tenderness: There is no abdominal tenderness.  Musculoskeletal:     Left foot: Swelling, deformity, bunion and tenderness present.       Legs:  Skin:    General: Skin is warm and moist.  Neurological:     General: No focal deficit present.     Mental Status: He is alert and oriented to person, place, and time. Mental status is at baseline.     Sensory: Sensation is intact.     Motor: Motor function is intact.     Coordination: Coordination is intact.     Gait: Gait is intact. Gait normal.  Psychiatric:         Attention and Perception: Attention and perception normal.        Mood and Affect: Mood and affect normal.  Speech: Speech normal.        Behavior: Behavior normal. Behavior is cooperative.        Thought Content: Thought content normal.        Cognition and Memory: Cognition and memory normal.        Judgment: Judgment normal.     Assessment  Plan  Sprain of left foot, initial encounter  Left foot pain - Plan: DG Foot Complete Left  Swelling of left foot Xray mild swelling no fracture from today  Call podiatry for appt to f/u Dr. Vickki Muff  Tylenol  Lidocaine with aspercream  Voltaren gel  Rest  Ice  Elevation  Air cam Boot 2-3 weeks left foot wear during the day  Handicap sticker x 6 months   Provider: Dr. Olivia Mackie McLean-Scocuzza-Internal Medicine

## 2022-03-31 NOTE — Patient Instructions (Addendum)
[facility]   [place], [place] [address]   [phone]   Cherrie Gauze, [place]   [place], [place] [address]   [phone] (Work)   [phone] (Fax  Call podiatry for appt to f/u   Tylenol  Lidocaine with aspercream  Voltaren gel  Rest  Ice  Elevation  Boot 2-3 weeks   [facility] 4.8 (12)  Medical supply store [address] Closes soon ? 4?PM  [phone]  2. [place] Open ? Closes 5?PM  [phone] [facility] 4.4 (16)  Medical supply store [place], [place] Open ? Closes 5?PM  [phone] In-store shopping  3. Senior medical supply  [facility] 3.5 [phone])  Medical clinic [place] Open ? Closes 5?PM  [phone]  Rest  Ice  Elevation  Boot  F/u ortho  Tylenol   Foot Sprain  A foot sprain is an injury to one of the ligaments in the feet. Ligaments are strong tissues that connect bones to each other. The ligament can be stretched too much. In some cases, it may tear. A tear can be either partial or complete. The severity of the sprain depends on how much of the ligament was damaged or torn. What are the causes? This condition is usually caused by suddenly twisting or pivoting your foot. What increases the risk? You are more likely to develop this condition if: You play a sport, such as basketball or football. You exercise or play a sport without first warming up your muscles. You start a new workout or sport. You suddenly increase how long or hard you exercise or play a sport. You have injured your foot or ankle before. What are the signs or symptoms? Symptoms of this condition start soon after an injury and include: Pain, especially in the arch of your foot. Bruising. Swelling. Being unable to walk or use your foot to support body weight. How is this diagnosed? This condition is diagnosed  with a medical history and physical exam. You may also have imaging tests, such as: X-rays to check for broken bones (fractures). An MRI to see if the ligament is torn. How is this treated? Treatment for this condition depends on the severity of the sprain. Mild sprains and major sprains can be treated with: Rest, ice, pressure (compression), and elevation (RICE). Elevation means raising your injured foot. Keeping your foot in a fixed position (immobilization) for a period of time. This is done if your ligament is overstretched or partially torn. Your health care provider will apply a bandage, splint, or walking boot to keep your foot from moving until it heals. Using crutches or a scooter for a few weeks to avoid bearing weight on your foot while it is healing. Physical therapy exercises to improve movement and strength in your foot. Major sprains may also be treated with: Surgery. This is done if your ligament is fully torn and a procedure is needed to reconnect it to the bone. A cast or splint. This will be needed after surgery. A cast or splint will need to stay on your foot while it heals. Follow these instructions at home: If you have a bandage, splint, or boot: Wear it as told by your health care provider. Remove it only as told by your health care provider. Loosen it if your toes tingle, become numb, or turn cold  and blue. Keep it clean and dry. If you have a cast: Do not put pressure on any part of the cast until it is fully hardened. This may take several hours. Do not stick anything inside the cast to scratch your skin. Doing that increases your risk for infection. Check the skin around the cast every day. Tell your health care provider about any concerns. You may put lotion on dry skin around the edges of the cast. Do not put lotion on the skin underneath the cast. Keep it clean and dry. Bathing Do not take baths, swim, or use a hot tub until your health care provider approves.  Ask your health care provider if you may take showers. You may only be allowed to take sponge baths. If the bandage, splint, boot, or cast is not waterproof: Do not let it get wet. Cover it with a watertight covering when you take a bath or shower. Managing pain, stiffness, and swelling  If directed, put ice on the injured area. To do this: If you have a removable bandage, splint, or boot, remove it as told by your health care provider. Put ice in a plastic bag. Place a towel between your skin and the bag, or between your cast and the bag. Leave the ice on for 20 minutes, 2-3 times per day. Remove the ice if your skin turns bright red. This is very important. If you cannot feel pain, heat, or cold, you have a greater risk of damage to the area. Move your toes often to reduce stiffness and swelling. Elevate the injured area above the level of your heart while you are sitting or lying down. Activity Do not use the injured foot to support your body weight until your health care provider says that you can. Use crutches or a scooter as told by your health care provider. Ask your health care provider what activities are safe for you. Do exercises as told by your health care provider. Gradually increase how much and how far you walk until your health care provider says it is safe to return to full activity. Driving Ask your health care provider if the medicine prescribed to you requires you to avoid driving or using machinery. Ask your health care provider when it is safe to drive if you have a bandage, splint, boot, or cast on your foot. General instructions Take over-the-counter and prescription medicines only as told by your health care provider. When you can walk without pain, wear supportive shoes that have stiff soles. Do not wear flip-flops. Do not walk barefoot. Keep all follow-up visits. This is important. Contact a health care provider if: Medicine does not help your pain. Your bruising  or swelling gets worse or does not get better with treatment. Your splint, boot, or cast is damaged. Get help right away if: You develop severe numbness or tingling in your foot. Your foot turns blue, white, or gray, and it feels cold. Summary A foot sprain is an injury to one of the ligaments in the feet. Ligaments are strong tissues that connect bones to each other. You may need a bandage, splint, boot, or cast to support your foot while it heals. Sometimes, surgery may be needed. You may need physical therapy exercises to improve movement and strength in your foot. This information is not intended to replace advice given to you by your health care provider. Make sure you discuss any questions you have with your health care provider. Document Revised: 12/12/2019 Document Reviewed: 12/12/2019  Elsevier Patient Education  2023 [employer]vier Inc.  

## 2022-04-04 ENCOUNTER — Telehealth: Payer: Self-pay | Admitting: Family Medicine

## 2022-04-04 NOTE — Telephone Encounter (Signed)
Patient called to cancel health nurse visit and wants to reschedule. Call back number [phone].

## 2022-04-07 DIAGNOSIS — H25813 Combined forms of age-related cataract, bilateral: Secondary | ICD-10-CM | POA: Diagnosis not present

## 2022-04-07 DIAGNOSIS — M3501 Sicca syndrome with keratoconjunctivitis: Secondary | ICD-10-CM | POA: Diagnosis not present

## 2022-04-07 DIAGNOSIS — Z961 Presence of intraocular lens: Secondary | ICD-10-CM | POA: Diagnosis not present

## 2022-04-28 DIAGNOSIS — I10 Essential (primary) hypertension: Secondary | ICD-10-CM | POA: Diagnosis not present

## 2022-04-28 DIAGNOSIS — I483 Typical atrial flutter: Secondary | ICD-10-CM | POA: Diagnosis not present

## 2022-04-28 DIAGNOSIS — I480 Paroxysmal atrial fibrillation: Secondary | ICD-10-CM | POA: Diagnosis not present

## 2022-04-28 DIAGNOSIS — I4819 Other persistent atrial fibrillation: Secondary | ICD-10-CM | POA: Diagnosis not present

## 2022-04-28 DIAGNOSIS — Z8679 Personal history of other diseases of the circulatory system: Secondary | ICD-10-CM | POA: Diagnosis not present

## 2022-05-10 ENCOUNTER — Other Ambulatory Visit: Payer: Self-pay | Admitting: Family Medicine

## 2022-05-19 ENCOUNTER — Ambulatory Visit: Payer: Medicare PPO

## 2022-06-01 ENCOUNTER — Ambulatory Visit: Payer: Medicare PPO | Admitting: Family Medicine

## 2022-06-13 DIAGNOSIS — L570 Actinic keratosis: Secondary | ICD-10-CM | POA: Diagnosis not present

## 2022-06-13 DIAGNOSIS — R21 Rash and other nonspecific skin eruption: Secondary | ICD-10-CM | POA: Diagnosis not present

## 2022-06-13 DIAGNOSIS — L578 Other skin changes due to chronic exposure to nonionizing radiation: Secondary | ICD-10-CM | POA: Diagnosis not present

## 2022-06-13 DIAGNOSIS — L814 Other melanin hyperpigmentation: Secondary | ICD-10-CM | POA: Diagnosis not present

## 2022-06-26 DIAGNOSIS — H903 Sensorineural hearing loss, bilateral: Secondary | ICD-10-CM | POA: Diagnosis not present

## 2022-07-31 DIAGNOSIS — H8102 Meniere's disease, left ear: Secondary | ICD-10-CM | POA: Diagnosis not present

## 2022-07-31 DIAGNOSIS — H903 Sensorineural hearing loss, bilateral: Secondary | ICD-10-CM | POA: Diagnosis not present

## 2022-08-04 ENCOUNTER — Other Ambulatory Visit: Payer: Self-pay | Admitting: Family Medicine

## 2022-08-04 NOTE — Telephone Encounter (Signed)
Pt hasn't been seen in over a year, please schedule CPE or at least a med refill, and then route back to me

## 2022-08-04 NOTE — Telephone Encounter (Signed)
LVM for patient tcb and schedule 

## 2022-08-15 ENCOUNTER — Telehealth: Payer: Self-pay | Admitting: Family Medicine

## 2022-08-15 DIAGNOSIS — E7800 Pure hypercholesterolemia, unspecified: Secondary | ICD-10-CM

## 2022-08-15 DIAGNOSIS — N401 Enlarged prostate with lower urinary tract symptoms: Secondary | ICD-10-CM

## 2022-08-15 DIAGNOSIS — R7303 Prediabetes: Secondary | ICD-10-CM

## 2022-08-15 DIAGNOSIS — I10 Essential (primary) hypertension: Secondary | ICD-10-CM

## 2022-08-15 NOTE — Telephone Encounter (Signed)
-----   Message from Velna Hatchet, RT sent at 08/09/2022  2:55 PM EST ----- Regarding: Wed 12/13 lab Patient is scheduled for cpx, please order future labs.  Thanks, Anda Kraft

## 2022-08-16 ENCOUNTER — Other Ambulatory Visit (INDEPENDENT_AMBULATORY_CARE_PROVIDER_SITE_OTHER): Payer: Medicare PPO

## 2022-08-16 DIAGNOSIS — R7303 Prediabetes: Secondary | ICD-10-CM

## 2022-08-16 DIAGNOSIS — Z125 Encounter for screening for malignant neoplasm of prostate: Secondary | ICD-10-CM

## 2022-08-16 DIAGNOSIS — E7800 Pure hypercholesterolemia, unspecified: Secondary | ICD-10-CM | POA: Diagnosis not present

## 2022-08-16 DIAGNOSIS — N401 Enlarged prostate with lower urinary tract symptoms: Secondary | ICD-10-CM

## 2022-08-16 DIAGNOSIS — I10 Essential (primary) hypertension: Secondary | ICD-10-CM | POA: Diagnosis not present

## 2022-08-16 DIAGNOSIS — R350 Frequency of micturition: Secondary | ICD-10-CM | POA: Diagnosis not present

## 2022-08-16 LAB — COMPREHENSIVE METABOLIC PANEL
ALT: 29 U/L (ref 0–53)
AST: 27 U/L (ref 0–37)
Albumin: 4.6 g/dL (ref 3.5–5.2)
Alkaline Phosphatase: 49 U/L (ref 39–117)
BUN: 12 mg/dL (ref 6–23)
CO2: 31 mEq/L (ref 19–32)
Calcium: 9.2 mg/dL (ref 8.4–10.5)
Chloride: 98 mEq/L (ref 96–112)
Creatinine, Ser: 0.79 mg/dL (ref 0.40–1.50)
GFR: 87.59 mL/min (ref >60.00)
Glucose, Bld: 96 mg/dL (ref 70–99)
Potassium: 4.1 mEq/L (ref 3.5–5.1)
Sodium: 137 mEq/L (ref 135–145)
Total Bilirubin: 0.8 mg/dL (ref 0.2–1.2)
Total Protein: 6.8 g/dL (ref 6.0–8.3)

## 2022-08-16 LAB — CBC WITH DIFFERENTIAL/PLATELET
Basophils Absolute: 0 10*3/uL (ref 0.0–0.1)
Basophils Relative: 0.8 % (ref 0.0–3.0)
Eosinophils Absolute: 0.2 10*3/uL (ref 0.0–0.7)
Eosinophils Relative: 3.5 % (ref 0.0–5.0)
HCT: 46.5 % (ref 39.0–52.0)
Hemoglobin: 15.9 g/dL (ref 13.0–17.0)
Lymphocytes Relative: 26.5 % (ref 12.0–46.0)
Lymphs Abs: 1.5 10*3/uL (ref 0.7–4.0)
MCHC: 34.2 g/dL (ref 30.0–36.0)
MCV: 91.9 fl (ref 78.0–100.0)
Monocytes Absolute: 0.6 10*3/uL (ref 0.1–1.0)
Monocytes Relative: 11.3 % (ref 3.0–12.0)
Neutro Abs: 3.3 10*3/uL (ref 1.4–7.7)
Neutrophils Relative %: 57.9 % (ref 43.0–77.0)
Platelets: 348 10*3/uL (ref 150.0–400.0)
RBC: 5.06 Mil/uL (ref 4.22–5.81)
RDW: 12.9 % (ref 11.5–15.5)
WBC: 5.7 10*3/uL (ref 4.0–10.5)

## 2022-08-16 LAB — LIPID PANEL
Cholesterol: 181 mg/dL (ref 0–200)
HDL: 78.6 mg/dL (ref >39.00)
LDL Cholesterol: 85 mg/dL (ref 0–99)
NonHDL: 102.3
Total CHOL/HDL Ratio: 2
Triglycerides: 89 mg/dL (ref 0.0–149.0)
VLDL: 17.8 mg/dL (ref 0.0–40.0)

## 2022-08-16 LAB — TSH: TSH: 1.04 u[IU]/mL (ref 0.35–5.50)

## 2022-08-16 LAB — PSA, MEDICARE: PSA: 3.39 ng/ml (ref 0.10–4.00)

## 2022-08-16 LAB — HEMOGLOBIN A1C: Hgb A1c MFr Bld: 5.6 % (ref 4.6–6.5)

## 2022-08-22 DIAGNOSIS — H8102 Meniere's disease, left ear: Secondary | ICD-10-CM | POA: Diagnosis not present

## 2022-08-22 DIAGNOSIS — H903 Sensorineural hearing loss, bilateral: Secondary | ICD-10-CM | POA: Diagnosis not present

## 2022-08-23 ENCOUNTER — Ambulatory Visit (INDEPENDENT_AMBULATORY_CARE_PROVIDER_SITE_OTHER): Payer: Medicare PPO | Admitting: Family Medicine

## 2022-08-23 ENCOUNTER — Encounter: Payer: Self-pay | Admitting: Family Medicine

## 2022-08-23 VITALS — BP 128/80 | HR 56 | Temp 97.6°F | Ht 69.75 in | Wt 185.0 lb

## 2022-08-23 DIAGNOSIS — I719 Aortic aneurysm of unspecified site, without rupture: Secondary | ICD-10-CM | POA: Diagnosis not present

## 2022-08-23 DIAGNOSIS — R350 Frequency of micturition: Secondary | ICD-10-CM

## 2022-08-23 DIAGNOSIS — H8109 Meniere's disease, unspecified ear: Secondary | ICD-10-CM

## 2022-08-23 DIAGNOSIS — R7303 Prediabetes: Secondary | ICD-10-CM

## 2022-08-23 DIAGNOSIS — I4892 Unspecified atrial flutter: Secondary | ICD-10-CM | POA: Diagnosis not present

## 2022-08-23 DIAGNOSIS — E7800 Pure hypercholesterolemia, unspecified: Secondary | ICD-10-CM

## 2022-08-23 DIAGNOSIS — Z0000 Encounter for general adult medical examination without abnormal findings: Secondary | ICD-10-CM

## 2022-08-23 DIAGNOSIS — N401 Enlarged prostate with lower urinary tract symptoms: Secondary | ICD-10-CM | POA: Diagnosis not present

## 2022-08-23 DIAGNOSIS — Z125 Encounter for screening for malignant neoplasm of prostate: Secondary | ICD-10-CM | POA: Diagnosis not present

## 2022-08-23 DIAGNOSIS — I10 Essential (primary) hypertension: Secondary | ICD-10-CM

## 2022-08-23 MED ORDER — PRAVASTATIN SODIUM 80 MG PO TABS: 80.0000 mg | ORAL_TABLET | Freq: Every day | ORAL | 3 refills | Status: DC

## 2022-08-23 NOTE — Assessment & Plan Note (Signed)
Lab Results  Component Value Date   HGBA1C 5.6 08/16/2022   This is stable Good habits disc imp of low glycemic diet and wt loss to prevent DM2

## 2022-08-23 NOTE — Assessment & Plan Note (Signed)
Lab Results  Component Value Date   PSA 3.39 08/16/2022   PSA 2.63 02/08/2021   PSA 2.52 12/17/2019    This is up more this year Has BPH with stable symptoms  Urged f/u with urology-he has this visit planned in march

## 2022-08-23 NOTE — Progress Notes (Signed)
Subjective:    Patient ID: Marvin Pacheco, male    DOB: [birth year], 76 y.o.   MRN: [id number]  HPI Here for health maintenance exam and to review chronic medical problems    Wt Readings from Last 3 Encounters:  08/23/22 185 lb (83.9 kg)  03/31/22 181 lb 6.4 oz (82.3 kg)  01/05/22 183 lb (83 kg)   26.74 kg/m  Staying busy  Feels ok overall   Dealing with meniere's  Sees ENT-had f/u yesterday  R ear is the good ear- had more problems with it recently and was px steroids   Takes beta histine -old med for ear fluid    Immunization History  Administered Date(s) Administered   Fluad Quad(high Dose 65+) 06/10/2019, 06/02/2020   Influenza Split 07/20/2011, 05/22/2012   Influenza Whole 06/05/2007, 09/04/2009, 08/24/2010   Influenza, High Dose Seasonal PF 06/03/2017, 08/29/2018   Influenza,inj,Quad PF,6+ Mos 05/27/2013, 06/02/2016   Influenza-Unspecified 06/08/2014, 06/09/2015   Moderna Sars-Covid-2 Vaccination 10/02/2019, 10/30/2019   Pneumococcal Conjugate-13 06/08/2014   Pneumococcal Polysaccharide-23 05/27/2013   Td 09/04/2000   Tdap 11/02/2010   Zoster, Live 12/02/2010   Health Maintenance Due  Topic Date Due   Zoster Vaccines- Shingrix (1 of 2) Never done   COVID-19 Vaccine (3 - Moderna risk series) 11/27/2019   DTaP/Tdap/Td (3 - Td or Tdap) 11/01/2020   Medicare Annual Wellness (AWV)  02/17/2022   INFLUENZA VACCINE  04/04/2022   Shingrix:  unsure if he is interested   Flu vaccine : got this in oct   Tdap 10/1010 - ? Covered   Colonoscopy 01/2022 with 5 y recall   Prostate health  Has BPH He does have to urinate 1-2 times at night (helps if he does not drink water as late) More urgency  Lab Results  Component Value Date   PSA 3.39 08/16/2022   PSA 2.63 02/08/2021   PSA 2.52 12/17/2019   Urology folow up- goes every spring   HTN with h/o aortic aneurysm watched by cardiology  Takes eliquis -still on this  Also h/o a flutter (none recent)  Had a 2nd  ablation    bp is stable today  No cp or palpitations or headaches or edema  No side effects to medicines  BP Readings from Last 3 Encounters:  08/23/22 128/80  03/31/22 124/80  01/05/22 117/66     Hctz 25 mg daily  Calan xr 120 mg daily (used to be bid)   Pulse Readings from Last 3 Encounters:  08/23/22 (!) 56  03/31/22 86  01/05/22 70     Hyperlipidemia Lab Results  Component Value Date   CHOL 181 08/16/2022   CHOL 172 02/08/2021   CHOL 182 12/17/2019   Lab Results  Component Value Date   HDL 78.60 08/16/2022   HDL 76.70 02/08/2021   HDL 64.90 12/17/2019   Lab Results  Component Value Date   LDLCALC 85 08/16/2022   LDLCALC 81 02/08/2021   LDLCALC 98 12/17/2019   Lab Results  Component Value Date   TRIG 89.0 08/16/2022   TRIG 74.0 02/08/2021   TRIG 95.0 12/17/2019   Lab Results  Component Value Date   CHOLHDL 2 08/16/2022   CHOLHDL 2 02/08/2021   CHOLHDL 3 12/17/2019   Lab Results  Component Value Date   LDLDIRECT 129.8 05/22/2013   LDLDIRECT 106.2 04/08/2012   LDLDIRECT 142.7 10/19/2009   Pravastatin 80 mg daily  Has yearly check of AA and it has not changed    Prediabetes  Lab Results  Component Value Date   HGBA1C 5.6 08/16/2022  This is stable  Diet is fairly good  Lots of produce  Some oats and yogurt/greek and nuts  Some meat-mostly chicken/ low fat   Is mindful of sweets  Uses some honey    Lab Results  Component Value Date   CREATININE 0.79 08/16/2022   BUN 12 08/16/2022   NA 137 08/16/2022   K 4.1 08/16/2022   CL 98 08/16/2022   CO2 31 08/16/2022   Lab Results  Component Value Date   ALT 29 08/16/2022   AST 27 08/16/2022   ALKPHOS 49 08/16/2022   BILITOT 0.8 08/16/2022   Lab Results  Component Value Date   WBC 5.7 08/16/2022   HGB 15.9 08/16/2022   HCT 46.5 08/16/2022   MCV 91.9 08/16/2022   PLT 348.0 08/16/2022   Lab Results  Component Value Date   TSH 1.04 08/16/2022    Patient Active Problem List    Diagnosis Date Noted   Essential hypertension 02/17/2021   Medicare annual wellness visit, subsequent 02/17/2021   Tachycardia 12/22/2019   Welcome to Medicare preventive visit 10/21/2018   BPH (benign prostatic hyperplasia) 10/21/2018   Snoring 08/21/2016   Prediabetes 07/08/2015   Aortic aneurysm ([place]) 07/14/2014   Personal history of colonic adenoma 09/18/2012   Colon cancer screening 05/22/2012   Meniere disease 01/15/2012   ANA positive 01/15/2012   Adverse effects of medication 01/15/2012   Vertigo 12/15/2011   Atrial flutter ([place]) 12/15/2011   Routine general medical examination at a health care facility 10/03/2011   Prostate cancer screening 10/03/2011   ALLERGIC RHINITIS 04/08/2008   HYPERCHOLESTEROLEMIA 01/09/2008   Past Medical History:  Diagnosis Date   Aortic aneurysm ([place])    4 cm- watched by cardiology per pt   Atrial flutter ([place])    Basal cell carcinoma    on her face   Cataract    Hyperlipidemia    Meniere disease    Pseudophakia    Seasonal allergies    Sleep apnea    Past Surgical History:  Procedure Laterality Date   ABLATION  06/2019   Atrial fib flutter. REPEAT 2012   bilateral inguinal hernia  1990   CATARACT EXTRACTION     COLONOSCOPY     lipoma abdomen  2000   revision hernia     2000 and 2006. Revision of inguinal hernia   TOOTH EXTRACTION     Social History   Tobacco Use   Smoking status: Never   Smokeless tobacco: Never  Vaping Use   Vaping Use: Never used  Substance Use Topics   Alcohol use: No    Alcohol/week: 0.0 standard drinks of alcohol   Drug use: No   Family History  Problem Relation Age of Onset   Abnormal newborn screen Sister    Colon cancer Neg Hx    Stomach cancer Neg Hx    Allergies  Allergen Reactions   Pepcid Ac [Famotidine]     Abdominal cramping   Current Outpatient Medications on File Prior to Visit  Medication Sig Dispense Refill   apixaban (ELIQUIS) 5 MG TABS tablet Take 5 mg by mouth 2 (two)  times daily.     Ascorbic Acid (VITAMIN C) 1000 MG tablet Take 1,000 mg by mouth daily.     calcium gluconate 650 MG tablet Take 650 mg by mouth daily.     cholecalciferol (VITAMIN D) 1000 UNITS tablet Take 4,000 Units by mouth daily.  co-enzyme Q-10 50 MG capsule Take 50 mg by mouth daily.     fish oil-omega-3 fatty acids 1000 MG capsule Take 1 g by mouth daily.     Multiple Vitamin (MULTIVITAMIN) capsule Take 1 capsule by mouth daily.     NONFORMULARY OR COMPOUNDED ITEM Take 1 capsule by mouth in the morning and at bedtime. BETAHISTINE     Potassium Chloride Crys CR (KLOR-CON M20 PO) Take 1 tablet by mouth daily.     verapamil (CALAN-SR) 120 MG CR tablet Take 120 mg by mouth daily.     hydrochlorothiazide (HYDRODIURIL) 25 MG tablet Take by mouth.     No current facility-administered medications on file prior to visit.    Review of Systems  Constitutional:  Negative for activity change, appetite change, fatigue, fever and unexpected weight change.  HENT:  Positive for hearing loss. Negative for congestion, ear discharge, ear pain, rhinorrhea, sore throat and trouble swallowing.   Eyes:  Negative for pain, redness, itching and visual disturbance.  Respiratory:  Negative for cough, chest tightness, shortness of breath and wheezing.   Cardiovascular:  Negative for chest pain and palpitations.  Gastrointestinal:  Negative for abdominal pain, blood in stool, constipation, diarrhea and nausea.  Endocrine: Negative for cold intolerance, heat intolerance, polydipsia and polyuria.  Genitourinary:  Negative for difficulty urinating, dysuria, frequency and urgency.  Musculoskeletal:  Negative for arthralgias, joint swelling and myalgias.  Skin:  Negative for pallor and rash.  Neurological:  Negative for dizziness, tremors, weakness, numbness and headaches.  Hematological:  Negative for adenopathy. Does not bruise/bleed easily.  Psychiatric/Behavioral:  Negative for decreased concentration and  dysphoric mood. The patient is not nervous/anxious.        Objective:   Physical Exam Constitutional:      General: He is not in acute distress.    Appearance: Normal appearance. He is well-developed and normal weight. He is not ill-appearing or diaphoretic.  HENT:     Head: Normocephalic and atraumatic.     Right Ear: Tympanic membrane, ear canal and external ear normal.     Left Ear: Tympanic membrane, ear canal and external ear normal.     Ears:     Comments: Hearing aide in L ear     Nose: Nose normal. No congestion.     Mouth/Throat:     Mouth: Mucous membranes are moist.     Pharynx: Oropharynx is clear. No posterior oropharyngeal erythema.  Eyes:     General: No scleral icterus.       Right eye: No discharge.        Left eye: No discharge.     Conjunctiva/sclera: Conjunctivae normal.     Pupils: Pupils are equal, round, and reactive to light.  Neck:     Thyroid: No thyromegaly.     Vascular: No carotid bruit or JVD.  Cardiovascular:     Rate and Rhythm: Normal rate and regular rhythm.     Pulses: Normal pulses.     Heart sounds: Normal heart sounds.     No gallop.  Pulmonary:     Effort: Pulmonary effort is normal. No respiratory distress.     Breath sounds: Normal breath sounds. No wheezing or rales.     Comments: Good air exch Chest:     Chest wall: No tenderness.  Abdominal:     General: Bowel sounds are normal. There is no distension or abdominal bruit.     Palpations: Abdomen is soft. There is no mass.  Tenderness: There is no abdominal tenderness.     Hernia: No hernia is present.  Musculoskeletal:        General: No tenderness.     Cervical back: Normal range of motion and neck supple. No rigidity. No muscular tenderness.     Right lower leg: No edema.     Left lower leg: No edema.  Lymphadenopathy:     Cervical: No cervical adenopathy.  Skin:    General: Skin is warm and dry.     Coloration: Skin is not pale.     Findings: No erythema or rash.      Comments: Solar aging Scattered SK Solar lentigines diffusely   Neurological:     Mental Status: He is alert.     Cranial Nerves: No cranial nerve deficit.     Motor: No abnormal muscle tone.     Coordination: Coordination normal.     Gait: Gait normal.     Deep Tendon Reflexes: Reflexes are normal and symmetric. Reflexes normal.  Psychiatric:        Attention and Perception: Attention normal.        Mood and Affect: Mood normal.        Cognition and Memory: Cognition and memory normal.     Comments: Mentally sharp Pleasant            Assessment & Plan:   Problem List Items Addressed This Visit       Cardiovascular and Mediastinum   Aortic aneurysm ([place])    Pt continues cardiology care / obs  No symptoms  Stable Continues pravastatin for cholesterol with good control      Relevant Medications   pravastatin (PRAVACHOL) 80 MG tablet   Atrial flutter ([place])    Per pt no problems since his 2nd ablation  Continues verapamil and eliquis       Relevant Medications   pravastatin (PRAVACHOL) 80 MG tablet   Essential hypertension    bp in fair control at this time  BP Readings from Last 1 Encounters:  08/23/22 128/80  No changes needed Most recent labs reviewed  Disc lifstyle change with low sodium diet and exercise  Plan to continue Hctz 25 mg daily  Calan xr 120 mg daily      Relevant Medications   pravastatin (PRAVACHOL) 80 MG tablet     Nervous and Auditory   Meniere disease    Continues ENT f/u  Hearing aid in L ear - he broke this and plans f/u  May need R hearing augmentation in the future as well        Genitourinary   BPH (benign prostatic hyperplasia)     Other   HYPERCHOLESTEROLEMIA    Disc goals for lipids and reasons to control them In setting of known AAA  Rev last labs with pt Rev low sat fat diet in detail Good ratio of 2 , and LDL of 85 Plans to continue pravastatin 80 mg which he tolerates well  Continues cardiology care        Relevant Medications   pravastatin (PRAVACHOL) 80 MG tablet   Prediabetes    Lab Results  Component Value Date   HGBA1C 5.6 08/16/2022  This is stable Good habits disc imp of low glycemic diet and wt loss to prevent DM2       Prostate cancer screening    Lab Results  Component Value Date   PSA 3.39 08/16/2022   PSA 2.63 02/08/2021   PSA 2.52 12/17/2019  This is up more this year Has BPH with stable symptoms  Urged f/u with urology-he has this visit planned in march      Routine general medical examination at a health care facility - Primary    Reviewed health habits including diet and exercise and skin cancer prevention Reviewed appropriate screening tests for age  Also reviewed health mt list, fam hx and immunization status , as well as social and family history   See HPI Labs reviewed Considering shingrix later but declines for now  Had his flu shot  Due for tetanus -he will check cost at pharmacy or come back here if he gets a wound Colonoscopy is utd from 01/2022 with a 5 y recall Reviewed psa, will have urology f/u in march

## 2022-08-23 NOTE — Assessment & Plan Note (Signed)
Per pt no problems since his 2nd ablation  Continues verapamil and eliquis

## 2022-08-23 NOTE — Assessment & Plan Note (Signed)
Reviewed health habits including diet and exercise and skin cancer prevention Reviewed appropriate screening tests for age  Also reviewed health mt list, fam hx and immunization status , as well as social and family history   See HPI Labs reviewed Considering shingrix later but declines for now  Had his flu shot  Due for tetanus -he will check cost at pharmacy or come back here if he gets a wound Colonoscopy is utd from 01/2022 with a 5 y recall Reviewed psa, will have urology f/u in march

## 2022-08-23 NOTE — Assessment & Plan Note (Signed)
bp in fair control at this time  BP Readings from Last 1 Encounters:  08/23/22 128/80   No changes needed Most recent labs reviewed  Disc lifstyle change with low sodium diet and exercise  Plan to continue Hctz 25 mg daily  Calan xr 120 mg daily

## 2022-08-23 NOTE — Patient Instructions (Addendum)
If you are interested in the new shingles vaccine (Shingrix) - call your local pharmacy to check on coverage and availability  If affordable, get on a wait list at your pharmacy to get the vaccine.  It you want a tetanus shot-check with the pharmacy and see if covered If not- we can update you if you get a wound    Take care of yourself  Continue your current medicines  Try to get most of your carbohydrates from produce (with the exception of white potatoes)  Eat less bread/pasta/rice/snack foods/cereals/sweets and other items from the middle of the grocery store (processed carbs)

## 2022-08-23 NOTE — Assessment & Plan Note (Signed)
Continues ENT f/u  Hearing aid in L ear - he broke this and plans f/u  May need R hearing augmentation in the future as well

## 2022-08-23 NOTE — Assessment & Plan Note (Signed)
Disc goals for lipids and reasons to control them In setting of known AAA  Rev last labs with pt Rev low sat fat diet in detail Good ratio of 2 , and LDL of 85 Plans to continue pravastatin 80 mg which he tolerates well  Continues cardiology care

## 2022-08-23 NOTE — Assessment & Plan Note (Signed)
Pt continues cardiology care / obs  No symptoms  Stable Continues pravastatin for cholesterol with good control

## 2022-09-01 ENCOUNTER — Ambulatory Visit (INDEPENDENT_AMBULATORY_CARE_PROVIDER_SITE_OTHER): Payer: Medicare PPO

## 2022-09-01 VITALS — Ht 69.75 in | Wt 185.0 lb

## 2022-09-01 DIAGNOSIS — Z0000 Encounter for general adult medical examination without abnormal findings: Secondary | ICD-10-CM | POA: Diagnosis not present

## 2022-09-01 NOTE — Patient Instructions (Addendum)
Marvin Pacheco , Thank you for taking time to come for your Medicare Wellness Visit. I appreciate your ongoing commitment to your health goals. Please review the following plan we discussed and let me know if I can assist you in the future.   These are the goals we discussed:  Goals Addressed             This Visit's Progress    Increase physical activity           This is a list of the screening recommended for you and due dates:  Health Maintenance  Topic Date Due   DTaP/Tdap/Td vaccine (3 - Td or Tdap) 11/01/2020   COVID-19 Vaccine (3 - Moderna risk series) 09/17/2022*   Zoster (Shingles) Vaccine (1 of 2) 11/22/2022*   Medicare Annual Wellness Visit  09/02/2023   Colon Cancer Screening  01/06/2027   Pneumonia Vaccine  Completed   Flu Shot  Completed   Hepatitis C Screening: USPSTF Recommendation to screen - Ages 18-79 yo.  Completed   HPV Vaccine  Aged Out  *Topic was postponed. The date shown is not the original due date.    Advanced directives: End of life planning; Advance aging; Advanced directives discussed.  Copy of current HCPOA/Living Will requested.    Conditions/risks identified: none new  Next appointment: Follow up in one year for your annual wellness visit.   Preventive Care 55 Years and Older, Male  Preventive care refers to lifestyle choices and visits with your health care provider that can promote health and wellness. What does preventive care include? A yearly physical exam. This is also called an annual well check. Dental exams once or twice a year. Routine eye exams. Ask your health care provider how often you should have your eyes checked. Personal lifestyle choices, including: Daily care of your teeth and gums. Regular physical activity. Eating a healthy diet. Avoiding tobacco and drug use. Limiting alcohol use. Practicing safe sex. Taking low doses of aspirin every day. Taking vitamin and mineral supplements as recommended by your health care  provider. What happens during an annual well check? The services and screenings done by your health care provider during your annual well check will depend on your age, overall health, lifestyle risk factors, and family history of disease. Counseling  Your health care provider may ask you questions about your: Alcohol use. Tobacco use. Drug use. Emotional well-being. Home and relationship well-being. Sexual activity. Eating habits. History of falls. Memory and ability to understand (cognition). Work and work Statistician. Screening  You may have the following tests or measurements: Height, weight, and BMI. Blood pressure. Lipid and cholesterol levels. These may be checked every 5 years, or more frequently if you are over 10 years old. Skin check. Lung cancer screening. You may have this screening every year starting at age 51 if you have a 30-pack-year history of smoking and currently smoke or have quit within the past 15 years. Fecal occult blood test (FOBT) of the stool. You may have this test every year starting at age 32. Flexible sigmoidoscopy or colonoscopy. You may have a sigmoidoscopy every 5 years or a colonoscopy every 10 years starting at age 48. Prostate cancer screening. Recommendations will vary depending on your family history and other risks. Hepatitis C blood test. Hepatitis B blood test. Sexually transmitted disease (STD) testing. Diabetes screening. This is done by checking your blood sugar (glucose) after you have not eaten for a while (fasting). You may have this done every 1-3  years. Abdominal aortic aneurysm (AAA) screening. You may need this if you are a current or former smoker. Osteoporosis. You may be screened starting at age 57 if you are at high risk. Talk with your health care provider about your test results, treatment options, and if necessary, the need for more tests. Vaccines  Your health care provider may recommend certain vaccines, such  as: Influenza vaccine. This is recommended every year. Tetanus, diphtheria, and acellular pertussis (Tdap, Td) vaccine. You may need a Td booster every 10 years. Zoster vaccine. You may need this after age 52. Pneumococcal 13-valent conjugate (PCV13) vaccine. One dose is recommended after age 81. Pneumococcal polysaccharide (PPSV23) vaccine. One dose is recommended after age 77. Talk to your health care provider about which screenings and vaccines you need and how often you need them. This information is not intended to replace advice given to you by your health care provider. Make sure you discuss any questions you have with your health care provider. Document Released: 09/17/2015 Document Revised: 05/10/2016 Document Reviewed: 06/22/2015 Elsevier Interactive Patient Education  2017 [place] Prevention in the Home Falls can cause injuries. They can happen to people of all ages. There are many things you can do to make your home safe and to help prevent falls. What can I do on the outside of my home? Regularly fix the edges of walkways and driveways and fix any cracks. Remove anything that might make you trip as you walk through a door, such as a raised step or threshold. Trim any bushes or trees on the path to your home. Use bright outdoor lighting. Clear any walking paths of anything that might make someone trip, such as rocks or tools. Regularly check to see if handrails are loose or broken. Make sure that both sides of any steps have handrails. Any raised decks and porches should have guardrails on the edges. Have any leaves, snow, or ice cleared regularly. Use sand or salt on walking paths during winter. Clean up any spills in your garage right away. This includes oil or grease spills. What can I do in the bathroom? Use night lights. Install grab bars by the toilet and in the tub and shower. Do not use towel bars as grab bars. Use non-skid mats or decals in the tub or  shower. If you need to sit down in the shower, use a plastic, non-slip stool. Keep the floor dry. Clean up any water that spills on the floor as soon as it happens. Remove soap buildup in the tub or shower regularly. Attach bath mats securely with double-sided non-slip rug tape. Do not have throw rugs and other things on the floor that can make you trip. What can I do in the bedroom? Use night lights. Make sure that you have a light by your bed that is easy to reach. Do not use any sheets or blankets that are too big for your bed. They should not hang down onto the floor. Have a firm chair that has side arms. You can use this for support while you get dressed. Do not have throw rugs and other things on the floor that can make you trip. What can I do in the kitchen? Clean up any spills right away. Avoid walking on wet floors. Keep items that you use a lot in easy-to-reach places. If you need to reach something above you, use a strong step stool that has a grab bar. Keep electrical cords out of the way. Do  not use floor polish or wax that makes floors slippery. If you must use wax, use non-skid floor wax. Do not have throw rugs and other things on the floor that can make you trip. What can I do with my stairs? Do not leave any items on the stairs. Make sure that there are handrails on both sides of the stairs and use them. Fix handrails that are broken or loose. Make sure that handrails are as long as the stairways. Check any carpeting to make sure that it is firmly attached to the stairs. Fix any carpet that is loose or worn. Avoid having throw rugs at the top or bottom of the stairs. If you do have throw rugs, attach them to the floor with carpet tape. Make sure that you have a light switch at the top of the stairs and the bottom of the stairs. If you do not have them, ask someone to add them for you. What else can I do to help prevent falls? Wear shoes that: Do not have high heels. Have  rubber bottoms. Are comfortable and fit you well. Are closed at the toe. Do not wear sandals. If you use a stepladder: Make sure that it is fully opened. Do not climb a closed stepladder. Make sure that both sides of the stepladder are locked into place. Ask someone to hold it for you, if possible. Clearly mark and make sure that you can see: Any grab bars or handrails. First and last steps. Where the edge of each step is. Use tools that help you move around (mobility aids) if they are needed. These include: Canes. Walkers. Scooters. Crutches. Turn on the lights when you go into a dark area. Replace any light bulbs as soon as they burn out. Set up your furniture so you have a clear path. Avoid moving your furniture around. If any of your floors are uneven, fix them. If there are any pets around you, be aware of where they are. Review your medicines with your doctor. Some medicines can make you feel dizzy. This can increase your chance of falling. Ask your doctor what other things that you can do to help prevent falls. This information is not intended to replace advice given to you by your health care provider. Make sure you discuss any questions you have with your health care provider. Document Released: 06/17/2009 Document Revised: 01/27/2016 Document Reviewed: 09/25/2014 Elsevier Interactive Patient Education  2017 [employer].

## 2022-09-01 NOTE — Progress Notes (Signed)
Subjective:   Marvin Pacheco is a 76 y.o. male who presents for Medicare Annual/Subsequent preventive examination.  Review of Systems    No ROS.  Medicare Wellness Virtual Visit.  Visual/audio telehealth visit, UTA vital signs.   See social history for additional risk factors.   Cardiac Risk Factors include: advanced age (>58mn, >>71women)     Objective:    Today's Vitals   09/01/22 1335  Weight: 185 lb (83.9 kg)  Height: 5' 9.75" (1.772 m)   Body mass index is 26.74 kg/m.     09/01/2022    1:35 PM 12/15/2019   11:19 AM 10/14/2018    1:02 PM  Advanced Directives  Does Patient Have a Medical Advance Directive? Yes No No  Type of [place]aramed[place]of [place]    Does patient want to make changes to medical advance directive? No - Patient declined    Copy of HMelmorein Chart? No - copy requested    Would patient like information on creating a medical advance directive?  No - Patient declined Yes (MAU/Ambulatory/Procedural Areas - Information given)    Current Medications (verified) Outpatient Encounter Medications as of 09/01/2022  Medication Sig   apixaban (ELIQUIS) 5 MG TABS tablet Take 5 mg by mouth 2 (two) times daily.   Potassium Chloride Crys CR (KLOR-CON M20 PO) Take 1 tablet by mouth daily.   pravastatin (PRAVACHOL) 80 MG tablet Take 1 tablet (80 mg total) by mouth daily.   verapamil (CALAN-SR) 120 MG CR tablet Take 120 mg by mouth daily.   Ascorbic Acid (VITAMIN C) 1000 MG tablet Take 1,000 mg by mouth daily.   calcium gluconate 650 MG tablet Take 650 mg by mouth daily.   cholecalciferol (VITAMIN D) 1000 UNITS tablet Take 4,000 Units by mouth daily.   co-enzyme Q-10 50 MG capsule Take 50 mg by mouth daily.   fish oil-omega-3 fatty acids 1000 MG capsule Take 1 g by mouth daily.   hydrochlorothiazide (HYDRODIURIL) 25 MG tablet Take by mouth.   Multiple Vitamin (MULTIVITAMIN) capsule Take 1 capsule by mouth daily.    NONFORMULARY OR COMPOUNDED ITEM Take 1 capsule by mouth daily. BETAHISTINE   No facility-administered encounter medications on file as of 09/01/2022.    Allergies (verified) Pepcid ac [famotidine]   History: Past Medical History:  Diagnosis Date   [place] ([place]    4 cm- watched by cardiology per pt   [place]    Basal cell carcinoma    on her face   Cataract    Hyperlipidemia    Meniere disease    Pseudophakia    Seasonal allergies    Sleep apnea    Past Surgical History:  Procedure Laterality Date   ABLATION  06/2019   Atrial fib flutter. REPEAT 2012   bilateral inguinal hernia  1990   CATARACT EXTRACTION     COLONOSCOPY     lipoma abdomen  2000   revision hernia     2000 and 2006. Revision of inguinal hernia   TOOTH EXTRACTION     Family History  Problem Relation Age of Onset   Abnormal newborn screen Sister    Colon cancer Neg Hx    Stomach cancer Neg Hx    Social History   Socioeconomic History   Marital status: Married    Spouse name: Not on file   Number of children: Not on file   Years of education: Not on file  Highest education level: Not on file  Occupational History   Not on file  Tobacco Use   Smoking status: Never   Smokeless tobacco: Never  Vaping Use   Vaping Use: Never used  Substance and Sexual Activity   Alcohol use: No    Alcohol/week: 0.0 standard drinks of alcohol   Drug use: No   Sexual activity: Not on file  Other Topics Concern   Not on file  Social History Narrative   Patient is married, 2 sons 1 daughter   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 [employer]   Former smoker no alcohol does drink some tea no drug use no tobacco at all now   Social Determinants of Radio broadcast assistant Strain: Low Risk  (09/01/2022)   Overall Financial Resource Strain (CARDIA)    Difficulty of Paying Living Expenses: Not hard at all  Food Insecurity: No Food Insecurity (09/01/2022)   Hunger Vital  Sign    Worried About Running Out of Food in the Last Year: Never true    [place] in the Last Year: Never true  Transportation Needs: No Transportation Needs (09/01/2022)   PRAPARE - Hydrologist (Medical): No    Lack of Transportation (Non-Medical): No  Physical Activity: Insufficiently Active (09/01/2022)   Exercise Vital Sign    Days of Exercise per Week: 7 days    Minutes of Exercise per Session: 20 min  Stress: No Stress Concern Present (09/01/2022)   Marvin    Feeling of Stress : Not at all  Social Connections: Unknown (09/01/2022)   Social Connection and Isolation Panel [NHANES]    Frequency of Communication with Friends and Family: Not on file    Frequency of Social Gatherings with Friends and Family: Not on file    Attends Religious Services: Not on file    Active Member of Clubs or Organizations: Not on file    Attends Archivist Meetings: Not on file    Marital Status: Married    Tobacco Counseling Counseling given: Not Answered   Clinical Intake:  Pre-visit preparation completed: Yes        Diabetes: No  How often do you need to have someone help you when you read instructions, pamphlets, or other written materials from your doctor or pharmacy?: 1 - Never    Interpreter Needed?: No      Activities of Daily Living    09/01/2022    1:47 PM  In your present state of health, do you have any difficulty performing the following activities:  Hearing? 1  Vision? 0  Difficulty concentrating or making decisions? 0  Walking or climbing stairs? 0  Dressing or bathing? 0  Doing errands, shopping? 0  Preparing Food and eating ? N  Using the Toilet? N  In the past six months, have you accidently leaked urine? N  Do you have problems with loss of bowel control? N  Managing your Medications? N  Managing your Finances? N  Housekeeping or  managing your Housekeeping? N    Patient Care Team: Tower, Wynelle Fanny, MD as PCP - General  Indicate any recent Medical Services you may have received from other than [facility] providers in the past year (date may be approximate).     Assessment:   This is a routine wellness examination for Marvin Pacheco.  I connected with  Marvin Pacheco on 09/01/22 by a  audio enabled telemedicine application and verified that I am speaking with the correct person using two identifiers.  Patient Location: Home  Provider Location: Office/Clinic  I discussed the limitations of evaluation and management by telemedicine. The patient expressed understanding and agreed to proceed.   Hearing/Vision screen Hearing Screening - Comments:: Followed by [facility], Dr. Candis Schatz  Some difficulty hearing; severe hearing loss L ear, hearing aid worn L ear only, considering R ear hearing aid.   Annual visits Vision Screening - Comments:: Followed by [facility], Dr. Rise Paganini Wears corrective lenses when reading Cataract extraction, bilateral They have seen their ophthalmologist in the last 12 months.    Dietary issues and exercise activities discussed: Current Exercise Habits: Home exercise routine, Type of exercise: walking, Time (Minutes): 20, Frequency (Times/Week): 7, Weekly Exercise (Minutes/Week): 140, Intensity: Mild Healthy diet; breakfast -greek yogurt, fruit, nuts, oats. Lunch-vegetables, salmon. Dinner- chicken/fish, vegetable.  Avoids sugar and salt. Good water intake   Goals Addressed             This Visit's Progress    Increase physical activity         Depression Screen    09/01/2022    1:47 PM 03/31/2022    2:58 PM 02/17/2021    9:47 AM 12/15/2019   11:22 AM 10/14/2018    1:01 PM 08/31/2017    9:13 AM 07/14/2014   11:31 AM  PHQ 2/9 Scores  PHQ - 2 Score 0 0 0 0 0 0 0  PHQ- 9 Score   0 0 0      Fall Risk    09/01/2022    1:47 PM 03/31/2022    2:58 PM 02/17/2021    9:46 AM  12/15/2019   11:20 AM 10/14/2018    1:01 PM  [place] in the past year? 0 0 0 0 0  Number falls in past yr: 0  0 0   Injury with Fall? 0  0 0   Risk for fall due to :    Medication side effect   Follow up Falls evaluation completed;Falls prevention discussed   Falls evaluation completed;Falls prevention discussed     FALL RISK PREVENTION PERTAINING TO THE HOME: Home free of loose throw rugs in walkways, pet beds, electrical cords, etc? Yes  Adequate lighting in your home to reduce risk of falls? Yes   ASSISTIVE DEVICES UTILIZED TO PREVENT FALLS: Life alert? No  Use of a cane, walker or w/c? No  Grab bars in the bathroom? Yes  Shower chair or bench in shower? No  Elevated toilet seat or a handicapped toilet? No   TIMED UP AND GO: Was the test performed? No .   Cognitive Function:    12/15/2019   11:25 AM 10/14/2018    1:02 PM  MMSE - Mini Mental State Exam  Orientation to time 5 5  Orientation to Place 5 5  Registration 3 3  Attention/ Calculation 5 0  Recall 3 3  Language- name 2 objects  0  Language- repeat 1 1  Language- follow 3 step command  3  Language- read & follow direction  0  Write a sentence  0  Copy design  0  Total score  20        09/01/2022    1:51 PM  6CIT Screen  What Year? 0 points  What month? 0 points  What time? 0 points  Count back from 20 0 points  Months in reverse 0  points  Repeat phrase 0 points  Total Score 0 points    Immunizations Immunization History  Administered Date(s) Administered   Fluad Quad(high Dose 65+) 06/10/2019, 06/02/2020   Influenza Split 07/20/2011, 05/22/2012   Influenza Whole 06/05/2007, 09/04/2009, 08/24/2010   Influenza, High Dose Seasonal PF 06/03/2017, 08/29/2018, 06/28/2022   Influenza,inj,Quad PF,6+ Mos 05/27/2013, 06/02/2016   Influenza-Unspecified 06/08/2014, 06/09/2015   Moderna Sars-Covid-2 Vaccination 10/02/2019, 10/30/2019   Pneumococcal Conjugate-13 06/08/2014   Pneumococcal  Polysaccharide-23 05/27/2013   Td 09/04/2000   Tdap 11/02/2010   Zoster, Live 12/02/2010   TDAP status: Due, Education has been provided regarding the importance of this vaccine. Advised may receive this vaccine at local pharmacy or Health Dept. Aware to provide a copy of the vaccination record if obtained from local pharmacy or Health Dept. Verbalized acceptance and understanding.  Screening Tests Health Maintenance  Topic Date Due   DTaP/Tdap/Td (3 - Td or Tdap) 11/01/2020   COVID-19 Vaccine (3 - Moderna risk series) 09/17/2022 (Originally 11/27/2019)   Zoster Vaccines- Shingrix (1 of 2) 11/22/2022 (Originally 03/27/1965)   Medicare Annual Wellness ([place])  09/02/2023   COLONOSCOPY (Pts 45-67yr Insurance coverage will need to be confirmed)  01/06/2027   Pneumonia Vaccine 76 Years old  Completed   INFLUENZA VACCINE  Completed   Hepatitis C Screening  Completed   HPV VACCINES  Aged Out    Health Maintenance Health Maintenance Due  Topic Date Due   DTaP/Tdap/Td (3 - Td or Tdap) 11/01/2020   Lung Cancer Screening: (Low Dose CT Chest recommended if Age 74-80years, 30 pack-year currently smoking OR have quit w/in 15years.) does not qualify.   Hepatitis C Screening: Completed 07/2015.  Vision Screening: Recommended annual ophthalmology exams for early detection of glaucoma and other disorders of the eye.  Dental Screening: Recommended annual dental exams for proper oral hygiene.  Community Resource Referral / Chronic Care Management: CRR required this visit?  No   CCM required this visit?  No      Plan:     I have personally reviewed and noted the following in the patient's chart:   Medical and social history Use of alcohol, tobacco or illicit drugs  Current medications and supplements including opioid prescriptions. Patient is not currently taking opioid prescriptions. Functional ability and status Nutritional status Physical activity Advanced directives List of other  physicians Hospitalizations, surgeries, and ER visits in previous 12 months Vitals Screenings to include cognitive, depression, and falls Referrals and appointments  In addition, I have reviewed and discussed with patient certain preventive protocols, quality metrics, and best practice recommendations. A written personalized care plan for preventive services as well as general preventive health recommendations were provided to patient.     DLeta Jungling LPN   [id number]

## 2022-09-06 ENCOUNTER — Telehealth: Payer: Self-pay | Admitting: Family Medicine

## 2022-09-06 NOTE — Telephone Encounter (Signed)
PT wants to know if the Moderna Covid Vax. Covers the most recent strand of covid.

## 2022-09-06 NOTE — Telephone Encounter (Signed)
Pt called requesting a call back wants to know if its ok to take the Covid and RSV  vaccine please advise [phone]

## 2022-09-06 NOTE — Telephone Encounter (Signed)
Return phone call to client regarding COVID-19 vaccine.  Reviewed that we currently have the 2023-2024 Formula of the COVID-19 vaccines.  Client with no further questions.  Estelene Carmack Shelda Pal, RN

## 2022-09-06 NOTE — Telephone Encounter (Signed)
I do recommend them.  I would separate them by about a month.

## 2022-09-06 NOTE — Telephone Encounter (Signed)
Would like to know more info on the [place] and if it covers the most recent strand.

## 2022-09-06 NOTE — Telephone Encounter (Signed)
Per DPR left VM letting pt know Dr. Tower's comments  

## 2022-09-08 ENCOUNTER — Ambulatory Visit: Payer: Self-pay

## 2022-10-09 ENCOUNTER — Encounter: Payer: Self-pay | Admitting: Family

## 2022-10-09 ENCOUNTER — Telehealth: Payer: Self-pay

## 2022-10-09 ENCOUNTER — Telehealth (INDEPENDENT_AMBULATORY_CARE_PROVIDER_SITE_OTHER): Payer: Medicare PPO | Admitting: Family

## 2022-10-09 DIAGNOSIS — U071 COVID-19: Secondary | ICD-10-CM | POA: Diagnosis not present

## 2022-10-09 DIAGNOSIS — R059 Cough, unspecified: Secondary | ICD-10-CM

## 2022-10-09 LAB — POC COVID19 BINAXNOW: SARS Coronavirus 2 Ag: POSITIVE — AB

## 2022-10-09 MED ORDER — MOLNUPIRAVIR EUA 200MG CAPSULE: 4.0000 | ORAL_CAPSULE | Freq: Two times a day (BID) | ORAL | 0 refills | Status: AC

## 2022-10-09 NOTE — Telephone Encounter (Signed)
LVM to call back to office to make appt virtual

## 2022-10-09 NOTE — Progress Notes (Signed)
Virtual Visit via Video Note  I connected with Marvin Pacheco on 10/09/22 at 12:00 PM EST by a video enabled telemedicine application and verified that I am speaking with the correct person using two identifiers. Location patient: home Location provider: work  Persons participating in the virtual visit: patient, provider  I discussed the limitations of evaluation and management by telemedicine and the availability of in person appointments. The patient expressed understanding and agreed to proceed.  HPI: Complains of congestion and cough which started 4 days ago.   He is covid positive. His wife is positive for covid as well  Endorses bodyaches, tactile warmth, cough.  He has h/o atrial flutter, HTN  Compliant with eliquis  Never smoker  GFR 87  No wheezing, sob  He is due for COVID booster  ROS: See pertinent positives and negatives per HPI.  EXAM:   VITALS per patient if applicable: There were no vitals taken for this visit. BP Readings from Last 3 Encounters:  08/23/22 128/80  03/31/22 124/80  01/05/22 117/66   Wt Readings from Last 3 Encounters:  09/01/22 185 lb (83.9 kg)  08/23/22 185 lb (83.9 kg)  03/31/22 181 lb 6.4 oz (82.3 kg)    GENERAL: alert, oriented, appears well and in no acute distress  HEENT: atraumatic, conjunttiva clear, no obvious abnormalities on inspection of external nose and ears  NECK: normal movements of the head and neck  LUNGS: on inspection no signs of respiratory distress, breathing rate appears normal, no obvious gross SOB, gasping or wheezing  CV: no obvious cyanosis  MS: moves all visible extremities without noticeable abnormality  PSYCH/NEURO: pleasant and cooperative, no obvious depression or anxiety, speech and thought processing grossly intact  ASSESSMENT AND PLAN: COVID-19 Assessment & Plan: No acute respiratory distress.  Patient is taking Eliquis and we discussed contraindication with starting Paxlovid.  Discussed  molnupiravir.I have counseled on lacking long term safely and effectiveness data of medication,molnupiravir.  Explained EUA for molnupiravir. Criteria met for consideration of  Molnupiravir :  covid positive, patient older than 75 years old, started within 5 days of symptom onset and risk factor for severe disease include: htn, age > 72  Counseled on adverse effects including nausea, dizziness, diarrhea.  Patient is most comfortable and desires to start [place] .  Advised Delsym, Mucinex otc.    Orders: -     molnupiravir EUA; Take 4 capsules (800 mg total) by mouth 2 (two) times daily for 5 days.  Dispense: 40 capsule; Refill: 0  Cough, unspecified type -     POC COVID-19 BinaxNow     -we discussed possible serious and likely etiologies, options for evaluation and workup, limitations of telemedicine visit vs in person visit, treatment, treatment risks and precautions. Pt prefers to treat via telemedicine empirically rather then risking or undertaking an in person visit at this moment.    I discussed the assessment and treatment plan with the patient. The patient was provided an opportunity to ask questions and all were answered. The patient agreed with the plan and demonstrated an understanding of the instructions.   The patient was advised to call back or seek an in-person evaluation if the symptoms worsen or if the condition fails to improve as anticipated.  Advised if desired AVS can be mailed or viewed via [facility] if [facility] user.   Mable Paris, FNP

## 2022-10-09 NOTE — Assessment & Plan Note (Signed)
No acute respiratory distress.  Patient is taking Eliquis and we discussed contraindication with starting Paxlovid.  Discussed molnupiravir.I have counseled on lacking long term safely and effectiveness data of medication,molnupiravir.  Explained EUA for molnupiravir. Criteria met for consideration of  Molnupiravir :  covid positive, patient older than 75 years old, started within 5 days of symptom onset and risk factor for severe disease include: htn, age > 86  Counseled on adverse effects including nausea, dizziness, diarrhea.  Patient is most comfortable and desires to start [place] .  Advised Delsym, Mucinex otc.

## 2022-10-09 NOTE — Patient Instructions (Signed)
You may start PLAIN Mucinex ( guaifenesin) which you can help break up thick congestion.  Please ensure you are drinking plenty of water with this medication.  Please stay in quarantine per cdc guidelines.   If you test positive for COVID-19, stay home for at least 5 days and isolate from others in your home. You are likely most infectious during these first 5 days. Wear a high-quality mask if you must be around others at home and in public. Do not go places where you are unable to wear a mask.   We discussed starting Molnupiravir which is an unapproved drug that is authorized for use under an Emergency Use Authorization.  There are no adequate, approved, available products for the treatment of COVID-19 in adults who have mild-to-moderate COVID-19 and are at high risk for progressing to severe COVID-19, including hospitalization or death.  I have sent  Molnupiravir to your pharmacy. Please call pharmacy so they bring medication out to your car and you do not have to go inside.   This medication is not recommended in pregnancy.    COMMON SIDE EFFECTS: Diarrhea Nausea dizziness   If your COVID-19 symptoms get worse, get medical help right away. Call 911 if you experience symptoms such as worsening cough, trouble breathing, chest pain that doesn't go away, confusion, a hard time staying awake, and pale or blue-colored skin.This medication won't prevent all COVID-19 cases from getting worse.   Molnupiravir Oral Capsules What is this medication? MOLNUPIRAVIR (mol nue pir a vir) treats COVID-19. It is an antiviral medication. It may decrease the risk of developing severe symptoms of COVID-19. It may also decrease the chance of going to the hospital. This medication is not approved by the FDA. The FDA has authorized emergency use of thismedication during the COVID-19 pandemic. This medicine may be used for other purposes; ask your health care provider orpharmacist if you have questions. What should  I tell my care team before I take this medication? They need to know if you have any of these conditions: Any allergies Any serious illness An unusual or allergic reaction to molnupiravir, other medications, foods, dyes, or preservatives Pregnant or trying to get pregnant Breast-feeding How should I use this medication? Take this medication by mouth with water. Take it as directed on the prescription label at the same time every day. Do not cut, crush or chew this medication. Swallow the capsules whole. You can take it with or without food. If it upsets your stomach, take it with food. Take all of this medication unless your care team tells you to stop it early. Keep taking it even if youthink you are better. Talk to your care team about the use of this medication in children. Specialcare may be needed. Overdosage: If you think you have taken too much of this medicine contact apoison control center or emergency room at once. NOTE: This medicine is only for you. Do not share this medicine with others. What if I miss a dose? If you miss a dose, take it as soon as you can unless it is more than 10 hours late. If it is more than 10 hours late, skip the missed dose. Take the next dose at the normal time. Do not take extra or 2 doses at the same time to makeup for the missed dose. What may interact with this medication? Interactions have not been studied. This list may not describe all possible interactions. Give your health care provider a list of all the  medicines, herbs, non-prescription drugs, or dietary supplements you use. Also tell them if you smoke, drink alcohol, or use illegaldrugs. Some items may interact with your medicine. What should I watch for while using this medication? Your condition will be monitored carefully while you are receiving this medication. Visit your care team for regular checkups. Tell your care team ifyour symptoms do not start to get better or if they get worse. Do not  become pregnant while taking this medication. You may need a pregnancy test before starting this medication. Women must use a reliable form of birth control while taking this medication and for 4 days after stopping the medication. Women should inform their care team if they wish to become pregnant or think they might be pregnant. Men should not father a child while taking this medication and for 3 months after stopping it. There is potential for serious harm to an unborn child. Talk to your care team for more information. Do not breast-feed an infant while taking this medication and for 4 days afterstopping the medication. What side effects may I notice from receiving this medication? Side effects that you should report to your care team as soon as possible: Allergic reactions-skin rash, itching, hives, swelling of the face, lips, tongue, or throat Side effects that usually do not require medical attention (report these toyour care team if they continue or are bothersome): Diarrhea Dizziness Nausea This list may not describe all possible side effects. Call your doctor for medical advice about side effects. You may report side effects to FDA at1-800-FDA-1088. Where should I keep my medication? Keep out of the reach of children and pets. Store at room temperature between 20 and 25 degrees C (68 and 77 degrees F).Get rid of any unused medication after the expiration date. To get rid of medications that are no longer needed or have expired: Take the medication to a medication take-back program. Check with your pharmacy or law enforcement to find a location. If you cannot return the medication, check the label or package insert to see if the medication should be thrown out in the garbage or flushed down the toilet. If you are not sure, ask your care team. If it is safe to put it in the trash, take the medication out of the container. Mix the medication with cat litter, dirt, coffee grounds, or other  unwanted substance. Seal the mixture in a bag or container. Put it in the trash. NOTE: This sheet is a summary. It may not cover all possible information. If you have questions about this medicine, talk to your doctor, pharmacist, orhealth care provider.  2022 Elsevier/Gold Standard (2020-08-30 16:16:01)

## 2023-01-19 DIAGNOSIS — I77810 Thoracic aortic ectasia: Secondary | ICD-10-CM | POA: Diagnosis not present

## 2023-01-19 DIAGNOSIS — I10 Essential (primary) hypertension: Secondary | ICD-10-CM | POA: Diagnosis not present

## 2023-01-19 DIAGNOSIS — E785 Hyperlipidemia, unspecified: Secondary | ICD-10-CM | POA: Diagnosis not present

## 2023-01-19 DIAGNOSIS — I480 Paroxysmal atrial fibrillation: Secondary | ICD-10-CM | POA: Diagnosis not present

## 2023-02-01 DIAGNOSIS — N402 Nodular prostate without lower urinary tract symptoms: Secondary | ICD-10-CM | POA: Diagnosis not present

## 2023-04-23 DIAGNOSIS — M3501 Sicca syndrome with keratoconjunctivitis: Secondary | ICD-10-CM | POA: Diagnosis not present

## 2023-04-23 DIAGNOSIS — Z961 Presence of intraocular lens: Secondary | ICD-10-CM | POA: Diagnosis not present

## 2023-04-23 DIAGNOSIS — H8103 Meniere's disease, bilateral: Secondary | ICD-10-CM | POA: Diagnosis not present

## 2023-07-02 ENCOUNTER — Other Ambulatory Visit: Payer: Self-pay | Admitting: Family Medicine

## 2023-07-02 NOTE — Telephone Encounter (Signed)
Patient needs CPE scheduled for December; please call to schedule appt.

## 2023-07-02 NOTE — Telephone Encounter (Signed)
Spoke to pt's wife, scheduled cpe for 09/17/23

## 2023-07-27 ENCOUNTER — Ambulatory Visit (INDEPENDENT_AMBULATORY_CARE_PROVIDER_SITE_OTHER): Payer: Medicare PPO

## 2023-07-27 DIAGNOSIS — Z23 Encounter for immunization: Secondary | ICD-10-CM

## 2023-09-09 ENCOUNTER — Telehealth: Payer: Self-pay

## 2023-09-09 ENCOUNTER — Telehealth: Payer: Self-pay | Admitting: Family Medicine

## 2023-09-09 DIAGNOSIS — Z125 Encounter for screening for malignant neoplasm of prostate: Secondary | ICD-10-CM

## 2023-09-09 DIAGNOSIS — R7303 Prediabetes: Secondary | ICD-10-CM

## 2023-09-09 DIAGNOSIS — N401 Enlarged prostate with lower urinary tract symptoms: Secondary | ICD-10-CM

## 2023-09-09 DIAGNOSIS — I10 Essential (primary) hypertension: Secondary | ICD-10-CM

## 2023-09-09 DIAGNOSIS — E7800 Pure hypercholesterolemia, unspecified: Secondary | ICD-10-CM

## 2023-09-09 NOTE — Telephone Encounter (Signed)
 Left message to call office to reschedule lab appointment. Our office will be closed until 10am due to weather.

## 2023-09-09 NOTE — Telephone Encounter (Signed)
-----   Message from Lovena Neighbours sent at 08/27/2023 11:22 AM EST ----- Regarding: Labs for Monday 1.6.25 Please put physical lab orders in future. Thank you, Denny Peon

## 2023-09-10 ENCOUNTER — Other Ambulatory Visit: Payer: Medicare PPO

## 2023-09-12 ENCOUNTER — Other Ambulatory Visit (INDEPENDENT_AMBULATORY_CARE_PROVIDER_SITE_OTHER): Payer: Medicare PPO

## 2023-09-12 DIAGNOSIS — N401 Enlarged prostate with lower urinary tract symptoms: Secondary | ICD-10-CM

## 2023-09-12 DIAGNOSIS — R7303 Prediabetes: Secondary | ICD-10-CM

## 2023-09-12 DIAGNOSIS — I10 Essential (primary) hypertension: Secondary | ICD-10-CM | POA: Diagnosis not present

## 2023-09-12 DIAGNOSIS — E7800 Pure hypercholesterolemia, unspecified: Secondary | ICD-10-CM

## 2023-09-12 DIAGNOSIS — R350 Frequency of micturition: Secondary | ICD-10-CM

## 2023-09-12 LAB — CBC WITH DIFFERENTIAL/PLATELET
Basophils Absolute: 0.1 10*3/uL (ref 0.0–0.1)
Basophils Relative: 1.4 % (ref 0.0–3.0)
Eosinophils Absolute: 0.2 10*3/uL (ref 0.0–0.7)
Eosinophils Relative: 3.8 % (ref 0.0–5.0)
HCT: 46.7 % (ref 39.0–52.0)
Hemoglobin: 15.4 g/dL (ref 13.0–17.0)
Lymphocytes Relative: 26.1 % (ref 12.0–46.0)
Lymphs Abs: 1.5 10*3/uL (ref 0.7–4.0)
MCHC: 33 g/dL (ref 30.0–36.0)
MCV: 93.8 fL (ref 78.0–100.0)
Monocytes Absolute: 0.7 10*3/uL (ref 0.1–1.0)
Monocytes Relative: 11.8 % (ref 3.0–12.0)
Neutro Abs: 3.2 10*3/uL (ref 1.4–7.7)
Neutrophils Relative %: 56.9 % (ref 43.0–77.0)
Platelets: 424 10*3/uL — ABNORMAL HIGH (ref 150.0–400.0)
RBC: 4.98 Mil/uL (ref 4.22–5.81)
RDW: 12.8 % (ref 11.5–15.5)
WBC: 5.6 10*3/uL (ref 4.0–10.5)

## 2023-09-12 LAB — LIPID PANEL
Cholesterol: 206 mg/dL — ABNORMAL HIGH (ref 0–200)
HDL: 80.1 mg/dL (ref >39.00)
LDL Cholesterol: 112 mg/dL — ABNORMAL HIGH (ref 0–99)
NonHDL: 125.49
Total CHOL/HDL Ratio: 3
Triglycerides: 67 mg/dL (ref 0.0–149.0)
VLDL: 13.4 mg/dL (ref 0.0–40.0)

## 2023-09-12 LAB — COMPREHENSIVE METABOLIC PANEL
ALT: 18 U/L (ref 0–53)
AST: 21 U/L (ref 0–37)
Albumin: 4.4 g/dL (ref 3.5–5.2)
Alkaline Phosphatase: 60 U/L (ref 39–117)
BUN: 15 mg/dL (ref 6–23)
CO2: 31 meq/L (ref 19–32)
Calcium: 9 mg/dL (ref 8.4–10.5)
Chloride: 99 meq/L (ref 96–112)
Creatinine, Ser: 0.69 mg/dL (ref 0.40–1.50)
GFR: 90.56 mL/min (ref >60.00)
Glucose, Bld: 102 mg/dL — ABNORMAL HIGH (ref 70–99)
Potassium: 3.7 meq/L (ref 3.5–5.1)
Sodium: 137 meq/L (ref 135–145)
Total Bilirubin: 0.9 mg/dL (ref 0.2–1.2)
Total Protein: 6.6 g/dL (ref 6.0–8.3)

## 2023-09-12 LAB — HEMOGLOBIN A1C: Hgb A1c MFr Bld: 5.7 % (ref 4.6–6.5)

## 2023-09-12 LAB — TSH: TSH: 1.5 u[IU]/mL (ref 0.35–5.50)

## 2023-09-12 LAB — PSA, MEDICARE: PSA: 4.18 ng/mL — ABNORMAL HIGH (ref 0.10–4.00)

## 2023-09-17 ENCOUNTER — Ambulatory Visit (INDEPENDENT_AMBULATORY_CARE_PROVIDER_SITE_OTHER): Payer: Medicare PPO | Admitting: Family Medicine

## 2023-09-17 VITALS — BP 128/80 | HR 65 | Temp 97.6°F | Ht 70.0 in | Wt 181.1 lb

## 2023-09-17 DIAGNOSIS — I4892 Unspecified atrial flutter: Secondary | ICD-10-CM | POA: Diagnosis not present

## 2023-09-17 DIAGNOSIS — Z0000 Encounter for general adult medical examination without abnormal findings: Secondary | ICD-10-CM | POA: Diagnosis not present

## 2023-09-17 DIAGNOSIS — Z125 Encounter for screening for malignant neoplasm of prostate: Secondary | ICD-10-CM

## 2023-09-17 DIAGNOSIS — R7303 Prediabetes: Secondary | ICD-10-CM | POA: Diagnosis not present

## 2023-09-17 DIAGNOSIS — I10 Essential (primary) hypertension: Secondary | ICD-10-CM | POA: Diagnosis not present

## 2023-09-17 DIAGNOSIS — D696 Thrombocytopenia, unspecified: Secondary | ICD-10-CM | POA: Diagnosis not present

## 2023-09-17 DIAGNOSIS — I719 Aortic aneurysm of unspecified site, without rupture: Secondary | ICD-10-CM | POA: Diagnosis not present

## 2023-09-17 DIAGNOSIS — E7800 Pure hypercholesterolemia, unspecified: Secondary | ICD-10-CM

## 2023-09-17 DIAGNOSIS — Z1211 Encounter for screening for malignant neoplasm of colon: Secondary | ICD-10-CM

## 2023-09-17 MED ORDER — PRAVASTATIN SODIUM 80 MG PO TABS: 80.0000 mg | ORAL_TABLET | Freq: Every day | ORAL | 3 refills | Status: DC

## 2023-09-17 NOTE — Assessment & Plan Note (Addendum)
 Reviewed health habits including diet and exercise and skin cancer prevention Reviewed appropriate screening tests for age  Also reviewed health mt list, fam hx and immunization status , as well as social and family history   See HPI Labs reviewed and ordered Pt may consider shingrix and tetanus shots at pharmacy Will follow up with urology for psa of 4.18 and voiding symptoms  Colonoscopy utd 01/2022 Discussed fall prevention, supplements and exercise for bone density  Utd derm care/goes yearly PHQ of 2

## 2023-09-17 NOTE — Patient Instructions (Addendum)
 If you are interested in the new shingles vaccine (Shingrix) - call your local pharmacy to check on coverage and availability  You are due for a tetanus shot also - ask pharmacist also    Check in with your urologist about  Your psa is up a bit 4.18  Given them a call and let them know   Keep walking Add some strength training to your routine, this is important for bone and brain health and can reduce your risk of falls and help your body use insulin properly and regulate weight  Light weights, exercise bands , and internet videos are a good way to start  Yoga (chair or regular), machines , floor exercises or a gym with machines are also good options    Get back on track for eating  Avoid red meat/ fried foods/ egg yolks/ fatty breakfast meats/ butter, cheese and high fat dairy/ and shellfish  Let's check your cholesterol in 3 months

## 2023-09-17 NOTE — Assessment & Plan Note (Signed)
 Colonoscopy 01/2022 with 5 y recall depending on health as pt will be over 77

## 2023-09-17 NOTE — Assessment & Plan Note (Signed)
 No clinical changes Continues cardiology care Blood pressure is controlled  Cholesterol is controlled

## 2023-09-17 NOTE — Assessment & Plan Note (Signed)
 Mild Plt 424 No clotting/bleeding changes On eliquis  Will monitor / re check 3 mo

## 2023-09-17 NOTE — Assessment & Plan Note (Signed)
 bp in fair control at this time  BP Readings from Last 1 Encounters:  09/17/23 128/80   No changes needed Most recent labs reviewed  Disc lifstyle change with low sodium diet and exercise  Plan to continue Hctz 25 mg daily  Calan xr 120 mg daily

## 2023-09-17 NOTE — Assessment & Plan Note (Signed)
 Psa is up  Lab Results  Component Value Date   PSA 4.18 (H) 09/12/2023   PSA 3.39 08/16/2022   PSA 2.63 02/08/2021    Sees urology for bph /psa  Encouraged pt to follow up  More voiding symptoms recently

## 2023-09-17 NOTE — Progress Notes (Signed)
 Subjective:    Patient ID: Marvin Pacheco, male    DOB: [birth year], 78 y.o.   MRN: [id number]  HPI  Here for health maintenance exam and to review chronic medical problems   Wt Readings from Last 3 Encounters:  09/17/23 181 lb 2 oz (82.2 kg)  09/01/22 185 lb (83.9 kg)  08/23/22 185 lb (83.9 kg)   25.99 kg/m  Vitals:   09/17/23 1000  BP: 128/80  Pulse: 65  Temp: 97.6 F (36.4 C)  SpO2: 98%    Immunization History  Administered Date(s) Administered   Fluad Quad(high Dose 65+) 06/10/2019, 06/02/2020   Fluad Trivalent(High Dose 65+) 07/27/2023   Influenza Split 07/20/2011, 05/22/2012   Influenza Whole 06/05/2007, 09/04/2009, 08/24/2010   Influenza, High Dose Seasonal PF 06/03/2017, 08/29/2018, 06/28/2022   Influenza,inj,Quad PF,6+ Mos 05/27/2013, 06/02/2016   Influenza-Unspecified 06/08/2014, 06/09/2015   Moderna Sars-Covid-2 Vaccination 10/02/2019, 10/30/2019   Pneumococcal Conjugate-13 06/08/2014   Pneumococcal Polysaccharide-23 05/27/2013   Td 09/04/2000   Tdap 11/02/2010   Zoster, Live 12/02/2010    Health Maintenance Due  Topic Date Due   Zoster Vaccines- Shingrix (1 of 2) 03/27/1967   DTaP/Tdap/Td (3 - Td or Tdap) 11/01/2020   Medicare Annual Wellness (AWV)  09/02/2023   Shingrix-interested at pharmacy  Tetanus -interested at pharmacy    Prostate health Lab Results  Component Value Date   PSA 4.18 (H) 09/12/2023   PSA 3.39 08/16/2022   PSA 2.63 02/08/2021   Has BPH Urology psa was 3.56 in 01/2023 . That was up from 2.58 at [facility]  Has a prostate nodule Was seen 01/2023  More frequency and dribbling lately    Colon cancer screening  Colonoscopy 01/2022 with 5 y recall for polyps if applicable at that age   Bone health  Falls-none  Fractures-none  Supplements -vitaminD   Exercise : walking every day   Derm care : goes to dermatology regularly  Has had moh's in the past  Does wear sun protection   Mood    09/17/2023   10:42 AM  09/01/2022    1:47 PM 03/31/2022    2:58 PM 02/17/2021    9:47 AM 12/15/2019   11:22 AM  Depression screen PHQ 2/9  Decreased Interest 0 0 0 0 0  Down, Depressed, Hopeless 0 0 0 0 0  PHQ - 2 Score 0 0 0 0 0  Altered sleeping 0   0 0  Tired, decreased energy 1   0 0  Change in appetite 1   0 0  Feeling bad or failure about yourself  0   0 0  Trouble concentrating 0   0 0  Moving slowly or fidgety/restless 0   0 0  Suicidal thoughts 0   0 0  PHQ-9 Score 2   0 0  Difficult doing work/chores Not difficult at all   Not difficult at all Not difficult at all    HTN bp is stable today  No cp or palpitations or headaches or edema  No side effects to medicines  BP Readings from Last 3 Encounters:  09/17/23 128/80  08/23/22 128/80  03/31/22 124/80     Hydrochlorothiazide 25 mg daily  Calan xr 120 mg daily   Lab Results  Component Value Date   NA 137 09/12/2023   K 3.7 09/12/2023   CO2 31 09/12/2023   GLUCOSE 102 (H) 09/12/2023   BUN 15 09/12/2023   CREATININE 0.69 09/12/2023   CALCIUM 9.0 09/12/2023   GFR  90.56 09/12/2023   GFRNONAA >60 11/19/2011   Hyperlipidemia Lab Results  Component Value Date   CHOL 206 (H) 09/12/2023   CHOL 181 08/16/2022   CHOL 172 02/08/2021   Lab Results  Component Value Date   HDL 80.10 09/12/2023   HDL 78.60 08/16/2022   HDL 76.70 02/08/2021   Lab Results  Component Value Date   LDLCALC 112 (H) 09/12/2023   LDLCALC 85 08/16/2022   LDLCALC 81 02/08/2021   Lab Results  Component Value Date   TRIG 67.0 09/12/2023   TRIG 89.0 08/16/2022   TRIG 74.0 02/08/2021   Lab Results  Component Value Date   CHOLHDL 3 09/12/2023   CHOLHDL 2 08/16/2022   CHOLHDL 2 02/08/2021   Lab Results  Component Value Date   LDLDIRECT 129.8 05/22/2013   LDLDIRECT 106.2 04/08/2012   LDLDIRECT 142.7 10/19/2009   Pravastatin  80 mg daily  Was eating holiday food - LDL is up   Prediabetes Lab Results  Component Value Date   HGBA1C 5.7 09/12/2023    HGBA1C 5.6 08/16/2022   HGBA1C 5.6 02/08/2021   Continues to see ENT for Meniere's dz of left ear and hearing loss   Lab Results  Component Value Date   NA 137 09/12/2023   K 3.7 09/12/2023   CO2 31 09/12/2023   GLUCOSE 102 (H) 09/12/2023   BUN 15 09/12/2023   CREATININE 0.69 09/12/2023   CALCIUM 9.0 09/12/2023   GFR 90.56 09/12/2023   GFRNONAA >60 11/19/2011   Lab Results  Component Value Date   ALT 18 09/12/2023   AST 21 09/12/2023   ALKPHOS 60 09/12/2023   BILITOT 0.9 09/12/2023    Lab Results  Component Value Date   TSH 1.50 09/12/2023    Lab Results  Component Value Date   WBC 5.6 09/12/2023   HGB 15.4 09/12/2023   HCT 46.7 09/12/2023   MCV 93.8 09/12/2023   PLT 424.0 (H) 09/12/2023   Will watch the cbc    Patient Active Problem List   Diagnosis Date Noted   Thrombocytopenia (HCC) 09/17/2023   COVID-19 10/09/2022   Essential hypertension 02/17/2021   Medicare annual wellness visit, subsequent 02/17/2021   Tachycardia 12/22/2019   Welcome to Medicare preventive visit 10/21/2018   BPH (benign prostatic hyperplasia) 10/21/2018   Snoring 08/21/2016   Prediabetes 07/08/2015   Aortic aneurysm (HCC) 07/14/2014   History of colonic polyps 09/18/2012   Colon cancer screening 05/22/2012   Meniere disease 01/15/2012   ANA positive 01/15/2012   Adverse effects of medication 01/15/2012   Vertigo 12/15/2011   Atrial flutter (HCC) 12/15/2011   Routine general medical examination at a health care facility 10/03/2011   Prostate cancer screening 10/03/2011   Allergic rhinitis 04/08/2008   HYPERCHOLESTEROLEMIA 01/09/2008   Past Medical History:  Diagnosis Date   Aortic aneurysm (HCC)    4 cm- watched by cardiology per pt   Atrial flutter (HCC)    Basal cell carcinoma    on her face   Cataract    Hyperlipidemia    Meniere disease    Pseudophakia    Seasonal allergies    Sleep apnea    Past Surgical History:  Procedure Laterality Date   ABLATION   06/2019   Atrial fib flutter. REPEAT 2012   bilateral inguinal hernia  1990   CATARACT EXTRACTION     COLONOSCOPY     lipoma abdomen  2000   revision hernia     2000 and 2006. Revision of  inguinal hernia   TOOTH EXTRACTION     Social History   Tobacco Use   Smoking status: Never   Smokeless tobacco: Never  Vaping Use   Vaping status: Never Used  Substance Use Topics   Alcohol use: No    Alcohol/week: 0.0 standard drinks of alcohol   Drug use: No   Family History  Problem Relation Age of Onset   Abnormal newborn screen Sister    Colon cancer Neg Hx    Stomach cancer Neg Hx    Allergies  Allergen Reactions   Pepcid Ac [Famotidine]     Abdominal cramping   Current Outpatient Medications on File Prior to Visit  Medication Sig Dispense Refill   apixaban (ELIQUIS) 5 MG TABS tablet Take 5 mg by mouth 2 (two) times daily.     Ascorbic Acid (VITAMIN C) 1000 MG tablet Take 1,000 mg by mouth daily.     cholecalciferol (VITAMIN D) 1000 UNITS tablet Take 4,000 Units by mouth daily.     hydrochlorothiazide (HYDRODIURIL) 25 MG tablet Take by mouth.     Multiple Vitamin (MULTIVITAMIN) capsule Take 1 capsule by mouth daily.     Multiple Vitamins-Minerals (ICAPS AREDS 2 PO) Take 1 capsule by mouth 2 (two) times daily.     NONFORMULARY OR COMPOUNDED ITEM Take 1 capsule by mouth daily. BETAHISTINE     Potassium Chloride Crys CR (KLOR-CON M20 PO) Take 1 tablet by mouth daily.     verapamil (CALAN-SR) 120 MG CR tablet Take 120 mg by mouth daily.     co-enzyme Q-10 50 MG capsule Take 50 mg by mouth daily. (Patient not taking: Reported on 09/17/2023)     No current facility-administered medications on file prior to visit.    Review of Systems  Constitutional:  Negative for activity change, appetite change, fatigue, fever and unexpected weight change.  HENT:  Negative for congestion, rhinorrhea, sore throat and trouble swallowing.   Eyes:  Negative for pain, redness, itching and visual  disturbance.  Respiratory:  Negative for cough, chest tightness, shortness of breath and wheezing.   Cardiovascular:  Negative for chest pain and palpitations.  Gastrointestinal:  Negative for abdominal pain, blood in stool, constipation, diarrhea and nausea.  Endocrine: Negative for cold intolerance, heat intolerance, polydipsia and polyuria.  Genitourinary:  Negative for difficulty urinating, dysuria, frequency and urgency.  Musculoskeletal:  Positive for arthralgias. Negative for joint swelling and myalgias.       Left knee pain after twisting injury  Some swelling in knee and ankle  Skin:  Negative for pallor and rash.  Neurological:  Negative for dizziness, tremors, weakness, numbness and headaches.  Hematological:  Negative for adenopathy. Does not bruise/bleed easily.  Psychiatric/Behavioral:  Negative for decreased concentration and dysphoric mood. The patient is not nervous/anxious.        Objective:   Physical Exam Constitutional:      General: He is not in acute distress.    Appearance: Normal appearance. He is well-developed and normal weight. He is not ill-appearing or diaphoretic.  HENT:     Head: Normocephalic and atraumatic.     Right Ear: Tympanic membrane, ear canal and external ear normal.     Left Ear: Tympanic membrane, ear canal and external ear normal.     Nose: Nose normal. No congestion.     Mouth/Throat:     Mouth: Mucous membranes are moist.     Pharynx: Oropharynx is clear. No posterior oropharyngeal erythema.  Eyes:  General: No scleral icterus.       Right eye: No discharge.        Left eye: No discharge.     Conjunctiva/sclera: Conjunctivae normal.     Pupils: Pupils are equal, round, and reactive to light.  Neck:     Thyroid : No thyromegaly.     Vascular: No carotid bruit or JVD.  Cardiovascular:     Rate and Rhythm: Normal rate and regular rhythm.     Pulses: Normal pulses.     Heart sounds: Normal heart sounds.     No gallop.  Pulmonary:      Effort: Pulmonary effort is normal. No respiratory distress.     Breath sounds: Normal breath sounds. No wheezing or rales.     Comments: Good air exch Chest:     Chest wall: No tenderness.  Abdominal:     General: Bowel sounds are normal. There is no distension or abdominal bruit.     Palpations: Abdomen is soft. There is no mass.     Tenderness: There is no abdominal tenderness.     Hernia: No hernia is present.     Comments: More fat deposition over abdomen  No M  Musculoskeletal:        General: No tenderness.     Cervical back: Normal range of motion and neck supple. No rigidity. No muscular tenderness.     Right lower leg: No edema.     Left lower leg: Edema present.     Comments: Some swelling of left knee and ankle   Lymphadenopathy:     Cervical: No cervical adenopathy.  Skin:    General: Skin is warm and dry.     Coloration: Skin is not pale.     Findings: No erythema or rash.  Neurological:     Mental Status: He is alert.     Cranial Nerves: No cranial nerve deficit.     Motor: No abnormal muscle tone.     Coordination: Coordination normal.     Gait: Gait normal.     Deep Tendon Reflexes: Reflexes are normal and symmetric. Reflexes normal.  Psychiatric:        Mood and Affect: Mood normal.        Cognition and Memory: Cognition and memory normal.           Assessment & Plan:   Problem List Items Addressed This Visit       Cardiovascular and Mediastinum   Essential hypertension   bp in fair control at this time  BP Readings from Last 1 Encounters:  09/17/23 128/80   No changes needed Most recent labs reviewed  Disc lifstyle change with low sodium diet and exercise  Plan to continue Hctz 25 mg daily  Calan xr 120 mg daily      Relevant Medications   pravastatin  (PRAVACHOL ) 80 MG tablet   Atrial flutter (HCC)   Continues cardiology care Calan xr 120 mg daily with rate control Eliquis        Relevant Medications   pravastatin   (PRAVACHOL ) 80 MG tablet   Aortic aneurysm (HCC)   No clinical changes Continues cardiology care Blood pressure is controlled  Cholesterol is controlled       Relevant Medications   pravastatin  (PRAVACHOL ) 80 MG tablet     Hematopoietic and Hemostatic   Thrombocytopenia (HCC)   Mild Plt 424 No clotting/bleeding changes On eliquis  Will monitor / re check 3 mo  Other   Routine general medical examination at a health care facility - Primary   Reviewed health habits including diet and exercise and skin cancer prevention Reviewed appropriate screening tests for age  Also reviewed health mt list, fam hx and immunization status , as well as social and family history   See HPI Labs reviewed and ordered Pt may consider shingrix and tetanus shots at pharmacy Will follow up with urology for psa of 4.18 and voiding symptoms  Colonoscopy utd 01/2022 Discussed fall prevention, supplements and exercise for bone density  Utd derm care/goes yearly       Prostate cancer screening   Psa is up  Lab Results  Component Value Date   PSA 4.18 (H) 09/12/2023   PSA 3.39 08/16/2022   PSA 2.63 02/08/2021    Sees urology for bph /psa  Encouraged pt to follow up  More voiding symptoms recently       Prediabetes   Lab Results  Component Value Date   HGBA1C 5.7 09/12/2023   disc imp of low glycemic diet and wt loss to prevent DM2  Will continue to monitor       HYPERCHOLESTEROLEMIA   Disc goals for lipids and reasons to control them Rev last labs with pt Rev low sat fat diet in detail LDL is up a bit to 112 after holiday eating  On pravastatin  80 mg daily   Plan to re check in 3 mo        Relevant Medications   pravastatin  (PRAVACHOL ) 80 MG tablet   Colon cancer screening   Colonoscopy 01/2022 with 5 y recall depending on health as pt will be over 77

## 2023-09-17 NOTE — Assessment & Plan Note (Signed)
 Continues cardiology care Calan xr 120 mg daily with rate control Eliquis

## 2023-09-17 NOTE — Assessment & Plan Note (Addendum)
 Disc goals for lipids and reasons to control them Rev last labs with pt Rev low sat fat diet in detail LDL is up a bit to 112 after holiday eating  On pravastatin 80 mg daily   Plan to re check in 3 mo

## 2023-09-17 NOTE — Assessment & Plan Note (Signed)
 Lab Results  Component Value Date   HGBA1C 5.7 09/12/2023   disc imp of low glycemic diet and wt loss to prevent DM2  Will continue to monitor

## 2023-09-18 NOTE — Progress Notes (Signed)
 Marvin Nevin T. Sharnise Blough, MD, CAQ Sports Medicine [facility] [address] [place], [address]  Phone: [phone]  FAX: [phone]  Marvin Pacheco - 78 y.o. male  MRN [id number]  Date of Birth: 1945-09-07  Date: 09/19/2023  PCP: Clemens Curt, MD  Referral: Clemens Curt, MD  Chief Complaint  Patient presents with   Knee Pain    Left Knee Injury-Twisted Knee playing basketball day before Thanksgiving   Calf Pain    Left   Subjective:   Marvin Pacheco is a 78 y.o. very pleasant male patient with Body mass index is 26.46 kg/m. who presents with the following:  The patient presents with some ongoing left-sided knee pain.  Patient is here with his wife who provides additional history.  They day before thanksgiving was playing basketball and twisted his knee really bad. Almost fell and had a lot of pain At this point, the predominant area of his pain is in the posterior upper calf.  He does have significant swelling throughout the entirety of the left lower extremity and to a lesser extent just above the knee and the thigh.  He is having difficulty going up and down stairs, he was going up and down stairs with only 1 foot at a time right now.  He does feel a popping sensation with movement and walking.  No old injures.  No prior operations. On Eliquis for a fib  [phone] - home phone [phone] cell  Review of Systems is noted in the HPI, as appropriate  Objective:   BP 118/68 (BP Location: Right Arm, Patient Position: Sitting, Cuff Size: Normal)   Pulse 69   Temp 97.8 F (36.6 C) (Temporal)   Ht 5\' 10"  (1.778 m)   Wt 184 lb 6 oz (83.6 kg)   SpO2 98%   BMI 26.46 kg/m   GEN: No acute distress; alert,appropriate. PULM: Breathing comfortably in no respiratory distress PSYCH: Normally interactive.   Left knee: Minimal effusion.  Full extension.  Flexion to 120. Mild pain with manipulation of the patella Mild medial and  lateral joint line tenderness Stable to varus and valgus stress ACL and PCL are intact on Lachman and drawer testing  Homan's test is positive  He has 1+ lower extremity edema through the entirety of the left lower extremity and foot There is no redness or warmth  Laboratory and Imaging Data: US  Venous Img Lower Unilateral Left (DVT) Result Date: 09/19/2023 CLINICAL DATA:  Left lower extremity pain and swelling EXAM: LEFT LOWER EXTREMITY VENOUS DOPPLER ULTRASOUND TECHNIQUE: Gray-scale sonography with compression, as well as color and duplex ultrasound, were performed to evaluate the deep venous system(s) from the level of the common femoral vein through the popliteal and proximal calf veins. COMPARISON:  None Available. FINDINGS: VENOUS Normal compressibility of the common femoral, superficial femoral, and popliteal veins, as well as the visualized calf veins. Visualized portions of profunda femoral vein and great saphenous vein unremarkable. No filling defects to suggest DVT on grayscale or color Doppler imaging. Doppler waveforms show normal direction of venous flow, normal respiratory plasticity and response to augmentation. Limited views of the contralateral common femoral vein are unremarkable. OTHER Small collapsed fluid collection in the posterior aspect of the calf measuring 4.7 x 1.5 x 2.2 cm. The imaging appearance suggests a recently ruptured Baker's cyst. Limitations: none IMPRESSION: 1. No evidence of deep venous thrombosis in the left lower extremity. 2. Collapsed fluid collection in the posterior popliteal  fossa with fluid extending down into the calf suggesting a recently ruptured Baker's cyst. Electronically Signed   By: Fernando Hoyer M.D.   On: 09/19/2023 11:40     Assessment and Plan:     ICD-10-CM   1. Left leg pain  M79.605 US  Venous Img Lower Unilateral Left (DVT)    2. Acute pain of left knee  M25.562     3. Left leg swelling  M79.89 US  Venous Img Lower Unilateral Left  (DVT)    4. Pain of left calf  M79.662 US  Venous Img Lower Unilateral Left (DVT)    5. Ruptured Bakers cyst  M66.0      My primary concern with acute left-sided global left swelling and a positive Homans test would be for possible DVT.  I did obtain a stat DVT ultrasound workup, which was negative for DVT.  There is a large fluid collection consistent with Baker's cyst rupture into the upper calf.  I think that this explains the patient's pain, swelling, and is a byproduct of his injury.  For now he is going to do a compressive knee sleeve and take some Tylenol for pain  I would anticipate this will improve with additional time.  Right now left knee and leg pain is causing him some functional disability   Orders placed today for conditions managed today: Orders Placed This Encounter  Procedures   US  Venous Img Lower Unilateral Left (DVT)    Disposition: prn  [employer] One speech-to-text software was used for transcription in this dictation.  Possible transcriptional errors can occur using Animal nutritionist.   Signed,  Ranny Bye. Lavilla Delamora, MD   Outpatient Encounter Medications as of 09/19/2023  Medication Sig   apixaban (ELIQUIS) 5 MG TABS tablet Take 5 mg by mouth 2 (two) times daily.   Ascorbic Acid (VITAMIN C) 1000 MG tablet Take 1,000 mg by mouth daily.   cholecalciferol (VITAMIN D) 1000 UNITS tablet Take 4,000 Units by mouth daily.   hydrochlorothiazide (HYDRODIURIL) 25 MG tablet Take by mouth.   Multiple Vitamin (MULTIVITAMIN) capsule Take 1 capsule by mouth daily.   Multiple Vitamins-Minerals (ICAPS AREDS 2 PO) Take 1 capsule by mouth 2 (two) times daily.   NONFORMULARY OR COMPOUNDED ITEM Take 1 capsule by mouth daily. BETAHISTINE   Potassium Chloride Crys CR (KLOR-CON M20 PO) Take 1 tablet by mouth daily.   pravastatin  (PRAVACHOL ) 80 MG tablet Take 1 tablet (80 mg total) by mouth daily.   verapamil (CALAN-SR) 120 MG CR tablet Take 120 mg by mouth daily.    co-enzyme Q-10 50 MG capsule Take 50 mg by mouth daily. (Patient not taking: Reported on 09/19/2023)   No facility-administered encounter medications on file as of 09/19/2023.

## 2023-09-19 ENCOUNTER — Encounter: Payer: Self-pay | Admitting: Family Medicine

## 2023-09-19 ENCOUNTER — Ambulatory Visit: Payer: Medicare PPO | Admitting: Family Medicine

## 2023-09-19 ENCOUNTER — Ambulatory Visit
Admission: RE | Admit: 2023-09-19 | Discharge: 2023-09-19 | Disposition: A | Discharge status: Home / Self Care | Payer: Medicare PPO | Source: Ambulatory Visit | Attending: Family Medicine | Admitting: Family Medicine

## 2023-09-19 VITALS — BP 118/68 | HR 69 | Temp 97.8°F | Ht 70.0 in | Wt 184.4 lb

## 2023-09-19 DIAGNOSIS — M7122 Synovial cyst of popliteal space [Baker], left knee: Secondary | ICD-10-CM | POA: Diagnosis not present

## 2023-09-19 DIAGNOSIS — M79662 Pain in left lower leg: Secondary | ICD-10-CM | POA: Diagnosis not present

## 2023-09-19 DIAGNOSIS — M660 Rupture of popliteal cyst: Secondary | ICD-10-CM

## 2023-09-19 DIAGNOSIS — M79605 Pain in left leg: Secondary | ICD-10-CM | POA: Diagnosis not present

## 2023-09-19 DIAGNOSIS — M7989 Other specified soft tissue disorders: Secondary | ICD-10-CM | POA: Insufficient documentation

## 2023-09-19 DIAGNOSIS — M25562 Pain in left knee: Secondary | ICD-10-CM | POA: Diagnosis not present

## 2023-09-19 NOTE — Patient Instructions (Signed)
 Voltaren 1% gel, over the counter ?You can apply up to 4 times a day ? ?This can be applied to any joint: knee, wrist, fingers, elbows, shoulders, feet and ankles. ?Can apply to any tendon: tennis elbow, achilles, tendon, rotator cuff or any other tendon. ? ?Minimal is absorbed in the bloodstream: ok with oral anti-inflammatory or a blood thinner. ? ?Cost is about 9 dollars  ?

## 2023-12-05 ENCOUNTER — Ambulatory Visit (INDEPENDENT_AMBULATORY_CARE_PROVIDER_SITE_OTHER): Admitting: Family Medicine

## 2023-12-05 ENCOUNTER — Encounter: Payer: Self-pay | Admitting: Family Medicine

## 2023-12-05 ENCOUNTER — Ambulatory Visit (INDEPENDENT_AMBULATORY_CARE_PROVIDER_SITE_OTHER)
Admission: RE | Admit: 2023-12-05 | Discharge: 2023-12-05 | Disposition: A | Discharge status: Home / Self Care | Source: Ambulatory Visit | Attending: Family Medicine | Admitting: Family Medicine

## 2023-12-05 VITALS — BP 122/76 | HR 68 | Temp 97.9°F | Ht 70.0 in | Wt 180.5 lb

## 2023-12-05 DIAGNOSIS — G8929 Other chronic pain: Secondary | ICD-10-CM

## 2023-12-05 DIAGNOSIS — M25562 Pain in left knee: Secondary | ICD-10-CM

## 2023-12-05 DIAGNOSIS — M79662 Pain in left lower leg: Secondary | ICD-10-CM | POA: Diagnosis not present

## 2023-12-05 DIAGNOSIS — M25551 Pain in right hip: Secondary | ICD-10-CM | POA: Diagnosis not present

## 2023-12-05 DIAGNOSIS — M79661 Pain in right lower leg: Secondary | ICD-10-CM | POA: Diagnosis not present

## 2023-12-05 NOTE — Patient Instructions (Addendum)
 Xray of knee today  We will reach out with result    PT referral  I put the referral in  Please let us know if you don't hear in 1-2 weeks     Hold pravastatin for 1 week -then let me know if let pain improves at all  If not-then go back on it   Use ice on knee instead of heat  Use some voltaren gel over the counter on the knee as directed

## 2023-12-05 NOTE — Assessment & Plan Note (Signed)
 May be due to gait change from knee issue Also on statin Instructed to hold statin for 1 week just to see if it helps  Ref to PT

## 2023-12-05 NOTE — Assessment & Plan Note (Signed)
 Suspect troch bursitis based on exam Gait has changed due to left knee problem   Ref to PT  Encouraged use of ice for this and knee

## 2023-12-05 NOTE — Assessment & Plan Note (Signed)
 Acute after injury in November  That was followed by ruptured baker's cyst  Now continued discomfort and intermittent swelling  Interested in PT since hamstrings and calves are very tight Gait has changed as well   Reviewed Dr Cyndie Chime note from January  Reviewed LE [place] from January   Today- xray of knee notes no osseous abn (reassuring)   Exam is reassuring -noted some tenderness in patellar tendon and patellofemoral areas   Ref to PT  Ice  Compression prn   Call back and Er precautions noted in detail today

## 2023-12-05 NOTE — Progress Notes (Signed)
 Subjective:    Patient ID: Marvin Pacheco, male    DOB: 1945-12-04, 78 y.o.   MRN: [id number]  HPI  Wt Readings from Last 3 Encounters:  12/05/23 180 lb 8 oz (81.9 kg)  09/19/23 184 lb 6 oz (83.6 kg)  09/17/23 181 lb 2 oz (82.2 kg)   25.90 kg/m  Vitals:   12/05/23 0857  BP: 122/76  Pulse: 68  Temp: 97.9 F (36.6 C)  SpO2: 98%    Pt presents for bilateral leg pain     Did have knee injury in nov 2024  Per note/ Dr Patsy Lager from 10/02/23 My primary concern with acute left-sided global left swelling and a positive Homans test would be for possible DVT.  I did obtain a stat DVT ultrasound workup, which was negative for DVT.   There is a large fluid collection consistent with Baker's cyst rupture into the upper calf.  I think that this explains the patient's pain, swelling, and is a byproduct of his injury.   For now he is going to do a compressive knee sleeve and take some Tylenol for pain   I would anticipate this will improve with additional time.   Right now left knee and leg pain is causing him some functional disability  Ultrasound noted :  IMPRESSION: 1. No evidence of deep venous thrombosis in the left lower extremity. 2. Collapsed fluid collection in the posterior popliteal fossa with fluid extending down into the calf suggesting a recently ruptured Baker's cyst.  Now :   Left leg  Swelling is improved but there is a little some days but not others  Knee catches every once in a while / it is painful    (like it tries to go out on him)  Occasionally left ankle feels tingle/burns (just in am for about 10 minutes)   Does have pain at other times  Mainly when walking /stairs bother him  Cannot localize the pain area  Dull ache  No pain in bed (does put pillow between knees)  No tenderness    Both calves ache at times / occational some sharp  Walking  Climbing stairs  Feel tight    Right hip (posterior lateral thigh,  not the groin) bothers him   Dull and persistent  Sitting/ still  Also when walking    Feels like his gait has changed -his wife noticed it   Hamstrings feel very tight as well   Was formerly very flexible   Wants to get PT   Back does not bother him    Over the counter  Ice to start  Uses heat now    Xray of left knee today DG Knee 4 Views W/Patella Left Result Date: 12/05/2023 CLINICAL DATA:  Injury last November with pain. EXAM: LEFT KNEE - COMPLETE 4 VIEW COMPARISON:  None Available. FINDINGS: No fracture or dislocation. Preserved bone mineralization and joint spaces. No joint effusion. The right knee was included on the frontal view and is also grossly preserved. Dedicated right knee films could be performed as clinically appropriate. IMPRESSION: No acute osseous abnormality. Electronically Signed   By: Karen Kays M.D.   On: 12/05/2023 10:36     Patient Active Problem List   Diagnosis Date Noted   Left knee pain 12/05/2023   Bilateral calf pain 12/05/2023   Lateral pain of right hip 12/05/2023   Thrombocytopenia (HCC) 09/17/2023   COVID-19 10/09/2022   Essential hypertension 02/17/2021   Medicare annual wellness visit, subsequent  02/17/2021   Tachycardia 12/22/2019   Welcome to Medicare preventive visit 10/21/2018   BPH (benign prostatic hyperplasia) 10/21/2018   Snoring 08/21/2016   Prediabetes 07/08/2015   Aortic aneurysm (HCC) 07/14/2014   History of colonic polyps 09/18/2012   Colon cancer screening 05/22/2012   Meniere disease 01/15/2012   ANA positive 01/15/2012   Adverse effects of medication 01/15/2012   Vertigo 12/15/2011   Atrial flutter (HCC) 12/15/2011   Routine general medical examination at a health care facility 10/03/2011   Prostate cancer screening 10/03/2011   Allergic rhinitis 04/08/2008   HYPERCHOLESTEROLEMIA 01/09/2008   Past Medical History:  Diagnosis Date   Aortic aneurysm (HCC)    4 cm- watched by cardiology per pt   Atrial flutter (HCC)    Basal cell  carcinoma    on her face   Cataract    Hyperlipidemia    Meniere disease    Pseudophakia    Seasonal allergies    Sleep apnea    Past Surgical History:  Procedure Laterality Date   ABLATION  06/2019   Atrial fib flutter. REPEAT 2012   bilateral inguinal hernia  1990   CATARACT EXTRACTION     COLONOSCOPY     lipoma abdomen  2000   revision hernia     2000 and 2006. Revision of inguinal hernia   TOOTH EXTRACTION     Social History   Tobacco Use   Smoking status: Never   Smokeless tobacco: Never  Vaping Use   Vaping status: Never Used  Substance Use Topics   Alcohol use: No    Alcohol/week: 0.0 standard drinks of alcohol   Drug use: No   Family History  Problem Relation Age of Onset   Abnormal newborn screen Sister    Colon cancer Neg Hx    Stomach cancer Neg Hx    Allergies  Allergen Reactions   Pepcid Ac [Famotidine]     Abdominal cramping   Current Outpatient Medications on File Prior to Visit  Medication Sig Dispense Refill   apixaban (ELIQUIS) 5 MG TABS tablet Take 5 mg by mouth 2 (two) times daily.     Ascorbic Acid (VITAMIN C) 1000 MG tablet Take 1,000 mg by mouth daily.     cholecalciferol (VITAMIN D) 1000 UNITS tablet Take 4,000 Units by mouth daily.     co-enzyme Q-10 50 MG capsule Take 50 mg by mouth daily.     hydrochlorothiazide (HYDRODIURIL) 25 MG tablet Take by mouth.     Multiple Vitamin (MULTIVITAMIN) capsule Take 1 capsule by mouth daily.     Multiple Vitamins-Minerals (ICAPS AREDS 2 PO) Take 1 capsule by mouth 2 (two) times daily.     NONFORMULARY OR COMPOUNDED ITEM Take 1 capsule by mouth daily. BETAHISTINE     Potassium Chloride Crys CR (KLOR-CON M20 PO) Take 1 tablet by mouth daily.     pravastatin (PRAVACHOL) 80 MG tablet Take 1 tablet (80 mg total) by mouth daily. 90 tablet 3   verapamil (CALAN-SR) 120 MG CR tablet Take 120 mg by mouth daily.     No current facility-administered medications on file prior to visit.    Review of Systems   Constitutional:  Positive for fatigue.  Musculoskeletal:  Positive for arthralgias, gait problem and myalgias. Negative for joint swelling.       Objective:   Physical Exam Constitutional:      General: He is not in acute distress.    Appearance: Normal appearance. He is normal weight. He  is not ill-appearing or diaphoretic.  Eyes:     Conjunctiva/sclera: Conjunctivae normal.     Pupils: Pupils are equal, round, and reactive to light.  Musculoskeletal:     Comments: Knee left  No effusion ,mild superficial swelling  Unable to palpate a baker's cyst posteriorlu No warmth to the touch  Mild crepitus  ROM:  Flex almost full with tightness and pain  Ext -full Mcmurray-some anterior pain  Bounce test -normal   Stability: Anterior drawer-normal  Lachman exam -normal, good end pt   Tenderness -diffuse- patellar tendon/patello femoral / some lateral joint line  Gait favors RLE   Mild tenderness right greater trochanter Normal rom of both hips   Some mild tenderness bilat calves without swelling or palp cord    Skin:    Coloration: Skin is not pale.     Findings: No erythema or rash.  Neurological:     Mental Status: He is alert.     Sensory: No sensory deficit.     Motor: No weakness.  Psychiatric:        Mood and Affect: Mood normal.           Assessment & Plan:   Problem List Items Addressed This Visit       Other   Left knee pain - Primary   Acute after injury in November  That was followed by ruptured baker's cyst  Now continued discomfort and intermittent swelling  Interested in PT since hamstrings and calves are very tight Gait has changed as well   Reviewed Dr Cyndie Chime note from January  Reviewed LE [place] from January   Today- xray of knee notes no osseous abn (reassuring)   Exam is reassuring -noted some tenderness in patellar tendon and patellofemoral areas   Ref to PT  Ice  Compression prn   Call back and Er precautions noted in  detail today        Relevant Orders   DG Knee 4 Views W/Patella Left (Completed)   Ambulatory referral to Physical Therapy   Lateral pain of right hip   Suspect troch bursitis based on exam Gait has changed due to left knee problem   Ref to PT  Encouraged use of ice for this and knee      Relevant Orders   Ambulatory referral to Physical Therapy   Bilateral calf pain   May be due to gait change from knee issue Also on statin Instructed to hold statin for 1 week just to see if it helps  Ref to PT      Relevant Orders   Ambulatory referral to Physical Therapy

## 2023-12-11 ENCOUNTER — Encounter: Payer: Self-pay | Admitting: Family Medicine

## 2023-12-11 ENCOUNTER — Ambulatory Visit (INDEPENDENT_AMBULATORY_CARE_PROVIDER_SITE_OTHER): Admitting: Family Medicine

## 2023-12-11 ENCOUNTER — Ambulatory Visit: Payer: Self-pay

## 2023-12-11 VITALS — BP 124/76 | HR 73 | Temp 97.9°F | Ht 70.0 in | Wt 182.2 lb

## 2023-12-11 DIAGNOSIS — H9202 Otalgia, left ear: Secondary | ICD-10-CM

## 2023-12-11 DIAGNOSIS — R519 Headache, unspecified: Secondary | ICD-10-CM

## 2023-12-11 NOTE — Assessment & Plan Note (Addendum)
 Pain behind left ear/ left neck/ left side of scalp to top and to a lesser degree over face and TMJ area  Reassuring exam No neuro deficits  Slight TMJ tenderness   In pt with meniere's and past history of migraine  Hearing is worse in left ear this week but no dizziness  No fever  Some pnd but no cold symptoms   No rash whatsoever   Differential includes trigeminal or occip neuralgia , early zoster without rash, temporal arteritis (oddly no tenderness over temple), TMJ pain   Doubt OM -normal ear exam   Lab today for cbc, esr, crp  Watch for rash  Pending result  Consider prednisone if no rash develops Instructed pt to update ENT as well due to possible atypical meniere's symptoms and recent loss of hearing

## 2023-12-11 NOTE — Assessment & Plan Note (Signed)
 See a/p for facial pain

## 2023-12-11 NOTE — Patient Instructions (Signed)
 Watch very carefully for rash anywhere you have pain including scalp /neck and face  Let us know asap if you notice any   Labs today  Depending on results   Use a warm or cool compress if it helps   Touch base with your ENT just so they know that you have this facial/outer ear pain and also decrease in hearing and hissing sound    If symptoms get severe -go to the ER   Avoid over chewing / don't chew gum   Use a pillow that supports your neck well and does not cause pain    If new symptoms let us know

## 2023-12-11 NOTE — Telephone Encounter (Signed)
  Chief Complaint: pain Symptoms: pain scalp/jaw/head Frequency: constant Pertinent Negatives: Patient denies fever, injury and all other symptoms Disposition: [] ED /[] Urgent Care (no appt availability in office) / [x] Appointment(In office/virtual)/ []  [facility]/ [] Home Care/ [] Refused Recommended Disposition /[] [facility]/ []  Follow-up with PCP Additional Notes:  Scalp behind ear is painful for 2 days. Painful is worse when eating and talking. Tylenol not helpful. Denies all other symptoms. Acute visit scheduled today with PCP. Educated on care advice as documented in protocol, patient verbalized understanding.    Copied from CRM [id number]. Topic: Clinical - Red Word Triage >> Dec 11, 2023  9:31 AM [place] A wrote: [facility] that prompted transfer to Nurse Triage: Patient said for the past two days he has had pain on left of his head behind his ears 1-10;8 or 9 Reason for Disposition  [1] MODERATE headache (e.g., interferes with normal activities) AND [2] present > 24 hours AND [3] unexplained  (Exceptions: analgesics not tried, typical migraine, or headache part of viral illness)  Protocols used: Headache-A-AH

## 2023-12-11 NOTE — Progress Notes (Signed)
 Subjective:    Patient ID: Marvin Pacheco, male    DOB: Sep 28, 1945, 78 y.o.   MRN: [id number]  HPI  Wt Readings from Last 3 Encounters:  12/11/23 182 lb 4 oz (82.7 kg)  12/05/23 180 lb 8 oz (81.9 kg)  09/19/23 184 lb 6 oz (83.6 kg)   26.15 kg/m  Vitals:   12/11/23 1430  BP: 124/76  Pulse: 73  Temp: 97.9 F (36.6 C)  SpO2: 97%    Pt presents for pain on left side of head behind ear   Started yesterday am  Dull pain - the more sharp with position change  No numb or tingly  No ear pressure or roaring   Hurts when he opens his mouth   No history of TMJ Does not grind teeth   No rash so far  A little sensitive/tender to the touch  Not throbbing/ is constant  Did have a little vision flashing briefly   A little light sensitive No vision change  Not dizzy     No nausea   Inside of ear does not bother him  Hearing worse in past week     He did have terrible headaches with meniere's - years ago (was labeled as optical migraines)  Would cause photophobia and put him in bed and cause balance to be worse          He has a history of meniere disease  Takes eliquis for a flutter   HTN bp is stable today  No cp or palpitations or headaches or edema  No side effects to medicines  BP Readings from Last 3 Encounters:  12/11/23 124/76  12/05/23 122/76  09/19/23 118/68    Hydrochlorothiazide 25 mg daily  Calan sr 120 mg daily    Patient Active Problem List   Diagnosis Date Noted   Pain behind the ear, left 12/11/2023   Scalp pain 12/11/2023   Facial pain 12/11/2023   Left knee pain 12/05/2023   Bilateral calf pain 12/05/2023   Lateral pain of right hip 12/05/2023   Thrombocytopenia (HCC) 09/17/2023   COVID-19 10/09/2022   Essential hypertension 02/17/2021   Medicare annual wellness visit, subsequent 02/17/2021   Tachycardia 12/22/2019   Welcome to Medicare preventive visit 10/21/2018   BPH (benign prostatic hyperplasia) 10/21/2018   Snoring  08/21/2016   Prediabetes 07/08/2015   Aortic aneurysm (HCC) 07/14/2014   History of colonic polyps 09/18/2012   Colon cancer screening 05/22/2012   Meniere disease 01/15/2012   ANA positive 01/15/2012   Adverse effects of medication 01/15/2012   Vertigo 12/15/2011   Atrial flutter (HCC) 12/15/2011   Routine general medical examination at a health care facility 10/03/2011   Prostate cancer screening 10/03/2011   Allergic rhinitis 04/08/2008   HYPERCHOLESTEROLEMIA 01/09/2008   Past Medical History:  Diagnosis Date   Aortic aneurysm (HCC)    4 cm- watched by cardiology per pt   Atrial flutter (HCC)    Basal cell carcinoma    on her face   Cataract    Hyperlipidemia    Meniere disease    Pseudophakia    Seasonal allergies    Sleep apnea    Past Surgical History:  Procedure Laterality Date   ABLATION  06/2019   Atrial fib flutter. REPEAT 2012   bilateral inguinal hernia  1990   CATARACT EXTRACTION     COLONOSCOPY     lipoma abdomen  2000   revision hernia     2000 and 2006.  Revision of inguinal hernia   TOOTH EXTRACTION     Social History   Tobacco Use   Smoking status: Never   Smokeless tobacco: Never  Vaping Use   Vaping status: Never Used  Substance Use Topics   Alcohol use: No    Alcohol/week: 0.0 standard drinks of alcohol   Drug use: No   Family History  Problem Relation Age of Onset   Abnormal newborn screen Sister    Colon cancer Neg Hx    Stomach cancer Neg Hx    Allergies  Allergen Reactions   Pepcid Ac [Famotidine]     Abdominal cramping   Current Outpatient Medications on File Prior to Visit  Medication Sig Dispense Refill   apixaban (ELIQUIS) 5 MG TABS tablet Take 5 mg by mouth 2 (two) times daily.     Ascorbic Acid (VITAMIN C) 1000 MG tablet Take 1,000 mg by mouth daily.     cholecalciferol (VITAMIN D) 1000 UNITS tablet Take 4,000 Units by mouth daily.     co-enzyme Q-10 50 MG capsule Take 50 mg by mouth daily.     hydrochlorothiazide  (HYDRODIURIL) 25 MG tablet Take by mouth.     Multiple Vitamin (MULTIVITAMIN) capsule Take 1 capsule by mouth daily.     Multiple Vitamins-Minerals (ICAPS AREDS 2 PO) Take 1 capsule by mouth 2 (two) times daily.     NONFORMULARY OR COMPOUNDED ITEM Take 1 capsule by mouth daily. BETAHISTINE     Potassium Chloride Crys CR (KLOR-CON M20 PO) Take 1 tablet by mouth daily.     pravastatin (PRAVACHOL) 80 MG tablet Take 1 tablet (80 mg total) by mouth daily. 90 tablet 3   verapamil (CALAN-SR) 120 MG CR tablet Take 120 mg by mouth daily.     No current facility-administered medications on file prior to visit.    Review of Systems  Constitutional:  Negative for activity change, appetite change, fatigue, fever and unexpected weight change.  HENT:  Positive for ear pain, hearing loss and postnasal drip. Negative for congestion, ear discharge, facial swelling, rhinorrhea, sore throat and trouble swallowing.   Eyes:  Negative for pain, discharge, redness, itching and visual disturbance.       Little to no photophobia   Respiratory:  Negative for cough, chest tightness, shortness of breath and wheezing.   Cardiovascular:  Negative for chest pain and palpitations.  Gastrointestinal:  Negative for abdominal pain, blood in stool, constipation, diarrhea and nausea.  Endocrine: Negative for cold intolerance, heat intolerance, polydipsia and polyuria.  Genitourinary:  Negative for difficulty urinating, dysuria, frequency and urgency.  Musculoskeletal:  Negative for arthralgias, joint swelling and myalgias.  Skin:  Negative for pallor and rash.  Neurological:  Negative for dizziness, tremors, seizures, syncope, facial asymmetry, speech difficulty, weakness, light-headedness, numbness and headaches.  Hematological:  Negative for adenopathy. Does not bruise/bleed easily.  Psychiatric/Behavioral:  Negative for decreased concentration and dysphoric mood. The patient is not nervous/anxious.        Objective:    Physical Exam Constitutional:      General: He is not in acute distress.    Appearance: Normal appearance. He is well-developed and normal weight. He is not ill-appearing or diaphoretic.  HENT:     Head: Normocephalic and atraumatic.     Comments: No temporal tenderness  Some mild TMJ tenderness w/o crepitus   Very mild left scalp tenderness    Right Ear: Tympanic membrane, ear canal and external ear normal. There is no impacted cerumen.  Left Ear: Tympanic membrane, ear canal and external ear normal. There is no impacted cerumen.     Nose: Nose normal.     Comments: Boggy nares     Mouth/Throat:     Mouth: Mucous membranes are moist.     Pharynx: Oropharynx is clear. No oropharyngeal exudate or posterior oropharyngeal erythema.  Eyes:     General: No scleral icterus.       Right eye: No discharge.        Left eye: No discharge.     Conjunctiva/sclera: Conjunctivae normal.     Pupils: Pupils are equal, round, and reactive to light.     Comments: No nystagmus  Neck:     Thyroid: No thyromegaly.     Vascular: No carotid bruit or JVD.     Trachea: No tracheal deviation.     Comments: Some mild tenderness in left cervical musculature and occiput area  Cardiovascular:     Rate and Rhythm: Normal rate and regular rhythm.     Heart sounds: Normal heart sounds. No murmur heard. Pulmonary:     Effort: Pulmonary effort is normal. No respiratory distress.     Breath sounds: Normal breath sounds. No stridor. No wheezing, rhonchi or rales.  Abdominal:     General: Bowel sounds are normal. There is no distension.     Palpations: Abdomen is soft. There is no mass.     Tenderness: There is no abdominal tenderness.  Musculoskeletal:     Cervical back: Full passive range of motion without pain, normal range of motion and neck supple. Tenderness present. No rigidity.     Right lower leg: No edema.     Left lower leg: No edema.  Lymphadenopathy:     Cervical: No cervical adenopathy.   Skin:    General: Skin is warm and dry.     Coloration: Skin is not jaundiced or pale.     Findings: No bruising, erythema, lesion or rash.  Neurological:     Mental Status: He is alert and oriented to person, place, and time.     Cranial Nerves: No cranial nerve deficit, dysarthria or facial asymmetry.     Sensory: Sensation is intact. No sensory deficit.     Motor: No weakness, tremor, atrophy, abnormal muscle tone, seizure activity or pronator drift.     Coordination: Romberg sign negative. Coordination normal.     Gait: Gait normal.     Deep Tendon Reflexes: Reflexes are normal and symmetric.     Comments: No focal cerebellar signs   Psychiatric:        Behavior: Behavior normal.        Thought Content: Thought content normal.           Assessment & Plan:   Problem List Items Addressed This Visit       Other   Scalp pain   See a/p for facial pain       Relevant Orders   CBC with Differential/Platelet   Sedimentation Rate   C-reactive protein   Comprehensive metabolic panel with GFR   Pain behind the ear, left   See a/p for facial pain  In setting of meniere's  No rash      Relevant Orders   CBC with Differential/Platelet   Sedimentation Rate   C-reactive protein   Comprehensive metabolic panel with GFR   Facial pain - Primary   Pain behind left ear/ left neck/ left side of scalp to top and to a  lesser degree over face and TMJ area  Reassuring exam No neuro deficits  Slight TMJ tenderness   In pt with meniere's and past history of migraine  Hearing is worse in left ear this week but no dizziness  No fever  Some pnd but no cold symptoms   No rash whatsoever   Differential includes trigeminal or occip neuralgia , early zoster without rash, temporal arteritis (oddly no tenderness over temple), TMJ pain   Doubt OM -normal ear exam   Lab today for cbc, esr, crp  Watch for rash  Pending result  Consider prednisone if no rash develops Instructed pt  to update ENT as well due to possible atypical meniere's symptoms and recent loss of hearing       Relevant Orders   CBC with Differential/Platelet   Sedimentation Rate   C-reactive protein   Comprehensive metabolic panel with GFR

## 2023-12-11 NOTE — Telephone Encounter (Signed)
 Appt scheduled today at 2:30 with PCP

## 2023-12-11 NOTE — Assessment & Plan Note (Signed)
 See a/p for facial pain  In setting of meniere's  No rash

## 2023-12-12 ENCOUNTER — Encounter: Payer: Self-pay | Admitting: Family Medicine

## 2023-12-12 LAB — COMPREHENSIVE METABOLIC PANEL WITH GFR
ALT: 19 U/L (ref 0–53)
AST: 20 U/L (ref 0–37)
Albumin: 4.5 g/dL (ref 3.5–5.2)
Alkaline Phosphatase: 54 U/L (ref 39–117)
BUN: 11 mg/dL (ref 6–23)
CO2: 27 meq/L (ref 19–32)
Calcium: 9 mg/dL (ref 8.4–10.5)
Chloride: 98 meq/L (ref 96–112)
Creatinine, Ser: 0.69 mg/dL (ref 0.40–1.50)
GFR: 90.4 mL/min (ref >60.00)
Glucose, Bld: 161 mg/dL — ABNORMAL HIGH (ref 70–99)
Potassium: 3.4 meq/L — ABNORMAL LOW (ref 3.5–5.1)
Sodium: 134 meq/L — ABNORMAL LOW (ref 135–145)
Total Bilirubin: 0.7 mg/dL (ref 0.2–1.2)
Total Protein: 6.5 g/dL (ref 6.0–8.3)

## 2023-12-12 LAB — CBC WITH DIFFERENTIAL/PLATELET
Basophils Absolute: 0 10*3/uL (ref 0.0–0.1)
Basophils Relative: 0.8 % (ref 0.0–3.0)
Eosinophils Absolute: 0.2 10*3/uL (ref 0.0–0.7)
Eosinophils Relative: 2.9 % (ref 0.0–5.0)
HCT: 44.6 % (ref 39.0–52.0)
Hemoglobin: 15 g/dL (ref 13.0–17.0)
Lymphocytes Relative: 27.5 % (ref 12.0–46.0)
Lymphs Abs: 1.7 10*3/uL (ref 0.7–4.0)
MCHC: 33.7 g/dL (ref 30.0–36.0)
MCV: 92.2 fl (ref 78.0–100.0)
Monocytes Absolute: 0.7 10*3/uL (ref 0.1–1.0)
Monocytes Relative: 11.6 % (ref 3.0–12.0)
Neutro Abs: 3.5 10*3/uL (ref 1.4–7.7)
Neutrophils Relative %: 57.2 % (ref 43.0–77.0)
Platelets: 343 10*3/uL (ref 150.0–400.0)
RBC: 4.84 Mil/uL (ref 4.22–5.81)
RDW: 13.2 % (ref 11.5–15.5)
WBC: 6.1 10*3/uL (ref 4.0–10.5)

## 2023-12-12 LAB — SEDIMENTATION RATE: Sed Rate: 5 mm/h (ref 0–20)

## 2023-12-12 LAB — C-REACTIVE PROTEIN: CRP: <1 mg/dL (ref 0.5–20.0)

## 2023-12-13 ENCOUNTER — Ambulatory Visit: Attending: Family Medicine | Admitting: Physical Therapy

## 2023-12-13 DIAGNOSIS — M25551 Pain in right hip: Secondary | ICD-10-CM | POA: Diagnosis not present

## 2023-12-13 DIAGNOSIS — M25562 Pain in left knee: Secondary | ICD-10-CM | POA: Insufficient documentation

## 2023-12-13 DIAGNOSIS — M6281 Muscle weakness (generalized): Secondary | ICD-10-CM | POA: Diagnosis not present

## 2023-12-13 DIAGNOSIS — R269 Unspecified abnormalities of gait and mobility: Secondary | ICD-10-CM

## 2023-12-13 DIAGNOSIS — M25651 Stiffness of right hip, not elsewhere classified: Secondary | ICD-10-CM | POA: Insufficient documentation

## 2023-12-13 DIAGNOSIS — M79661 Pain in right lower leg: Secondary | ICD-10-CM | POA: Insufficient documentation

## 2023-12-13 DIAGNOSIS — G8929 Other chronic pain: Secondary | ICD-10-CM

## 2023-12-13 DIAGNOSIS — M79662 Pain in left lower leg: Secondary | ICD-10-CM | POA: Diagnosis not present

## 2023-12-13 DIAGNOSIS — M25652 Stiffness of left hip, not elsewhere classified: Secondary | ICD-10-CM | POA: Diagnosis not present

## 2023-12-17 ENCOUNTER — Other Ambulatory Visit: Payer: Medicare PPO

## 2023-12-18 ENCOUNTER — Other Ambulatory Visit

## 2023-12-18 ENCOUNTER — Encounter: Payer: Self-pay | Admitting: Family Medicine

## 2023-12-18 ENCOUNTER — Encounter: Payer: Self-pay | Admitting: Physical Therapy

## 2023-12-18 ENCOUNTER — Ambulatory Visit: Admitting: Physical Therapy

## 2023-12-18 DIAGNOSIS — M6281 Muscle weakness (generalized): Secondary | ICD-10-CM

## 2023-12-18 DIAGNOSIS — M79661 Pain in right lower leg: Secondary | ICD-10-CM | POA: Diagnosis not present

## 2023-12-18 DIAGNOSIS — M79662 Pain in left lower leg: Secondary | ICD-10-CM | POA: Diagnosis not present

## 2023-12-18 DIAGNOSIS — M25551 Pain in right hip: Secondary | ICD-10-CM

## 2023-12-18 DIAGNOSIS — G8929 Other chronic pain: Secondary | ICD-10-CM | POA: Diagnosis not present

## 2023-12-18 DIAGNOSIS — M25651 Stiffness of right hip, not elsewhere classified: Secondary | ICD-10-CM | POA: Diagnosis not present

## 2023-12-18 DIAGNOSIS — M25652 Stiffness of left hip, not elsewhere classified: Secondary | ICD-10-CM

## 2023-12-18 DIAGNOSIS — R269 Unspecified abnormalities of gait and mobility: Secondary | ICD-10-CM | POA: Diagnosis not present

## 2023-12-18 DIAGNOSIS — M25562 Pain in left knee: Secondary | ICD-10-CM | POA: Diagnosis not present

## 2023-12-18 MED ORDER — PREDNISONE 10 MG PO TABS: ORAL_TABLET | ORAL | 0 refills | Status: DC

## 2023-12-20 ENCOUNTER — Ambulatory Visit: Admitting: Physical Therapy

## 2023-12-20 DIAGNOSIS — M79662 Pain in left lower leg: Secondary | ICD-10-CM | POA: Diagnosis not present

## 2023-12-20 DIAGNOSIS — M6281 Muscle weakness (generalized): Secondary | ICD-10-CM

## 2023-12-20 DIAGNOSIS — G8929 Other chronic pain: Secondary | ICD-10-CM | POA: Diagnosis not present

## 2023-12-20 DIAGNOSIS — M25562 Pain in left knee: Secondary | ICD-10-CM | POA: Diagnosis not present

## 2023-12-20 DIAGNOSIS — M25551 Pain in right hip: Secondary | ICD-10-CM | POA: Diagnosis not present

## 2023-12-20 DIAGNOSIS — M25652 Stiffness of left hip, not elsewhere classified: Secondary | ICD-10-CM | POA: Diagnosis not present

## 2023-12-20 DIAGNOSIS — M79661 Pain in right lower leg: Secondary | ICD-10-CM | POA: Diagnosis not present

## 2023-12-20 DIAGNOSIS — M25651 Stiffness of right hip, not elsewhere classified: Secondary | ICD-10-CM | POA: Diagnosis not present

## 2023-12-20 DIAGNOSIS — R269 Unspecified abnormalities of gait and mobility: Secondary | ICD-10-CM | POA: Diagnosis not present

## 2023-12-22 NOTE — Therapy (Signed)
 OUTPATIENT PHYSICAL THERAPY LOWER EXTREMITY TREATMENT  Patient Name: Marvin Pacheco MRN: [id number] DOB:[birth year], 78 y.o., male Today's Date: 12/20/2023  END OF SESSION:  PT End of Session - 12/22/23 1416     Visit Number 3    Number of Visits 16    Date for PT Re-Evaluation 02/07/24    PT Start Time 1254    PT Stop Time 1345    PT Time Calculation (min) 51 min            Past Medical History:  Diagnosis Date   Aortic aneurysm (HCC)    4 cm- watched by cardiology per pt   Atrial flutter (HCC)    Basal cell carcinoma    on her face   Cataract    Hyperlipidemia    Meniere disease    Pseudophakia    Seasonal allergies    Sleep apnea    Past Surgical History:  Procedure Laterality Date   ABLATION  06/2019   Atrial fib flutter. REPEAT 2012   bilateral inguinal hernia  1990   CATARACT EXTRACTION     COLONOSCOPY     lipoma abdomen  2000   revision hernia     2000 and 2006. Revision of inguinal hernia   TOOTH EXTRACTION     Patient Active Problem List   Diagnosis Date Noted   Pain behind the ear, left 12/11/2023   Scalp pain 12/11/2023   Facial pain 12/11/2023   Left knee pain 12/05/2023   Bilateral calf pain 12/05/2023   Lateral pain of right hip 12/05/2023   Thrombocytopenia (HCC) 09/17/2023   COVID-19 10/09/2022   Essential hypertension 02/17/2021   Medicare annual wellness visit, subsequent 02/17/2021   Tachycardia 12/22/2019   Welcome to Medicare preventive visit 10/21/2018   BPH (benign prostatic hyperplasia) 10/21/2018   Snoring 08/21/2016   Prediabetes 07/08/2015   Aortic aneurysm (HCC) 07/14/2014   History of colonic polyps 09/18/2012   Colon cancer screening 05/22/2012   Meniere disease 01/15/2012   ANA positive 01/15/2012   Adverse effects of medication 01/15/2012   Vertigo 12/15/2011   Atrial flutter (HCC) 12/15/2011   Routine general medical examination at a health care facility 10/03/2011   Prostate cancer screening 10/03/2011    Allergic rhinitis 04/08/2008   HYPERCHOLESTEROLEMIA 01/09/2008    PCP: Clemens Curt, MD  REFERRING PROVIDER: Clemens Curt, MD  REFERRING DIAG:  Diagnosis  [phone] (ICD-10-CM) - Chronic pain of left knee  M79.661,M79.662 (ICD-10-CM) - Bilateral calf pain  M25.551 (ICD-10-CM) - Lateral pain of right hip   THERAPY DIAG:  Chronic pain of left knee  Pain in right hip  Joint stiffness of both hips  Muscle weakness (generalized)  Gait difficulty  Rationale for Evaluation and Treatment: Rehabilitation  ONSET DATE: 07/2023  SUBJECTIVE:   SUBJECTIVE STATEMENT: Pt. Known to PT clinic.  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  Pt. C/o 1-2/10 L knee pain with activity.  Pt. Reports a L knee injury while playing basketball with grandson's during Thanksgiving in 2024.  Pt. Reports a swaying gait, esp. With ascending/ descending stairs.  Pt. 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  Pt. States he was walking 8,000 steps/day and now is walking <6,000 steps/day.    PERTINENT HISTORY: Left knee pain - Primary     Acute after injury in November  That was followed by ruptured baker's cyst  Now continued discomfort and intermittent swelling  Interested in PT  since hamstrings and calves are very tight Gait has changed as well    Reviewed Dr Tasia Farr note from January  Reviewed LE US  from January    Today- xray of knee notes no osseous abn (reassuring)    Exam is reassuring -noted some tenderness in patellar tendon and patellofemoral areas    Ref to PT  Ice  Compression prn    Call back and Er precautions noted in detail today          Relevant Orders    DG Knee 4 Views W/Patella Left (Completed)    Ambulatory referral to Physical Therapy    Lateral pain of right hip    Suspect troch bursitis based on exam Gait has changed due to left knee problem    Ref to PT  Encouraged use of ice for this and knee        Relevant Orders     Ambulatory referral to Physical Therapy    Bilateral calf pain    May be due to gait change from knee issue Also on statin Instructed to hold statin for 1 week just to see if it helps   Ref to PT        Relevant Orders    Ambulatory referral to Physical Therapy   PAIN:  Are you having pain? Yes: NPRS scale: 1-2/10 Pain location: L knee Pain description: aching Aggravating factors: increase activity Relieving factors: rest.   PRECAUTIONS: None  RED FLAGS: None   WEIGHT BEARING RESTRICTIONS: No  FALLS:  Has patient fallen in last 6 months? No  LIVING ENVIRONMENT: Lives with: lives with their spouse Lives in: House/apartment Stairs: Yes: Internal: 16 steps; on right going up Has following equipment at home: None  OCCUPATION: Futures trader  PLOF: Independent  PATIENT GOALS: Increase LE flexibility/ muscle strength to improve pain-free walking/ mobility.    NEXT MD VISIT: PRN  OBJECTIVE:  Note: Objective measures were completed at Evaluation unless otherwise noted.  DIAGNOSTIC FINDINGS: EXAM: LEFT KNEE - COMPLETE 4 VIEW   COMPARISON:  None Available.   FINDINGS: No fracture or dislocation. Preserved bone mineralization and joint spaces. No joint effusion. The right knee was included on the frontal view and is also grossly preserved. Dedicated right knee films could be performed as clinically appropriate.   IMPRESSION: No acute osseous abnormality.     Electronically Signed   By: Adrianna Horde M.D.   On: 12/05/2023 10:36  PATIENT SURVEYS:  LEFS 36 out of 80.  COGNITION: Overall cognitive status: Within functional limits for tasks assessed     SENSATION: WFL  EDEMA:  NT.  No visible swelling in knee joints.    MUSCLE LENGTH: Hamstrings: Right 68 deg; Left 70 deg Thomas test: NT  POSTURE: rounded shoulders, forward head, and increased thoracic kyphosis.   Discussed posture correction ex./ pec stretches.    PALPATION: Negative in  knee/hip  LOWER EXTREMITY ROM:  Active ROM Right eval Left eval  Hip flexion 124 deg. 124 deg.  Hip extension    Hip abduction    Hip adduction    Hip internal rotation 29 deg. 30 deg.  Hip external rotation 34 deg. 32 deg.  Knee flexion [facility]  Knee extension [facility]  Ankle dorsiflexion    Ankle plantarflexion    Ankle inversion    Ankle eversion     (Blank rows = not tested)  LOWER EXTREMITY MMT:  MMT Right eval Left eval  Hip flexion 4+ 4+  Hip  extension    Hip abduction 4 4  Hip adduction    Hip internal rotation 4+ 4+  Hip external rotation 4+ 4+  Knee flexion 5 5  Knee extension 5 5  Ankle dorsiflexion    Ankle plantarflexion    Ankle inversion    Ankle eversion     (Blank rows = not tested)  LOWER EXTREMITY SPECIAL TESTS:  Hip special tests: Portia Brittle (FABER) test: positive  Knee special tests: Anterior drawer test: negative, Posterior drawer test: negative, and McMurray's test: negative.   Negative Thomas test.    FUNCTIONAL TESTS:  5 times sit to stand: TBD  GAIT: Distance walked: in clinic Assistive device utilized: None Level of assistance: Complete Independence Comments: Antalgic gait pattern with limited step length/ BOS.  Stiffness in B hips.                                                                              TREATMENT DATE: 12/20/23  Subjective:  Pt. Reports no new complaints.  Pt. Challenged with seated/supine figure 4 stretching.    Manual tx.:  Supine LE stretches (focus on hamstring/ quad/ gastroc/ hip rotn./ piriformis)- 15 min.    Supine L/R LAD with gentle oscillations.    There.ex.:  Nustep L4 B UE/LE for 10 min.  Discussed HEP.  Standing posture correction t/o tx.  Moderate cuing.  Nautilus resisted gait: 80# forward/backwards.  60# lateral walking 5x each.    Supine LE ex. With white ball: knee to chest/ bridging/ SLR 20x.   No changes to HEP   PATIENT EDUCATION:  Education details: See HEP Person educated:  Patient Education method: Explanation, Demonstration, and Handouts Education comprehension: verbalized understanding and returned demonstration  HOME EXERCISE PROGRAM: Access Code: [email or web address] URL: [email or web address] Date: 12/13/2023 Prepared by: Hazeline Lister  Exercises - Supine Lower Trunk Rotation  - 1 x daily - 5 x weekly - 1 sets - 10 reps - Supine Figure 4 Piriformis Stretch  - 1 x daily - 7 x weekly - 1 sets - 5 reps - Supine Hamstring Stretch  - 1 x daily - 7 x weekly - 1 sets - 5 reps - Supine Bridge  - 1 x daily - 5 x weekly - 2 sets - 10 reps - Seated Piriformis Stretch  - 1 x daily - 5 x weekly - 1 sets - 5 reps - Seated Figure 4 Piriformis Stretch  - 1 x daily - 5 x weekly - 1 sets - 5 reps - Doorway Pec Stretch at 90 Degrees Abduction  - 1 x daily - 5 x weekly - 1 sets - 5 reps - Standing Scapular Retraction with External Rotation  - 1 x daily - 5 x weekly - 2 sets - 10 reps  ASSESSMENT:  CLINICAL IMPRESSION: Pt. Presents with an increase in L/R hip IR and ER after manual stretches.  Pt. Ambulates with antalgic, stiff gait pattern with no assistive device and requires cuing to correction shoulder position/ upright posture.  No c/o hip/knee pain during resisted gait/ supine LE ex.  Pt. Will benefit from skilled PT services to develop home LE stretching/ strengthening ex. Program to improve pain-free mobility.     OBJECTIVE  IMPAIRMENTS: Abnormal gait, decreased activity tolerance, decreased balance, decreased mobility, difficulty walking, decreased ROM, decreased strength, hypomobility, impaired flexibility, improper body mechanics, postural dysfunction, and pain.   ACTIVITY LIMITATIONS: carrying, lifting, bending, standing, squatting, stairs, and locomotion level  PARTICIPATION LIMITATIONS: community activity  PERSONAL FACTORS: Fitness and Past/current experiences are also affecting patient's functional outcome.   REHAB POTENTIAL: Good  CLINICAL DECISION  MAKING: Evolving/moderate complexity  EVALUATION COMPLEXITY: Moderate   GOALS: Goals reviewed with patient? Yes  SHORT TERM GOALS: Target date: 01/03/24 Pt. Will be independent with HEP to increase B hip AROM (IR/ER) 50% to improve pain-free mobility.   Baseline: see aboce Goal status: INITIAL  LONG TERM GOALS: Target date: 02/07/24  Pt. Will increase LEFS to >50 out of 80 to improve pain-free mobility.   Baseline:  36 out of 80 Goal status: INITIAL  2.  Pt. Will report no L knee pain with yardwork/ daily household tasks.   Baseline: 2/10 L knee pain at rest. Goal status: INITIAL  3.  Pt. Able to ascend/descend 16 stairs at home with recip. Gait pattern and no increase c/o knee/hip pain.   Baseline: step to gait/ pain reported.  Goal status: INITIAL  4.  Pt. Able to return to walking >8,000 steps/day with no pain/limitations.   Baseline: <6,000 steps/day Goal status: INITIAL  PLAN:  PT FREQUENCY: 2x/week  PT DURATION: 8 weeks  PLANNED INTERVENTIONS: 97110-Therapeutic exercises, 97530- Therapeutic activity, [id number]- Neuromuscular re-education, 97535- Self Care, [id number]- Manual therapy, [phone]- Gait training, Balance training, Stair training, Joint mobilization, Cryotherapy, and Moist heat  PLAN FOR NEXT SESSION: Progress HEP/ measure hip IR/ER/ hamstring length.  Lendell Quarry, PT, DPT # [phone] 12/22/2023, 2:17 PM

## 2023-12-22 NOTE — Therapy (Signed)
 OUTPATIENT PHYSICAL THERAPY LOWER EXTREMITY TREATMENT  Patient Name: Marvin Pacheco MRN: [id number] DOB:[birth year], 78 y.o., male Today's Date: 12/18/2023  END OF SESSION:  PT End of Session - 12/22/23 1409     Visit Number 2    Number of Visits 16    Date for PT Re-Evaluation 02/07/24    PT Start Time 0856    PT Stop Time 0946    PT Time Calculation (min) 50 min            Past Medical History:  Diagnosis Date   Aortic aneurysm (HCC)    4 cm- watched by cardiology per pt   Atrial flutter (HCC)    Basal cell carcinoma    on her face   Cataract    Hyperlipidemia    Meniere disease    Pseudophakia    Seasonal allergies    Sleep apnea    Past Surgical History:  Procedure Laterality Date   ABLATION  06/2019   Atrial fib flutter. REPEAT 2012   bilateral inguinal hernia  1990   CATARACT EXTRACTION     COLONOSCOPY     lipoma abdomen  2000   revision hernia     2000 and 2006. Revision of inguinal hernia   TOOTH EXTRACTION     Patient Active Problem List   Diagnosis Date Noted   Pain behind the ear, left 12/11/2023   Scalp pain 12/11/2023   Facial pain 12/11/2023   Left knee pain 12/05/2023   Bilateral calf pain 12/05/2023   Lateral pain of right hip 12/05/2023   Thrombocytopenia (HCC) 09/17/2023   COVID-19 10/09/2022   Essential hypertension 02/17/2021   Medicare annual wellness visit, subsequent 02/17/2021   Tachycardia 12/22/2019   Welcome to Medicare preventive visit 10/21/2018   BPH (benign prostatic hyperplasia) 10/21/2018   Snoring 08/21/2016   Prediabetes 07/08/2015   Aortic aneurysm (HCC) 07/14/2014   History of colonic polyps 09/18/2012   Colon cancer screening 05/22/2012   Meniere disease 01/15/2012   ANA positive 01/15/2012   Adverse effects of medication 01/15/2012   Vertigo 12/15/2011   Atrial flutter (HCC) 12/15/2011   Routine general medical examination at a health care facility 10/03/2011   Prostate cancer screening 10/03/2011    Allergic rhinitis 04/08/2008   HYPERCHOLESTEROLEMIA 01/09/2008    PCP: Clemens Curt, MD  REFERRING PROVIDER: Clemens Curt, MD  REFERRING DIAG:  Diagnosis  [phone] (ICD-10-CM) - Chronic pain of left knee  M79.661,M79.662 (ICD-10-CM) - Bilateral calf pain  M25.551 (ICD-10-CM) - Lateral pain of right hip   THERAPY DIAG:  Chronic pain of left knee  Pain in right hip  Joint stiffness of both hips  Muscle weakness (generalized)  Gait difficulty  Rationale for Evaluation and Treatment: Rehabilitation  ONSET DATE: 07/2023  SUBJECTIVE:   SUBJECTIVE STATEMENT: Pt. Known to PT clinic.  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  Pt. C/o 1-2/10 L knee pain with activity.  Pt. Reports a L knee injury while playing basketball with grandson's during Thanksgiving in 2024.  Pt. Reports a swaying gait, esp. With ascending/ descending stairs.  Pt. 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  Pt. States he was walking 8,000 steps/day and now is walking <6,000 steps/day.    PERTINENT HISTORY: Left knee pain - Primary     Acute after injury in November  That was followed by ruptured baker's cyst  Now continued discomfort and intermittent swelling  Interested in PT  since hamstrings and calves are very tight Gait has changed as well    Reviewed Dr Tasia Farr note from January  Reviewed LE US  from January    Today- xray of knee notes no osseous abn (reassuring)    Exam is reassuring -noted some tenderness in patellar tendon and patellofemoral areas    Ref to PT  Ice  Compression prn    Call back and Er precautions noted in detail today          Relevant Orders    DG Knee 4 Views W/Patella Left (Completed)    Ambulatory referral to Physical Therapy    Lateral pain of right hip    Suspect troch bursitis based on exam Gait has changed due to left knee problem    Ref to PT  Encouraged use of ice for this and knee        Relevant Orders     Ambulatory referral to Physical Therapy    Bilateral calf pain    May be due to gait change from knee issue Also on statin Instructed to hold statin for 1 week just to see if it helps   Ref to PT        Relevant Orders    Ambulatory referral to Physical Therapy   PAIN:  Are you having pain? Yes: NPRS scale: 1-2/10 Pain location: L knee Pain description: aching Aggravating factors: increase activity Relieving factors: rest.   PRECAUTIONS: None  RED FLAGS: None   WEIGHT BEARING RESTRICTIONS: No  FALLS:  Has patient fallen in last 6 months? No  LIVING ENVIRONMENT: Lives with: lives with their spouse Lives in: House/apartment Stairs: Yes: Internal: 16 steps; on right going up Has following equipment at home: None  OCCUPATION: Futures trader  PLOF: Independent  PATIENT GOALS: Increase LE flexibility/ muscle strength to improve pain-free walking/ mobility.    NEXT MD VISIT: PRN  OBJECTIVE:  Note: Objective measures were completed at Evaluation unless otherwise noted.  DIAGNOSTIC FINDINGS: EXAM: LEFT KNEE - COMPLETE 4 VIEW   COMPARISON:  None Available.   FINDINGS: No fracture or dislocation. Preserved bone mineralization and joint spaces. No joint effusion. The right knee was included on the frontal view and is also grossly preserved. Dedicated right knee films could be performed as clinically appropriate.   IMPRESSION: No acute osseous abnormality.     Electronically Signed   By: Adrianna Horde M.D.   On: 12/05/2023 10:36  PATIENT SURVEYS:  LEFS 36 out of 80.  COGNITION: Overall cognitive status: Within functional limits for tasks assessed     SENSATION: WFL  EDEMA:  NT.  No visible swelling in knee joints.    MUSCLE LENGTH: Hamstrings: Right 68 deg; Left 70 deg Thomas test: NT  POSTURE: rounded shoulders, forward head, and increased thoracic kyphosis.   Discussed posture correction ex./ pec stretches.    PALPATION: Negative in  knee/hip  LOWER EXTREMITY ROM:  Active ROM Right eval Left eval  Hip flexion 124 deg. 124 deg.  Hip extension    Hip abduction    Hip adduction    Hip internal rotation 29 deg. 30 deg.  Hip external rotation 34 deg. 32 deg.  Knee flexion [facility]  Knee extension [facility]  Ankle dorsiflexion    Ankle plantarflexion    Ankle inversion    Ankle eversion     (Blank rows = not tested)  LOWER EXTREMITY MMT:  MMT Right eval Left eval  Hip flexion 4+ 4+  Hip  extension    Hip abduction 4 4  Hip adduction    Hip internal rotation 4+ 4+  Hip external rotation 4+ 4+  Knee flexion 5 5  Knee extension 5 5  Ankle dorsiflexion    Ankle plantarflexion    Ankle inversion    Ankle eversion     (Blank rows = not tested)  LOWER EXTREMITY SPECIAL TESTS:  Hip special tests: Portia Brittle (FABER) test: positive  Knee special tests: Anterior drawer test: negative, Posterior drawer test: negative, and McMurray's test: negative.   Negative Thomas test.    FUNCTIONAL TESTS:  5 times sit to stand: TBD  GAIT: Distance walked: in clinic Assistive device utilized: None Level of assistance: Complete Independence Comments: Antalgic gait pattern with limited step length/ BOS.  Stiffness in B hips.                                                                              TREATMENT DATE: 12/18/23  Subjective:  Pt. States he is optimistic about PT tx. And motivated to increase flexibility/ strength.  Pt. Had several questions about HEP that PT answered.    Manual tx.:  Supine LE stretches (focus on hamstring/ gastroc/ hip rotn./ piriformis)- 17 min.    There.ex.:  Nustep L4 B UE/LE for 10 min.  Discussed HEP.  Standing posture correction.  Discussed pec. Stretches and scap. Retraction.  Reviewed HEP (in depth).  Ambulate outside on varying terrain.  No c/o hip pain.    PATIENT EDUCATION:  Education details: See HEP Person educated: Patient Education method: Explanation, Demonstration,  and Handouts Education comprehension: verbalized understanding and returned demonstration  HOME EXERCISE PROGRAM: Access Code: [email or web address] URL: [email or web address] Date: 12/13/2023 Prepared by: Hazeline Lister  Exercises - Supine Lower Trunk Rotation  - 1 x daily - 5 x weekly - 1 sets - 10 reps - Supine Figure 4 Piriformis Stretch  - 1 x daily - 7 x weekly - 1 sets - 5 reps - Supine Hamstring Stretch  - 1 x daily - 7 x weekly - 1 sets - 5 reps - Supine Bridge  - 1 x daily - 5 x weekly - 2 sets - 10 reps - Seated Piriformis Stretch  - 1 x daily - 5 x weekly - 1 sets - 5 reps - Seated Figure 4 Piriformis Stretch  - 1 x daily - 5 x weekly - 1 sets - 5 reps - Doorway Pec Stretch at 90 Degrees Abduction  - 1 x daily - 5 x weekly - 1 sets - 5 reps - Standing Scapular Retraction with External Rotation  - 1 x daily - 5 x weekly - 2 sets - 10 reps  ASSESSMENT:  CLINICAL IMPRESSION: Pt. Presents with minimal L knee pain currently and joint stiffness in B hip IR and ER.  Pt. Ambulates with antalgic, stiff gait pattern with no assistive device.  Pt. Compliant with HEP and PT answered questions about hip/piriformis stretches.  Pt. Requires moderate cuing t/o tx. To correct upright posture/ prevent thoracic kyphosis. Pt. Will benefit from skilled PT services to develop home LE stretching/ strengthening ex. Program to improve pain-free mobility.     OBJECTIVE IMPAIRMENTS: Abnormal gait, decreased activity  tolerance, decreased balance, decreased mobility, difficulty walking, decreased ROM, decreased strength, hypomobility, impaired flexibility, improper body mechanics, postural dysfunction, and pain.   ACTIVITY LIMITATIONS: carrying, lifting, bending, standing, squatting, stairs, and locomotion level  PARTICIPATION LIMITATIONS: community activity  PERSONAL FACTORS: Fitness and Past/current experiences are also affecting patient's functional outcome.   REHAB POTENTIAL: Good  CLINICAL  DECISION MAKING: Evolving/moderate complexity  EVALUATION COMPLEXITY: Moderate   GOALS: Goals reviewed with patient? Yes  SHORT TERM GOALS: Target date: 01/03/24 Pt. Will be independent with HEP to increase B hip AROM (IR/ER) 50% to improve pain-free mobility.   Baseline: see aboce Goal status: INITIAL  LONG TERM GOALS: Target date: 02/07/24  Pt. Will increase LEFS to >50 out of 80 to improve pain-free mobility.   Baseline:  36 out of 80 Goal status: INITIAL  2.  Pt. Will report no L knee pain with yardwork/ daily household tasks.   Baseline: 2/10 L knee pain at rest. Goal status: INITIAL  3.  Pt. Able to ascend/descend 16 stairs at home with recip. Gait pattern and no increase c/o knee/hip pain.   Baseline: step to gait/ pain reported.  Goal status: INITIAL  4.  Pt. Able to return to walking >8,000 steps/day with no pain/limitations.   Baseline: <6,000 steps/day Goal status: INITIAL  PLAN:  PT FREQUENCY: 2x/week  PT DURATION: 8 weeks  PLANNED INTERVENTIONS: 97110-Therapeutic exercises, 97530- Therapeutic activity, [id number]- Neuromuscular re-education, 97535- Self Care, [id number]- Manual therapy, [phone]- Gait training, Balance training, Stair training, Joint mobilization, Cryotherapy, and Moist heat  PLAN FOR NEXT SESSION: Reassess HEP  Lendell Quarry, PT, DPT # [phone] 12/22/2023, 2:10 PM

## 2023-12-22 NOTE — Therapy (Signed)
 OUTPATIENT PHYSICAL THERAPY LOWER EXTREMITY EVALUATION  Patient Name: Marvin Pacheco MRN: [id number] DOB:1946/07/14, 78 y.o., male Today's Date: 12/13/2023  END OF SESSION:  PT End of Session - 12/22/23 1158     Visit Number 1    Number of Visits 16    Date for PT Re-Evaluation 02/07/24    PT Start Time 0855    PT Stop Time 0947    PT Time Calculation (min) 52 min            Past Medical History:  Diagnosis Date   Aortic aneurysm (HCC)    4 cm- watched by cardiology per pt   Atrial flutter (HCC)    Basal cell carcinoma    on her face   Cataract    Hyperlipidemia    Meniere disease    Pseudophakia    Seasonal allergies    Sleep apnea    Past Surgical History:  Procedure Laterality Date   ABLATION  06/2019   Atrial fib flutter. REPEAT 2012   bilateral inguinal hernia  1990   CATARACT EXTRACTION     COLONOSCOPY     lipoma abdomen  2000   revision hernia     2000 and 2006. Revision of inguinal hernia   TOOTH EXTRACTION     Patient Active Problem List   Diagnosis Date Noted   Pain behind the ear, left 12/11/2023   Scalp pain 12/11/2023   Facial pain 12/11/2023   Left knee pain 12/05/2023   Bilateral calf pain 12/05/2023   Lateral pain of right hip 12/05/2023   Thrombocytopenia (HCC) 09/17/2023   COVID-19 10/09/2022   Essential hypertension 02/17/2021   Medicare annual wellness visit, subsequent 02/17/2021   Tachycardia 12/22/2019   Welcome to Medicare preventive visit 10/21/2018   BPH (benign prostatic hyperplasia) 10/21/2018   Snoring 08/21/2016   Prediabetes 07/08/2015   Aortic aneurysm (HCC) 07/14/2014   History of colonic polyps 09/18/2012   Colon cancer screening 05/22/2012   Meniere disease 01/15/2012   ANA positive 01/15/2012   Adverse effects of medication 01/15/2012   Vertigo 12/15/2011   Atrial flutter (HCC) 12/15/2011   Routine general medical examination at a health care facility 10/03/2011   Prostate cancer screening 10/03/2011    Allergic rhinitis 04/08/2008   HYPERCHOLESTEROLEMIA 01/09/2008    PCP: Clemens Curt, MD  REFERRING PROVIDER: Clemens Curt, MD  REFERRING DIAG:  Diagnosis  [phone] (ICD-10-CM) - Chronic pain of left knee  M79.661,M79.662 (ICD-10-CM) - Bilateral calf pain  M25.551 (ICD-10-CM) - Lateral pain of right hip   THERAPY DIAG:  Chronic pain of left knee  Pain in right hip  Joint stiffness of both hips  Muscle weakness (generalized)  Gait difficulty  Rationale for Evaluation and Treatment: Rehabilitation  ONSET DATE: 07/2023  SUBJECTIVE:   SUBJECTIVE STATEMENT: Pt. Known to PT clinic.  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  Pt. C/o 1-2/10 L knee pain with activity.  Pt. Reports a L knee injury while playing basketball with grandson's during Thanksgiving in 2024.  Pt. Reports a swaying gait, esp. With ascending/ descending stairs.  Pt. 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  Pt. States he was walking 8,000 steps/day and now is walking <6,000 steps/day.    PERTINENT HISTORY: Left knee pain - Primary     Acute after injury in November  That was followed by ruptured baker's cyst  Now continued discomfort and intermittent swelling  Interested in PT  since hamstrings and calves are very tight Gait has changed as well    Reviewed Dr Tasia Farr note from January  Reviewed LE US  from January    Today- xray of knee notes no osseous abn (reassuring)    Exam is reassuring -noted some tenderness in patellar tendon and patellofemoral areas    Ref to PT  Ice  Compression prn    Call back and Er precautions noted in detail today          Relevant Orders    DG Knee 4 Views W/Patella Left (Completed)    Ambulatory referral to Physical Therapy    Lateral pain of right hip    Suspect troch bursitis based on exam Gait has changed due to left knee problem    Ref to PT  Encouraged use of ice for this and knee        Relevant Orders     Ambulatory referral to Physical Therapy    Bilateral calf pain    May be due to gait change from knee issue Also on statin Instructed to hold statin for 1 week just to see if it helps   Ref to PT        Relevant Orders    Ambulatory referral to Physical Therapy   PAIN:  Are you having pain? Yes: NPRS scale: 1-2/10 Pain location: L knee Pain description: aching Aggravating factors: increase activity Relieving factors: rest.   PRECAUTIONS: None  RED FLAGS: None   WEIGHT BEARING RESTRICTIONS: No  FALLS:  Has patient fallen in last 6 months? No  LIVING ENVIRONMENT: Lives with: lives with their spouse Lives in: House/apartment Stairs: Yes: Internal: 16 steps; on right going up Has following equipment at home: None  OCCUPATION: Futures trader  PLOF: Independent  PATIENT GOALS: Increase LE flexibility/ muscle strength to improve pain-free walking/ mobility.    NEXT MD VISIT: PRN  OBJECTIVE:  Note: Objective measures were completed at Evaluation unless otherwise noted.  DIAGNOSTIC FINDINGS: EXAM: LEFT KNEE - COMPLETE 4 VIEW   COMPARISON:  None Available.   FINDINGS: No fracture or dislocation. Preserved bone mineralization and joint spaces. No joint effusion. The right knee was included on the frontal view and is also grossly preserved. Dedicated right knee films could be performed as clinically appropriate.   IMPRESSION: No acute osseous abnormality.     Electronically Signed   By: Adrianna Horde M.D.   On: 12/05/2023 10:36  PATIENT SURVEYS:  LEFS 36 out of 80.  COGNITION: Overall cognitive status: Within functional limits for tasks assessed     SENSATION: WFL  EDEMA:  NT.  No visible swelling in knee joints.    MUSCLE LENGTH: Hamstrings: Right 68 deg; Left 70 deg Thomas test: NT  POSTURE: rounded shoulders, forward head, and increased thoracic kyphosis.   Discussed posture correction ex./ pec stretches.    PALPATION: Negative in  knee/hip  LOWER EXTREMITY ROM:  Active ROM Right eval Left eval  Hip flexion 124 deg. 124 deg.  Hip extension    Hip abduction    Hip adduction    Hip internal rotation 29 deg. 30 deg.  Hip external rotation 34 deg. 32 deg.  Knee flexion [facility]  Knee extension [facility]  Ankle dorsiflexion    Ankle plantarflexion    Ankle inversion    Ankle eversion     (Blank rows = not tested)  LOWER EXTREMITY MMT:  MMT Right eval Left eval  Hip flexion 4+ 4+  Hip  extension    Hip abduction 4 4  Hip adduction    Hip internal rotation 4+ 4+  Hip external rotation 4+ 4+  Knee flexion 5 5  Knee extension 5 5  Ankle dorsiflexion    Ankle plantarflexion    Ankle inversion    Ankle eversion     (Blank rows = not tested)  LOWER EXTREMITY SPECIAL TESTS:  Hip special tests: Portia Brittle (FABER) test: positive  Knee special tests: Anterior drawer test: negative, Posterior drawer test: negative, and McMurray's test: negative.   Negative Thomas test.    FUNCTIONAL TESTS:  5 times sit to stand: TBD  GAIT: Distance walked: in clinic Assistive device utilized: None Level of assistance: Complete Independence Comments: Antalgic gait pattern with limited step length/ BOS.  Stiffness in B hips.                                                                              TREATMENT DATE: 12/13/23  See evaluation/ HEP    PATIENT EDUCATION:  Education details: See HEP Person educated: Patient Education method: Explanation, Demonstration, and Handouts Education comprehension: verbalized understanding and returned demonstration  HOME EXERCISE PROGRAM: Access Code: [email or web address] URL: [email or web address] Date: 12/13/2023 Prepared by: Hazeline Lister  Exercises - Supine Lower Trunk Rotation  - 1 x daily - 5 x weekly - 1 sets - 10 reps - Supine Figure 4 Piriformis Stretch  - 1 x daily - 7 x weekly - 1 sets - 5 reps - Supine Hamstring Stretch  - 1 x daily - 7 x weekly - 1 sets - 5  reps - Supine Bridge  - 1 x daily - 5 x weekly - 2 sets - 10 reps - Seated Piriformis Stretch  - 1 x daily - 5 x weekly - 1 sets - 5 reps - Seated Figure 4 Piriformis Stretch  - 1 x daily - 5 x weekly - 1 sets - 5 reps - Doorway Pec Stretch at 90 Degrees Abduction  - 1 x daily - 5 x weekly - 1 sets - 5 reps - Standing Scapular Retraction with External Rotation  - 1 x daily - 5 x weekly - 2 sets - 10 reps  ASSESSMENT:  CLINICAL IMPRESSION: Patient is a pleasant 78 y.o. male who was seen today for physical therapy evaluation and treatment for L knee pain/ B hip joint stiffness/ gait difficulty.  Pt. Presents with 1-2/10 L knee pain currently and joint stiffness in B hip IR and ER.  Pt. Ambulates with antalgic, stiff gait pattern with no assistive device.  Pt. Will benefit from skilled PT services to develop home LE stretching/ strengthening ex. Program to improve pain-free mobility.     OBJECTIVE IMPAIRMENTS: Abnormal gait, decreased activity tolerance, decreased balance, decreased mobility, difficulty walking, decreased ROM, decreased strength, hypomobility, impaired flexibility, improper body mechanics, postural dysfunction, and pain.   ACTIVITY LIMITATIONS: carrying, lifting, bending, standing, squatting, stairs, and locomotion level  PARTICIPATION LIMITATIONS: community activity  PERSONAL FACTORS: Fitness and Past/current experiences are also affecting patient's functional outcome.   REHAB POTENTIAL: Good  CLINICAL DECISION MAKING: Evolving/moderate complexity  EVALUATION COMPLEXITY: Moderate   GOALS: Goals reviewed with patient? Yes  SHORT TERM GOALS: Target  date: 01/03/24 Pt. Will be independent with HEP to increase B hip AROM (IR/ER) 50% to improve pain-free mobility.   Baseline: see aboce Goal status: INITIAL  LONG TERM GOALS: Target date: 02/07/24  Pt. Will increase LEFS to >50 out of 80 to improve pain-free mobility.   Baseline:  36 out of 80 Goal status: INITIAL  2.  Pt.  Will report no L knee pain with yardwork/ daily household tasks.   Baseline: 2/10 L knee pain at rest. Goal status: INITIAL  3.  Pt. Able to ascend/descend 16 stairs at home with recip. Gait pattern and no increase c/o knee/hip pain.   Baseline: step to gait/ pain reported.  Goal status: INITIAL  4.  Pt. Able to return to walking >8,000 steps/day with no pain/limitations.   Baseline: <6,000 steps/day Goal status: INITIAL  PLAN:  PT FREQUENCY: 2x/week  PT DURATION: 8 weeks  PLANNED INTERVENTIONS: 97110-Therapeutic exercises, 97530- Therapeutic activity, [id number]- Neuromuscular re-education, 97535- Self Care, [id number]- Manual therapy, [phone]- Gait training, Balance training, Stair training, Joint mobilization, Cryotherapy, and Moist heat  PLAN FOR NEXT SESSION: Reassess HEP  Marvin Pacheco, PT, DPT # [phone] 12/22/2023, 12:02 PM

## 2023-12-24 ENCOUNTER — Other Ambulatory Visit (INDEPENDENT_AMBULATORY_CARE_PROVIDER_SITE_OTHER)

## 2023-12-24 ENCOUNTER — Encounter: Payer: Self-pay | Admitting: Family Medicine

## 2023-12-24 DIAGNOSIS — E7800 Pure hypercholesterolemia, unspecified: Secondary | ICD-10-CM

## 2023-12-24 DIAGNOSIS — D696 Thrombocytopenia, unspecified: Secondary | ICD-10-CM

## 2023-12-24 LAB — CBC WITH DIFFERENTIAL/PLATELET
Basophils Absolute: 0.1 10*3/uL (ref 0.0–0.1)
Basophils Relative: 0.9 % (ref 0.0–3.0)
Eosinophils Absolute: 0.2 10*3/uL (ref 0.0–0.7)
Eosinophils Relative: 3.6 % (ref 0.0–5.0)
HCT: 44.5 % (ref 39.0–52.0)
Hemoglobin: 15 g/dL (ref 13.0–17.0)
Lymphocytes Relative: 16.5 % (ref 12.0–46.0)
Lymphs Abs: 1 10*3/uL (ref 0.7–4.0)
MCHC: 33.8 g/dL (ref 30.0–36.0)
MCV: 92.8 fl (ref 78.0–100.0)
Monocytes Absolute: 1.1 10*3/uL — ABNORMAL HIGH (ref 0.1–1.0)
Monocytes Relative: 19.7 % — ABNORMAL HIGH (ref 3.0–12.0)
Neutro Abs: 3.4 10*3/uL (ref 1.4–7.7)
Neutrophils Relative %: 59.3 % (ref 43.0–77.0)
Platelets: 336 10*3/uL (ref 150.0–400.0)
RBC: 4.8 Mil/uL (ref 4.22–5.81)
RDW: 13.4 % (ref 11.5–15.5)
WBC: 5.8 10*3/uL (ref 4.0–10.5)

## 2023-12-24 LAB — LIPID PANEL
Cholesterol: 188 mg/dL (ref 0–200)
HDL: 71.5 mg/dL (ref >39.00)
LDL Cholesterol: 94 mg/dL (ref 0–99)
NonHDL: 116.88
Total CHOL/HDL Ratio: 3
Triglycerides: 113 mg/dL (ref 0.0–149.0)
VLDL: 22.6 mg/dL (ref 0.0–40.0)

## 2023-12-25 ENCOUNTER — Encounter: Payer: Self-pay | Admitting: Physical Therapy

## 2023-12-25 ENCOUNTER — Encounter: Payer: Self-pay | Admitting: *Deleted

## 2023-12-25 ENCOUNTER — Ambulatory Visit: Admitting: Physical Therapy

## 2023-12-25 DIAGNOSIS — M6281 Muscle weakness (generalized): Secondary | ICD-10-CM | POA: Diagnosis not present

## 2023-12-25 DIAGNOSIS — M25551 Pain in right hip: Secondary | ICD-10-CM

## 2023-12-25 DIAGNOSIS — R269 Unspecified abnormalities of gait and mobility: Secondary | ICD-10-CM | POA: Diagnosis not present

## 2023-12-25 DIAGNOSIS — M25651 Stiffness of right hip, not elsewhere classified: Secondary | ICD-10-CM | POA: Diagnosis not present

## 2023-12-25 DIAGNOSIS — M25652 Stiffness of left hip, not elsewhere classified: Secondary | ICD-10-CM

## 2023-12-25 DIAGNOSIS — M79662 Pain in left lower leg: Secondary | ICD-10-CM | POA: Diagnosis not present

## 2023-12-25 DIAGNOSIS — G8929 Other chronic pain: Secondary | ICD-10-CM

## 2023-12-25 DIAGNOSIS — M25562 Pain in left knee: Secondary | ICD-10-CM | POA: Diagnosis not present

## 2023-12-25 DIAGNOSIS — M79661 Pain in right lower leg: Secondary | ICD-10-CM | POA: Diagnosis not present

## 2023-12-25 NOTE — Therapy (Signed)
 OUTPATIENT PHYSICAL THERAPY LOWER EXTREMITY TREATMENT  Patient Name: Marvin Pacheco MRN: [id number] DOB:28-Feb-1946, 78 y.o., male Today's Date: 12/25/2023  END OF SESSION:  PT End of Session - 12/25/23 0950     Visit Number 4    Number of Visits 16    Date for PT Re-Evaluation 02/07/24    PT Start Time 0748    PT Stop Time 0827    PT Time Calculation (min) 39 min             Past Medical History:  Diagnosis Date   Aortic aneurysm (HCC)    4 cm- watched by cardiology per pt   Atrial flutter (HCC)    Basal cell carcinoma    on her face   Cataract    Hyperlipidemia    Meniere disease    Pseudophakia    Seasonal allergies    Sleep apnea    Past Surgical History:  Procedure Laterality Date   ABLATION  06/2019   Atrial fib flutter. REPEAT 2012   bilateral inguinal hernia  1990   CATARACT EXTRACTION     COLONOSCOPY     lipoma abdomen  2000   revision hernia     2000 and 2006. Revision of inguinal hernia   TOOTH EXTRACTION     Patient Active Problem List   Diagnosis Date Noted   Pain behind the ear, left 12/11/2023   Scalp pain 12/11/2023   Facial pain 12/11/2023   Left knee pain 12/05/2023   Bilateral calf pain 12/05/2023   Lateral pain of right hip 12/05/2023   Thrombocytopenia (HCC) 09/17/2023   COVID-19 10/09/2022   Essential hypertension 02/17/2021   Medicare annual wellness visit, subsequent 02/17/2021   Tachycardia 12/22/2019   Welcome to Medicare preventive visit 10/21/2018   BPH (benign prostatic hyperplasia) 10/21/2018   Snoring 08/21/2016   Prediabetes 07/08/2015   Aortic aneurysm (HCC) 07/14/2014   History of colonic polyps 09/18/2012   Colon cancer screening 05/22/2012   Meniere disease 01/15/2012   ANA positive 01/15/2012   Adverse effects of medication 01/15/2012   Vertigo 12/15/2011   Atrial flutter (HCC) 12/15/2011   Routine general medical examination at a health care facility 10/03/2011   Prostate cancer screening 10/03/2011    Allergic rhinitis 04/08/2008   HYPERCHOLESTEROLEMIA 01/09/2008    PCP: Clemens Curt, MD  REFERRING PROVIDER: Clemens Curt, MD  REFERRING DIAG:  Diagnosis  [phone] (ICD-10-CM) - Chronic pain of left knee  M79.661,M79.662 (ICD-10-CM) - Bilateral calf pain  M25.551 (ICD-10-CM) - Lateral pain of right hip   THERAPY DIAG:  Chronic pain of left knee  Pain in right hip  Joint stiffness of both hips  Muscle weakness (generalized)  Rationale for Evaluation and Treatment: Rehabilitation  ONSET DATE: 07/2023  SUBJECTIVE:   SUBJECTIVE STATEMENT: Pt. Known to PT clinic.  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  Pt. C/o 1-2/10 L knee pain with activity.  Pt. Reports a L knee injury while playing basketball with grandson's during Thanksgiving in 2024.  Pt. Reports a swaying gait, esp. With ascending/ descending stairs.  Pt. 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  Pt. States he was walking 8,000 steps/day and now is walking <6,000 steps/day.    PERTINENT HISTORY: Left knee pain - Primary     Acute after injury in November  That was followed by ruptured baker's cyst  Now continued discomfort and intermittent swelling  Interested in PT since hamstrings  and calves are very tight Gait has changed as well    Reviewed Dr Tasia Farr note from January  Reviewed LE US  from January    Today- xray of knee notes no osseous abn (reassuring)    Exam is reassuring -noted some tenderness in patellar tendon and patellofemoral areas    Ref to PT  Ice  Compression prn    Call back and Er precautions noted in detail today          Relevant Orders    DG Knee 4 Views W/Patella Left (Completed)    Ambulatory referral to Physical Therapy    Lateral pain of right hip    Suspect troch bursitis based on exam Gait has changed due to left knee problem    Ref to PT  Encouraged use of ice for this and knee        Relevant Orders    Ambulatory referral  to Physical Therapy    Bilateral calf pain    May be due to gait change from knee issue Also on statin Instructed to hold statin for 1 week just to see if it helps   Ref to PT        Relevant Orders    Ambulatory referral to Physical Therapy   PAIN:  Are you having pain? Yes: NPRS scale: 1-2/10 Pain location: L knee Pain description: aching Aggravating factors: increase activity Relieving factors: rest.   PRECAUTIONS: None  RED FLAGS: None   WEIGHT BEARING RESTRICTIONS: No  FALLS:  Has patient fallen in last 6 months? No  LIVING ENVIRONMENT: Lives with: lives with their spouse Lives in: House/apartment Stairs: Yes: Internal: 16 steps; on right going up Has following equipment at home: None  OCCUPATION: Futures trader  PLOF: Independent  PATIENT GOALS: Increase LE flexibility/ muscle strength to improve pain-free walking/ mobility.    NEXT MD VISIT: PRN  OBJECTIVE:  Note: Objective measures were completed at Evaluation unless otherwise noted.  DIAGNOSTIC FINDINGS: EXAM: LEFT KNEE - COMPLETE 4 VIEW   COMPARISON:  None Available.   FINDINGS: No fracture or dislocation. Preserved bone mineralization and joint spaces. No joint effusion. The right knee was included on the frontal view and is also grossly preserved. Dedicated right knee films could be performed as clinically appropriate.   IMPRESSION: No acute osseous abnormality.     Electronically Signed   By: Adrianna Horde M.D.   On: 12/05/2023 10:36  PATIENT SURVEYS:  LEFS 36 out of 80.  COGNITION: Overall cognitive status: Within functional limits for tasks assessed     SENSATION: WFL  EDEMA:  NT.  No visible swelling in knee joints.    MUSCLE LENGTH: Hamstrings: Right 68 deg; Left 70 deg Thomas test: NT  POSTURE: rounded shoulders, forward head, and increased thoracic kyphosis.   Discussed posture correction ex./ pec stretches.    PALPATION: Negative in knee/hip  LOWER EXTREMITY  ROM:  Active ROM Right eval Left eval  Hip flexion 124 deg. 124 deg.  Hip extension    Hip abduction    Hip adduction    Hip internal rotation 29 deg. 30 deg.  Hip external rotation 34 deg. 32 deg.  Knee flexion [facility]  Knee extension [facility]  Ankle dorsiflexion    Ankle plantarflexion    Ankle inversion    Ankle eversion     (Blank rows = not tested)  LOWER EXTREMITY MMT:  MMT Right eval Left eval  Hip flexion 4+ 4+  Hip extension  Hip abduction 4 4  Hip adduction    Hip internal rotation 4+ 4+  Hip external rotation 4+ 4+  Knee flexion 5 5  Knee extension 5 5  Ankle dorsiflexion    Ankle plantarflexion    Ankle inversion    Ankle eversion     (Blank rows = not tested)  LOWER EXTREMITY SPECIAL TESTS:  Hip special tests: Portia Brittle (FABER) test: positive  Knee special tests: Anterior drawer test: negative, Posterior drawer test: negative, and McMurray's test: negative.   Negative Thomas test.    FUNCTIONAL TESTS:  5 times sit to stand: TBD  GAIT: Distance walked: in clinic Assistive device utilized: None Level of assistance: Complete Independence Comments: Antalgic gait pattern with limited step length/ BOS.  Stiffness in B hips.                                                                              TREATMENT DATE: 12/25/2023  Subjective:  Pt. Reports no new complaints.  Pt. Compliant with HEP but completed fewer exercises over Easter weekend.   Manual tx.:  Supine LE stretches (focus on hamstring/ quad/ gastroc/ hip rotn./ piriformis)- 19 min.    Supine L/R LAD with gentle oscillations.    There.ex.:  Walking in hallway with posture correction.  Standing posture correction with moderate cuing.  Pt. States he is more aware of posture since starting PT and trying to correct more often during the day.    Nustep L4 B UE/LE for 10 min.  Discussed HEP.  Supine BTB hip flexion with BTB/ abduction with BTB/ bridging 20x each.    No changes to  HEP   PATIENT EDUCATION:  Education details: See HEP Person educated: Patient Education method: Explanation, Demonstration, and Handouts Education comprehension: verbalized understanding and returned demonstration  HOME EXERCISE PROGRAM: Access Code: [email or web address] URL: [email or web address] Date: 12/13/2023 Prepared by: Hazeline Lister  Exercises - Supine Lower Trunk Rotation  - 1 x daily - 5 x weekly - 1 sets - 10 reps - Supine Figure 4 Piriformis Stretch  - 1 x daily - 7 x weekly - 1 sets - 5 reps - Supine Hamstring Stretch  - 1 x daily - 7 x weekly - 1 sets - 5 reps - Supine Bridge  - 1 x daily - 5 x weekly - 2 sets - 10 reps - Seated Piriformis Stretch  - 1 x daily - 5 x weekly - 1 sets - 5 reps - Seated Figure 4 Piriformis Stretch  - 1 x daily - 5 x weekly - 1 sets - 5 reps - Doorway Pec Stretch at 90 Degrees Abduction  - 1 x daily - 5 x weekly - 1 sets - 5 reps - Standing Scapular Retraction with External Rotation  - 1 x daily - 5 x weekly - 2 sets - 10 reps  ASSESSMENT:  CLINICAL IMPRESSION: Pt. Presents with an increase in L/R hip IR and ER after manual stretches.  Pt. Ambulates with antalgic, stiff gait pattern with no assistive device and requires cuing to correction shoulder position/ upright posture.  No c/o hip/knee pain during supine LE ex.  Pt. Will benefit from skilled PT services to develop home LE stretching/  strengthening ex. Program to improve pain-free mobility.     OBJECTIVE IMPAIRMENTS: Abnormal gait, decreased activity tolerance, decreased balance, decreased mobility, difficulty walking, decreased ROM, decreased strength, hypomobility, impaired flexibility, improper body mechanics, postural dysfunction, and pain.   ACTIVITY LIMITATIONS: carrying, lifting, bending, standing, squatting, stairs, and locomotion level  PARTICIPATION LIMITATIONS: community activity  PERSONAL FACTORS: Fitness and Past/current experiences are also affecting patient's  functional outcome.   REHAB POTENTIAL: Good  CLINICAL DECISION MAKING: Evolving/moderate complexity  EVALUATION COMPLEXITY: Moderate   GOALS: Goals reviewed with patient? Yes  SHORT TERM GOALS: Target date: 01/03/24 Pt. Will be independent with HEP to increase B hip AROM (IR/ER) 50% to improve pain-free mobility.   Baseline: see aboce Goal status: INITIAL  LONG TERM GOALS: Target date: 02/07/24  Pt. Will increase LEFS to >50 out of 80 to improve pain-free mobility.   Baseline:  36 out of 80 Goal status: INITIAL  2.  Pt. Will report no L knee pain with yardwork/ daily household tasks.   Baseline: 2/10 L knee pain at rest. Goal status: INITIAL  3.  Pt. Able to ascend/descend 16 stairs at home with recip. Gait pattern and no increase c/o knee/hip pain.   Baseline: step to gait/ pain reported.  Goal status: INITIAL  4.  Pt. Able to return to walking >8,000 steps/day with no pain/limitations.   Baseline: <6,000 steps/day Goal status: INITIAL  PLAN:  PT FREQUENCY: 2x/week  PT DURATION: 8 weeks  PLANNED INTERVENTIONS: 97110-Therapeutic exercises, 97530- Therapeutic activity, [id number]- Neuromuscular re-education, 97535- Self Care, [id number]- Manual therapy, [phone]- Gait training, Balance training, Stair training, Joint mobilization, Cryotherapy, and Moist heat  PLAN FOR NEXT SESSION: Progress HEP/ measure hip IR/ER/ hamstring length.  Lendell Quarry, PT, DPT # [phone] 12/25/2023, 9:52 AM

## 2023-12-27 ENCOUNTER — Encounter: Payer: Self-pay | Admitting: Physical Therapy

## 2023-12-27 ENCOUNTER — Ambulatory Visit: Admitting: Physical Therapy

## 2023-12-27 DIAGNOSIS — G8929 Other chronic pain: Secondary | ICD-10-CM | POA: Diagnosis not present

## 2023-12-27 DIAGNOSIS — M25652 Stiffness of left hip, not elsewhere classified: Secondary | ICD-10-CM

## 2023-12-27 DIAGNOSIS — M6281 Muscle weakness (generalized): Secondary | ICD-10-CM | POA: Diagnosis not present

## 2023-12-27 DIAGNOSIS — M25551 Pain in right hip: Secondary | ICD-10-CM

## 2023-12-27 DIAGNOSIS — R269 Unspecified abnormalities of gait and mobility: Secondary | ICD-10-CM | POA: Diagnosis not present

## 2023-12-27 DIAGNOSIS — M25651 Stiffness of right hip, not elsewhere classified: Secondary | ICD-10-CM | POA: Diagnosis not present

## 2023-12-27 DIAGNOSIS — M79661 Pain in right lower leg: Secondary | ICD-10-CM | POA: Diagnosis not present

## 2023-12-27 DIAGNOSIS — M25562 Pain in left knee: Secondary | ICD-10-CM | POA: Diagnosis not present

## 2023-12-27 DIAGNOSIS — M79662 Pain in left lower leg: Secondary | ICD-10-CM | POA: Diagnosis not present

## 2023-12-27 NOTE — Therapy (Signed)
 OUTPATIENT PHYSICAL THERAPY LOWER EXTREMITY TREATMENT  Patient Name: Marvin Pacheco MRN: [id number] DOB:10-16-1945, 78 y.o., male Today's Date: 12/27/2023  END OF SESSION:  PT End of Session - 12/27/23 1251     Visit Number 5    Number of Visits 16    Date for PT Re-Evaluation 02/07/24    PT Start Time 1258    PT Stop Time 1347    PT Time Calculation (min) 49 min             Past Medical History:  Diagnosis Date   Aortic aneurysm (HCC)    4 cm- watched by cardiology per pt   Atrial flutter (HCC)    Basal cell carcinoma    on her face   Cataract    Hyperlipidemia    Meniere disease    Pseudophakia    Seasonal allergies    Sleep apnea    Past Surgical History:  Procedure Laterality Date   ABLATION  06/2019   Atrial fib flutter. REPEAT 2012   bilateral inguinal hernia  1990   CATARACT EXTRACTION     COLONOSCOPY     lipoma abdomen  2000   revision hernia     2000 and 2006. Revision of inguinal hernia   TOOTH EXTRACTION     Patient Active Problem List   Diagnosis Date Noted   Pain behind the ear, left 12/11/2023   Scalp pain 12/11/2023   Facial pain 12/11/2023   Left knee pain 12/05/2023   Bilateral calf pain 12/05/2023   Lateral pain of right hip 12/05/2023   Thrombocytopenia (HCC) 09/17/2023   COVID-19 10/09/2022   Essential hypertension 02/17/2021   Medicare annual wellness visit, subsequent 02/17/2021   Tachycardia 12/22/2019   Welcome to Medicare preventive visit 10/21/2018   BPH (benign prostatic hyperplasia) 10/21/2018   Snoring 08/21/2016   Prediabetes 07/08/2015   Aortic aneurysm (HCC) 07/14/2014   History of colonic polyps 09/18/2012   Colon cancer screening 05/22/2012   Meniere disease 01/15/2012   ANA positive 01/15/2012   Adverse effects of medication 01/15/2012   Vertigo 12/15/2011   Atrial flutter (HCC) 12/15/2011   Routine general medical examination at a health care facility 10/03/2011   Prostate cancer screening 10/03/2011    Allergic rhinitis 04/08/2008   HYPERCHOLESTEROLEMIA 01/09/2008    PCP: Clemens Curt, MD  REFERRING PROVIDER: Clemens Curt, MD  REFERRING DIAG:  Diagnosis  [phone] (ICD-10-CM) - Chronic pain of left knee  M79.661,M79.662 (ICD-10-CM) - Bilateral calf pain  M25.551 (ICD-10-CM) - Lateral pain of right hip   THERAPY DIAG:  Chronic pain of left knee  Pain in right hip  Joint stiffness of both hips  Muscle weakness (generalized)  Gait difficulty  Rationale for Evaluation and Treatment: Rehabilitation  ONSET DATE: 07/2023  SUBJECTIVE:   SUBJECTIVE STATEMENT: Pt. Known to PT clinic.  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  Pt. C/o 1-2/10 L knee pain with activity.  Pt. Reports a L knee injury while playing basketball with grandson's during Thanksgiving in 2024.  Pt. Reports a swaying gait, esp. With ascending/ descending stairs.  Pt. 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  Pt. States he was walking 8,000 steps/day and now is walking <6,000 steps/day.    PERTINENT HISTORY: Left knee pain - Primary     Acute after injury in November  That was followed by ruptured baker's cyst  Now continued discomfort and intermittent swelling  Interested in  PT since hamstrings and calves are very tight Gait has changed as well    Reviewed Dr Tasia Farr note from January  Reviewed LE US  from January    Today- xray of knee notes no osseous abn (reassuring)    Exam is reassuring -noted some tenderness in patellar tendon and patellofemoral areas    Ref to PT  Ice  Compression prn    Call back and Er precautions noted in detail today          Relevant Orders    DG Knee 4 Views W/Patella Left (Completed)    Ambulatory referral to Physical Therapy    Lateral pain of right hip    Suspect troch bursitis based on exam Gait has changed due to left knee problem    Ref to PT  Encouraged use of ice for this and knee        Relevant Orders     Ambulatory referral to Physical Therapy    Bilateral calf pain    May be due to gait change from knee issue Also on statin Instructed to hold statin for 1 week just to see if it helps   Ref to PT        Relevant Orders    Ambulatory referral to Physical Therapy   PAIN:  Are you having pain? Yes: NPRS scale: 1-2/10 Pain location: L knee Pain description: aching Aggravating factors: increase activity Relieving factors: rest.   PRECAUTIONS: None  RED FLAGS: None   WEIGHT BEARING RESTRICTIONS: No  FALLS:  Has patient fallen in last 6 months? No  LIVING ENVIRONMENT: Lives with: lives with their spouse Lives in: House/apartment Stairs: Yes: Internal: 16 steps; on right going up Has following equipment at home: None  OCCUPATION: Futures trader  PLOF: Independent  PATIENT GOALS: Increase LE flexibility/ muscle strength to improve pain-free walking/ mobility.    NEXT MD VISIT: PRN  OBJECTIVE:  Note: Objective measures were completed at Evaluation unless otherwise noted.  DIAGNOSTIC FINDINGS: EXAM: LEFT KNEE - COMPLETE 4 VIEW   COMPARISON:  None Available.   FINDINGS: No fracture or dislocation. Preserved bone mineralization and joint spaces. No joint effusion. The right knee was included on the frontal view and is also grossly preserved. Dedicated right knee films could be performed as clinically appropriate.   IMPRESSION: No acute osseous abnormality.     Electronically Signed   By: Adrianna Horde M.D.   On: 12/05/2023 10:36  PATIENT SURVEYS:  LEFS 36 out of 80.  COGNITION: Overall cognitive status: Within functional limits for tasks assessed     SENSATION: WFL  EDEMA:  NT.  No visible swelling in knee joints.    MUSCLE LENGTH: Hamstrings: Right 68 deg; Left 70 deg Thomas test: NT  POSTURE: rounded shoulders, forward head, and increased thoracic kyphosis.   Discussed posture correction ex./ pec stretches.    PALPATION: Negative in  knee/hip  LOWER EXTREMITY ROM:  Active ROM Right eval Left eval  Hip flexion 124 deg. 124 deg.  Hip extension    Hip abduction    Hip adduction    Hip internal rotation 29 deg. 30 deg.  Hip external rotation 34 deg. 32 deg.  Knee flexion [facility]  Knee extension [facility]  Ankle dorsiflexion    Ankle plantarflexion    Ankle inversion    Ankle eversion     (Blank rows = not tested)  LOWER EXTREMITY MMT:  MMT Right eval Left eval  Hip flexion 4+ 4+  Hip extension    Hip abduction 4 4  Hip adduction    Hip internal rotation 4+ 4+  Hip external rotation 4+ 4+  Knee flexion 5 5  Knee extension 5 5  Ankle dorsiflexion    Ankle plantarflexion    Ankle inversion    Ankle eversion     (Blank rows = not tested)  LOWER EXTREMITY SPECIAL TESTS:  Hip special tests: Portia Brittle (FABER) test: positive  Knee special tests: Anterior drawer test: negative, Posterior drawer test: negative, and McMurray's test: negative.   Negative Thomas test.    FUNCTIONAL TESTS:  5 times sit to stand: TBD  GAIT: Distance walked: in clinic Assistive device utilized: None Level of assistance: Complete Independence Comments: Antalgic gait pattern with limited step length/ BOS.  Stiffness in B hips.                                                                              TREATMENT DATE: 12/27/2023  Subjective:  Pt. Reports no new complaints.  Pt. Has been more compliant with HEP this week.    There.ex.:  Nustep L5 B UE/LE for 10 min.  Discussed HEP/ scapular retraction.    BTB scapular retraction 20x with mirror feedback.  PT provided tactile feedback to scapular/ rhomboid region for proper technique.   BTB shoulder diagonals 20x.  Mirror and Systems developer.    Supine 4# ankle wts.:  marching/ bicycles/ SLR 10x2.    Supine white ball ex.:  bridging/ knee to chest 10x2.  Supine hip abduction 10x each.    Manual tx.:  Supine LE stretches (focus on hamstring/ quad/ gastroc/ hip rotn./  piriformis)- 14 min.  Discussed proper technique supine trunk rotn.    Supine L/R LAD with gentle oscillations 3x each.      PATIENT EDUCATION:  Education details: See HEP Person educated: Patient Education method: Explanation, Demonstration, and Handouts Education comprehension: verbalized understanding and returned demonstration  HOME EXERCISE PROGRAM: Access Code: [email or web address] URL: [email or web address] Date: 12/13/2023 Prepared by: Hazeline Lister  Exercises - Supine Lower Trunk Rotation  - 1 x daily - 5 x weekly - 1 sets - 10 reps - Supine Figure 4 Piriformis Stretch  - 1 x daily - 7 x weekly - 1 sets - 5 reps - Supine Hamstring Stretch  - 1 x daily - 7 x weekly - 1 sets - 5 reps - Supine Bridge  - 1 x daily - 5 x weekly - 2 sets - 10 reps - Seated Piriformis Stretch  - 1 x daily - 5 x weekly - 1 sets - 5 reps - Seated Figure 4 Piriformis Stretch  - 1 x daily - 5 x weekly - 1 sets - 5 reps - Doorway Pec Stretch at 90 Degrees Abduction  - 1 x daily - 5 x weekly - 1 sets - 5 reps - Standing Scapular Retraction with External Rotation  - 1 x daily - 5 x weekly - 2 sets - 10 reps  ASSESSMENT:  CLINICAL IMPRESSION: Pt. Presents with an increase in L/R hamstring length/ hip IR and ER after manual stretches.  Pt. Ambulates with antalgic, stiff gait pattern with no assistive device and requires cuing for  posture correction/ arm swing.  No c/o hip/knee pain during supine LE ex.  Pt. Will benefit from skilled PT services to develop home LE stretching/ strengthening ex. Program to improve pain-free mobility.     OBJECTIVE IMPAIRMENTS: Abnormal gait, decreased activity tolerance, decreased balance, decreased mobility, difficulty walking, decreased ROM, decreased strength, hypomobility, impaired flexibility, improper body mechanics, postural dysfunction, and pain.   ACTIVITY LIMITATIONS: carrying, lifting, bending, standing, squatting, stairs, and locomotion level  PARTICIPATION  LIMITATIONS: community activity  PERSONAL FACTORS: Fitness and Past/current experiences are also affecting patient's functional outcome.   REHAB POTENTIAL: Good  CLINICAL DECISION MAKING: Evolving/moderate complexity  EVALUATION COMPLEXITY: Moderate   GOALS: Goals reviewed with patient? Yes  SHORT TERM GOALS: Target date: 01/03/24 Pt. Will be independent with HEP to increase B hip AROM (IR/ER) 50% to improve pain-free mobility.   Baseline: see aboce Goal status: INITIAL  LONG TERM GOALS: Target date: 02/07/24  Pt. Will increase LEFS to >50 out of 80 to improve pain-free mobility.   Baseline:  36 out of 80 Goal status: INITIAL  2.  Pt. Will report no L knee pain with yardwork/ daily household tasks.   Baseline: 2/10 L knee pain at rest. Goal status: INITIAL  3.  Pt. Able to ascend/descend 16 stairs at home with recip. Gait pattern and no increase c/o knee/hip pain.   Baseline: step to gait/ pain reported.  Goal status: INITIAL  4.  Pt. Able to return to walking >8,000 steps/day with no pain/limitations.   Baseline: <6,000 steps/day Goal status: INITIAL  PLAN:  PT FREQUENCY: 2x/week  PT DURATION: 8 weeks  PLANNED INTERVENTIONS: 97110-Therapeutic exercises, 97530- Therapeutic activity, [id number]- Neuromuscular re-education, 97535- Self Care, 16109- Manual therapy, [phone]- Gait training, Balance training, Stair training, Joint mobilization, Cryotherapy, and Moist heat  PLAN FOR NEXT SESSION: Check STGs  Lendell Quarry, PT, DPT # [phone] 12/27/2023, 8:21 PM

## 2024-01-01 ENCOUNTER — Ambulatory Visit: Admitting: Physical Therapy

## 2024-01-01 ENCOUNTER — Encounter: Payer: Self-pay | Admitting: Physical Therapy

## 2024-01-01 DIAGNOSIS — M25562 Pain in left knee: Secondary | ICD-10-CM | POA: Diagnosis not present

## 2024-01-01 DIAGNOSIS — M79661 Pain in right lower leg: Secondary | ICD-10-CM | POA: Diagnosis not present

## 2024-01-01 DIAGNOSIS — G8929 Other chronic pain: Secondary | ICD-10-CM | POA: Diagnosis not present

## 2024-01-01 DIAGNOSIS — M6281 Muscle weakness (generalized): Secondary | ICD-10-CM

## 2024-01-01 DIAGNOSIS — M25551 Pain in right hip: Secondary | ICD-10-CM | POA: Diagnosis not present

## 2024-01-01 DIAGNOSIS — R269 Unspecified abnormalities of gait and mobility: Secondary | ICD-10-CM | POA: Diagnosis not present

## 2024-01-01 DIAGNOSIS — M25651 Stiffness of right hip, not elsewhere classified: Secondary | ICD-10-CM | POA: Diagnosis not present

## 2024-01-01 DIAGNOSIS — M25652 Stiffness of left hip, not elsewhere classified: Secondary | ICD-10-CM | POA: Diagnosis not present

## 2024-01-01 DIAGNOSIS — M79662 Pain in left lower leg: Secondary | ICD-10-CM | POA: Diagnosis not present

## 2024-01-01 NOTE — Therapy (Signed)
 OUTPATIENT PHYSICAL THERAPY LOWER EXTREMITY TREATMENT  Patient Name: Marvin Pacheco MRN: [id number] DOB:03-17-1946, 78 y.o., male Today's Date: 01/01/2024  END OF SESSION:  PT End of Session - 01/01/24 2014     Visit Number 6    Number of Visits 16    Date for PT Re-Evaluation 02/07/24    PT Start Time 0939    PT Stop Time 1028    PT Time Calculation (min) 49 min             Past Medical History:  Diagnosis Date   Aortic aneurysm (HCC)    4 cm- watched by cardiology per pt   Atrial flutter (HCC)    Basal cell carcinoma    on her face   Cataract    Hyperlipidemia    Meniere disease    Pseudophakia    Seasonal allergies    Sleep apnea    Past Surgical History:  Procedure Laterality Date   ABLATION  06/2019   Atrial fib flutter. REPEAT 2012   bilateral inguinal hernia  1990   CATARACT EXTRACTION     COLONOSCOPY     lipoma abdomen  2000   revision hernia     2000 and 2006. Revision of inguinal hernia   TOOTH EXTRACTION     Patient Active Problem List   Diagnosis Date Noted   Pain behind the ear, left 12/11/2023   Scalp pain 12/11/2023   Facial pain 12/11/2023   Left knee pain 12/05/2023   Bilateral calf pain 12/05/2023   Lateral pain of right hip 12/05/2023   Thrombocytopenia (HCC) 09/17/2023   COVID-19 10/09/2022   Essential hypertension 02/17/2021   Medicare annual wellness visit, subsequent 02/17/2021   Tachycardia 12/22/2019   Welcome to Medicare preventive visit 10/21/2018   BPH (benign prostatic hyperplasia) 10/21/2018   Snoring 08/21/2016   Prediabetes 07/08/2015   Aortic aneurysm (HCC) 07/14/2014   History of colonic polyps 09/18/2012   Colon cancer screening 05/22/2012   Meniere disease 01/15/2012   ANA positive 01/15/2012   Adverse effects of medication 01/15/2012   Vertigo 12/15/2011   Atrial flutter (HCC) 12/15/2011   Routine general medical examination at a health care facility 10/03/2011   Prostate cancer screening 10/03/2011    Allergic rhinitis 04/08/2008   HYPERCHOLESTEROLEMIA 01/09/2008    PCP: Clemens Curt, MD  REFERRING PROVIDER: Clemens Curt, MD  REFERRING DIAG:  Diagnosis  [phone] (ICD-10-CM) - Chronic pain of left knee  M79.661,M79.662 (ICD-10-CM) - Bilateral calf pain  M25.551 (ICD-10-CM) - Lateral pain of right hip   THERAPY DIAG:  Chronic pain of left knee  Pain in right hip  Joint stiffness of both hips  Muscle weakness (generalized)  Gait difficulty  Rationale for Evaluation and Treatment: Rehabilitation  ONSET DATE: 07/2023  SUBJECTIVE:   SUBJECTIVE STATEMENT: Pt. Known to PT clinic.  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  Pt. C/o 1-2/10 L knee pain with activity.  Pt. Reports a L knee injury while playing basketball with grandson's during Thanksgiving in 2024.  Pt. Reports a swaying gait, esp. With ascending/ descending stairs.  Pt. 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  Pt. States he was walking 8,000 steps/day and now is walking <6,000 steps/day.    PERTINENT HISTORY: Left knee pain - Primary     Acute after injury in November  That was followed by ruptured baker's cyst  Now continued discomfort and intermittent swelling  Interested in  PT since hamstrings and calves are very tight Gait has changed as well    Reviewed Dr Tasia Farr note from January  Reviewed LE US  from January    Today- xray of knee notes no osseous abn (reassuring)    Exam is reassuring -noted some tenderness in patellar tendon and patellofemoral areas    Ref to PT  Ice  Compression prn    Call back and Er precautions noted in detail today          Relevant Orders    DG Knee 4 Views W/Patella Left (Completed)    Ambulatory referral to Physical Therapy    Lateral pain of right hip    Suspect troch bursitis based on exam Gait has changed due to left knee problem    Ref to PT  Encouraged use of ice for this and knee        Relevant Orders     Ambulatory referral to Physical Therapy    Bilateral calf pain    May be due to gait change from knee issue Also on statin Instructed to hold statin for 1 week just to see if it helps   Ref to PT        Relevant Orders    Ambulatory referral to Physical Therapy   PAIN:  Are you having pain? Yes: NPRS scale: 1-2/10 Pain location: L knee Pain description: aching Aggravating factors: increase activity Relieving factors: rest.   PRECAUTIONS: None  RED FLAGS: None   WEIGHT BEARING RESTRICTIONS: No  FALLS:  Has patient fallen in last 6 months? No  LIVING ENVIRONMENT: Lives with: lives with their spouse Lives in: House/apartment Stairs: Yes: Internal: 16 steps; on right going up Has following equipment at home: None  OCCUPATION: Futures trader  PLOF: Independent  PATIENT GOALS: Increase LE flexibility/ muscle strength to improve pain-free walking/ mobility.    NEXT MD VISIT: PRN  OBJECTIVE:  Note: Objective measures were completed at Evaluation unless otherwise noted.  DIAGNOSTIC FINDINGS: EXAM: LEFT KNEE - COMPLETE 4 VIEW   COMPARISON:  None Available.   FINDINGS: No fracture or dislocation. Preserved bone mineralization and joint spaces. No joint effusion. The right knee was included on the frontal view and is also grossly preserved. Dedicated right knee films could be performed as clinically appropriate.   IMPRESSION: No acute osseous abnormality.     Electronically Signed   By: Adrianna Horde M.D.   On: 12/05/2023 10:36  PATIENT SURVEYS:  LEFS 36 out of 80.  COGNITION: Overall cognitive status: Within functional limits for tasks assessed     SENSATION: WFL  EDEMA:  NT.  No visible swelling in knee joints.    MUSCLE LENGTH: Hamstrings: Right 68 deg; Left 70 deg Thomas test: NT  POSTURE: rounded shoulders, forward head, and increased thoracic kyphosis.   Discussed posture correction ex./ pec stretches.    PALPATION: Negative in  knee/hip  LOWER EXTREMITY ROM:  Active ROM Right eval Left eval  Hip flexion 124 deg. 124 deg.  Hip extension    Hip abduction    Hip adduction    Hip internal rotation 29 deg. 30 deg.  Hip external rotation 34 deg. 32 deg.  Knee flexion [facility]  Knee extension [facility]  Ankle dorsiflexion    Ankle plantarflexion    Ankle inversion    Ankle eversion     (Blank rows = not tested)  LOWER EXTREMITY MMT:  MMT Right eval Left eval  Hip flexion 4+ 4+  Hip extension    Hip abduction 4 4  Hip adduction    Hip internal rotation 4+ 4+  Hip external rotation 4+ 4+  Knee flexion 5 5  Knee extension 5 5  Ankle dorsiflexion    Ankle plantarflexion    Ankle inversion    Ankle eversion     (Blank rows = not tested)  LOWER EXTREMITY SPECIAL TESTS:  Hip special tests: Portia Brittle (FABER) test: positive  Knee special tests: Anterior drawer test: negative, Posterior drawer test: negative, and McMurray's test: negative.   Negative Thomas test.    FUNCTIONAL TESTS:  5 times sit to stand: TBD  GAIT: Distance walked: in clinic Assistive device utilized: None Level of assistance: Complete Independence Comments: Antalgic gait pattern with limited step length/ BOS.  Stiffness in B hips.                                                                              TREATMENT DATE: 01/01/2024  Subjective:  Pt. Reports he had an episode of L knee buckling while standing up and initiating walking at home.  Pt. Able to prevent fall by holding on to object.  No pain reported at this time.  Pt. Has been walking 8,000 steps/ day over past 3 days.    There.ex.:  Nustep L5 B UE/LE for 10 min. (0.52 miles).  Discussed HEP/ recent L knee buckling.    Walking in hallway with consistent cadence.  Minimal cuing to correct upright posture/ arm swing.    TG knee flexion (90 deg.)/ heel raises 20x2 each.  Resisted gait 2BTB 8x all 4-planes.  Controlled movement pattern with focus on LE stability/ recip.  Step pattern.    Manual tx.:  Supine LE stretches (focus on hamstring/ quad/ gastroc/ hip rotn./ piriformis)- 14 min.  Discussed proper technique supine trunk rotn.    Supine L/R LAD with gentle oscillations 3x each.      Not Today: BTB scapular retraction 20x with mirror feedback.  PT provided tactile feedback to scapular/ rhomboid region for proper technique.   BTB shoulder diagonals 20x.  Mirror and Systems developer.    Supine 4# ankle wts.:  marching/ bicycles/ SLR 10x2.    Supine white ball ex.:  bridging/ knee to chest 10x2.  Supine hip abduction 10x each.     PATIENT EDUCATION:  Education details: See HEP Person educated: Patient Education method: Explanation, Demonstration, and Handouts Education comprehension: verbalized understanding and returned demonstration  HOME EXERCISE PROGRAM: Access Code: [email or web address] URL: [email or web address] Date: 12/13/2023 Prepared by: Hazeline Lister  Exercises - Supine Lower Trunk Rotation  - 1 x daily - 5 x weekly - 1 sets - 10 reps - Supine Figure 4 Piriformis Stretch  - 1 x daily - 7 x weekly - 1 sets - 5 reps - Supine Hamstring Stretch  - 1 x daily - 7 x weekly - 1 sets - 5 reps - Supine Bridge  - 1 x daily - 5 x weekly - 2 sets - 10 reps - Seated Piriformis Stretch  - 1 x daily - 5 x weekly - 1 sets - 5 reps - Seated Figure 4 Piriformis Stretch  - 1 x daily - 5 x  weekly - 1 sets - 5 reps - Doorway Pec Stretch at 90 Degrees Abduction  - 1 x daily - 5 x weekly - 1 sets - 5 reps - Standing Scapular Retraction with External Rotation  - 1 x daily - 5 x weekly - 2 sets - 10 reps  ASSESSMENT:  CLINICAL IMPRESSION: Pt. Presents with an increase in L/R hamstring length/ hip IR and ER after manual stretches.  Pt. Ambulates with more consistent recip. Gait pattern with no assistive device but requires cuing for posture correction/ arm swing.  No c/o hip/knee pain during supine LE ex.  Pt. Will benefit from skilled PT services to  develop home LE stretching/ strengthening ex. Program to improve pain-free mobility.     OBJECTIVE IMPAIRMENTS: Abnormal gait, decreased activity tolerance, decreased balance, decreased mobility, difficulty walking, decreased ROM, decreased strength, hypomobility, impaired flexibility, improper body mechanics, postural dysfunction, and pain.   ACTIVITY LIMITATIONS: carrying, lifting, bending, standing, squatting, stairs, and locomotion level  PARTICIPATION LIMITATIONS: community activity  PERSONAL FACTORS: Fitness and Past/current experiences are also affecting patient's functional outcome.   REHAB POTENTIAL: Good  CLINICAL DECISION MAKING: Evolving/moderate complexity  EVALUATION COMPLEXITY: Moderate   GOALS: Goals reviewed with patient? Yes  SHORT TERM GOALS: Target date: 01/03/24 Pt. Will be independent with HEP to increase B hip AROM (IR/ER) 50% to improve pain-free mobility.   Baseline: see aboce Goal status: INITIAL  LONG TERM GOALS: Target date: 02/07/24  Pt. Will increase LEFS to >50 out of 80 to improve pain-free mobility.   Baseline:  36 out of 80 Goal status: INITIAL  2.  Pt. Will report no L knee pain with yardwork/ daily household tasks.   Baseline: 2/10 L knee pain at rest. Goal status: INITIAL  3.  Pt. Able to ascend/descend 16 stairs at home with recip. Gait pattern and no increase c/o knee/hip pain.   Baseline: step to gait/ pain reported.  Goal status: INITIAL  4.  Pt. Able to return to walking >8,000 steps/day with no pain/limitations.   Baseline: <6,000 steps/day Goal status: INITIAL  PLAN:  PT FREQUENCY: 2x/week  PT DURATION: 8 weeks  PLANNED INTERVENTIONS: 97110-Therapeutic exercises, 97530- Therapeutic activity, [id number]- Neuromuscular re-education, 97535- Self Care, 16109- Manual therapy, [phone]- Gait training, Balance training, Stair training, Joint mobilization, Cryotherapy, and Moist heat  PLAN FOR NEXT SESSION: Check STGs  Lendell Quarry, PT,  DPT # [phone] 01/01/2024, 8:15 PM

## 2024-01-04 DIAGNOSIS — H903 Sensorineural hearing loss, bilateral: Secondary | ICD-10-CM | POA: Diagnosis not present

## 2024-01-04 DIAGNOSIS — H8102 Meniere's disease, left ear: Secondary | ICD-10-CM | POA: Diagnosis not present

## 2024-01-04 DIAGNOSIS — H9312 Tinnitus, left ear: Secondary | ICD-10-CM | POA: Diagnosis not present

## 2024-01-08 ENCOUNTER — Ambulatory Visit: Attending: Family Medicine | Admitting: Physical Therapy

## 2024-01-08 ENCOUNTER — Encounter: Payer: Self-pay | Admitting: Physical Therapy

## 2024-01-08 DIAGNOSIS — M6281 Muscle weakness (generalized): Secondary | ICD-10-CM

## 2024-01-08 DIAGNOSIS — M25652 Stiffness of left hip, not elsewhere classified: Secondary | ICD-10-CM | POA: Insufficient documentation

## 2024-01-08 DIAGNOSIS — M25562 Pain in left knee: Secondary | ICD-10-CM | POA: Diagnosis not present

## 2024-01-08 DIAGNOSIS — G8929 Other chronic pain: Secondary | ICD-10-CM

## 2024-01-08 DIAGNOSIS — M25651 Stiffness of right hip, not elsewhere classified: Secondary | ICD-10-CM

## 2024-01-08 DIAGNOSIS — M25551 Pain in right hip: Secondary | ICD-10-CM

## 2024-01-08 DIAGNOSIS — R269 Unspecified abnormalities of gait and mobility: Secondary | ICD-10-CM

## 2024-01-08 NOTE — Therapy (Signed)
 OUTPATIENT PHYSICAL THERAPY LOWER EXTREMITY TREATMENT  Patient Name: Marvin Pacheco MRN: [id number] DOB:[birth year], 78 y.o., male Today's Date: 01/08/2024  END OF SESSION:  PT End of Session - 01/08/24 0936     Visit Number 7    Number of Visits 16    Date for PT Re-Evaluation 02/07/24    PT Start Time 0936    PT Stop Time 1025    PT Time Calculation (min) 49 min             Past Medical History:  Diagnosis Date   Aortic aneurysm (HCC)    4 cm- watched by cardiology per pt   Atrial flutter (HCC)    Basal cell carcinoma    on her face   Cataract    Hyperlipidemia    Meniere disease    Pseudophakia    Seasonal allergies    Sleep apnea    Past Surgical History:  Procedure Laterality Date   ABLATION  06/2019   Atrial fib flutter. REPEAT 2012   bilateral inguinal hernia  1990   CATARACT EXTRACTION     COLONOSCOPY     lipoma abdomen  2000   revision hernia     2000 and 2006. Revision of inguinal hernia   TOOTH EXTRACTION     Patient Active Problem List   Diagnosis Date Noted   Pain behind the ear, left 12/11/2023   Scalp pain 12/11/2023   Facial pain 12/11/2023   Left knee pain 12/05/2023   Bilateral calf pain 12/05/2023   Lateral pain of right hip 12/05/2023   Thrombocytopenia (HCC) 09/17/2023   COVID-19 10/09/2022   Essential hypertension 02/17/2021   Medicare annual wellness visit, subsequent 02/17/2021   Tachycardia 12/22/2019   Welcome to Medicare preventive visit 10/21/2018   BPH (benign prostatic hyperplasia) 10/21/2018   Snoring 08/21/2016   Prediabetes 07/08/2015   Aortic aneurysm (HCC) 07/14/2014   History of colonic polyps 09/18/2012   Colon cancer screening 05/22/2012   Meniere disease 01/15/2012   ANA positive 01/15/2012   Adverse effects of medication 01/15/2012   Vertigo 12/15/2011   Atrial flutter (HCC) 12/15/2011   Routine general medical examination at a health care facility 10/03/2011   Prostate cancer screening 10/03/2011    Allergic rhinitis 04/08/2008   HYPERCHOLESTEROLEMIA 01/09/2008    PCP: Clemens Curt, MD  REFERRING PROVIDER: Clemens Curt, MD  REFERRING DIAG:  Diagnosis  [phone] (ICD-10-CM) - Chronic pain of left knee  M79.661,M79.662 (ICD-10-CM) - Bilateral calf pain  M25.551 (ICD-10-CM) - Lateral pain of right hip   THERAPY DIAG:  Chronic pain of left knee  Pain in right hip  Joint stiffness of both hips  Muscle weakness (generalized)  Gait difficulty  Rationale for Evaluation and Treatment: Rehabilitation  ONSET DATE: 07/2023  SUBJECTIVE:   SUBJECTIVE STATEMENT: Pt. Known to PT clinic.  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  Pt. C/o 1-2/10 L knee pain with activity.  Pt. Reports a L knee injury while playing basketball with grandson's during Thanksgiving in 2024.  Pt. Reports a swaying gait, esp. With ascending/ descending stairs.  Pt. 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  Pt. States he was walking 8,000 steps/day and now is walking <6,000 steps/day.    PERTINENT HISTORY: Left knee pain - Primary     Acute after injury in November  That was followed by ruptured baker's cyst  Now continued discomfort and intermittent swelling  Interested in  PT since hamstrings and calves are very tight Gait has changed as well    Reviewed Dr Tasia Farr note from January  Reviewed LE US  from January    Today- xray of knee notes no osseous abn (reassuring)    Exam is reassuring -noted some tenderness in patellar tendon and patellofemoral areas    Ref to PT  Ice  Compression prn    Call back and Er precautions noted in detail today          Relevant Orders    DG Knee 4 Views W/Patella Left (Completed)    Ambulatory referral to Physical Therapy    Lateral pain of right hip    Suspect troch bursitis based on exam Gait has changed due to left knee problem    Ref to PT  Encouraged use of ice for this and knee        Relevant Orders     Ambulatory referral to Physical Therapy    Bilateral calf pain    May be due to gait change from knee issue Also on statin Instructed to hold statin for 1 week just to see if it helps   Ref to PT        Relevant Orders    Ambulatory referral to Physical Therapy   PAIN:  Are you having pain? Yes: NPRS scale: 1-2/10 Pain location: L knee Pain description: aching Aggravating factors: increase activity Relieving factors: rest.   PRECAUTIONS: None  RED FLAGS: None   WEIGHT BEARING RESTRICTIONS: No  FALLS:  Has patient fallen in last 6 months? No  LIVING ENVIRONMENT: Lives with: lives with their spouse Lives in: House/apartment Stairs: Yes: Internal: 16 steps; on right going up Has following equipment at home: None  OCCUPATION: Futures trader  PLOF: Independent  PATIENT GOALS: Increase LE flexibility/ muscle strength to improve pain-free walking/ mobility.    NEXT MD VISIT: PRN  OBJECTIVE:  Note: Objective measures were completed at Evaluation unless otherwise noted.  DIAGNOSTIC FINDINGS: EXAM: LEFT KNEE - COMPLETE 4 VIEW   COMPARISON:  None Available.   FINDINGS: No fracture or dislocation. Preserved bone mineralization and joint spaces. No joint effusion. The right knee was included on the frontal view and is also grossly preserved. Dedicated right knee films could be performed as clinically appropriate.   IMPRESSION: No acute osseous abnormality.     Electronically Signed   By: Adrianna Horde M.D.   On: 12/05/2023 10:36  PATIENT SURVEYS:  LEFS 36 out of 80.  COGNITION: Overall cognitive status: Within functional limits for tasks assessed     SENSATION: WFL  EDEMA:  NT.  No visible swelling in knee joints.    MUSCLE LENGTH: Hamstrings: Right 68 deg; Left 70 deg Thomas test: NT  POSTURE: rounded shoulders, forward head, and increased thoracic kyphosis.   Discussed posture correction ex./ pec stretches.    PALPATION: Negative in  knee/hip  LOWER EXTREMITY ROM:  Active ROM Right eval Left eval  Hip flexion 124 deg. 124 deg.  Hip extension    Hip abduction    Hip adduction    Hip internal rotation 29 deg. 30 deg.  Hip external rotation 34 deg. 32 deg.  Knee flexion [facility]  Knee extension [facility]  Ankle dorsiflexion    Ankle plantarflexion    Ankle inversion    Ankle eversion     (Blank rows = not tested)  LOWER EXTREMITY MMT:  MMT Right eval Left eval  Hip flexion 4+ 4+  Hip extension    Hip abduction 4 4  Hip adduction    Hip internal rotation 4+ 4+  Hip external rotation 4+ 4+  Knee flexion 5 5  Knee extension 5 5  Ankle dorsiflexion    Ankle plantarflexion    Ankle inversion    Ankle eversion     (Blank rows = not tested)  LOWER EXTREMITY SPECIAL TESTS:  Hip special tests: Marvin Pacheco (FABER) test: positive  Knee special tests: Anterior drawer test: negative, Posterior drawer test: negative, and McMurray's test: negative.   Negative Thomas test.    FUNCTIONAL TESTS:  5 times sit to stand: TBD  GAIT: Distance walked: in clinic Assistive device utilized: None Level of assistance: Complete Independence Comments: Antalgic gait pattern with limited step length/ BOS.  Stiffness in B hips.                                                                              TREATMENT DATE: 01/08/2024  Subjective:  Pt. Reports he is doing better.  No L knee pain reported and pt. States he is aware of posture with everyday tasks.    There.ex.:  Nustep L5 B LE only (used B UE for last several minutes) for 10 min. (0.57 miles).  Discussed HEP/ upright posture/ head position.   Discussed posture/ chin tuck with Civil engineer, contracting.    Supine white ball trunk rotn/ bridging/ SLR  TG knee flexion (90 deg.)/ heel raises 20x2 each.  Walking in hallway with consistent cadence.  Minimal cuing to correct upright posture/ arm swing.    Resisted gait at Nautilus: 80# 7x all 4-planes.  Controlled movement  pattern with focus on LE stability/ recip. Step pattern.    Supine white ball ex.:  bridging/ trunk rotn./ SLR 20x.    Manual tx.:  Supine LE stretches (focus on hamstring/ quad/ gastroc/ hip rotn./ piriformis)- 10 min.  Discussed proper technique supine trunk rotn.    Supine L/R LAD with gentle oscillations 3x each.      Not Today: BTB scapular retraction 20x with mirror feedback.  PT provided tactile feedback to scapular/ rhomboid region for proper technique.   BTB shoulder diagonals 20x.  Mirror and Systems developer.    Supine 4# ankle wts.:  marching/ bicycles/ SLR 10x2.     PATIENT EDUCATION:  Education details: See HEP Person educated: Patient Education method: Explanation, Demonstration, and Handouts Education comprehension: verbalized understanding and returned demonstration  HOME EXERCISE PROGRAM: Access Code: [email or web address] URL: [email or web address] Date: 12/13/2023 Prepared by: Hazeline Lister  Exercises - Supine Lower Trunk Rotation  - 1 x daily - 5 x weekly - 1 sets - 10 reps - Supine Figure 4 Piriformis Stretch  - 1 x daily - 7 x weekly - 1 sets - 5 reps - Supine Hamstring Stretch  - 1 x daily - 7 x weekly - 1 sets - 5 reps - Supine Bridge  - 1 x daily - 5 x weekly - 2 sets - 10 reps - Seated Piriformis Stretch  - 1 x daily - 5 x weekly - 1 sets - 5 reps - Seated Figure 4 Piriformis Stretch  - 1 x daily - 5 x weekly - 1  sets - 5 reps - Doorway Pec Stretch at 90 Degrees Abduction  - 1 x daily - 5 x weekly - 1 sets - 5 reps - Standing Scapular Retraction with External Rotation  - 1 x daily - 5 x weekly - 2 sets - 10 reps  ASSESSMENT:  CLINICAL IMPRESSION: Pt. Presents with marked increase in L/R hamstring length/ hip IR and ER after manual stretches.  Pt. Ambulates with more consistent recip. Gait pattern with no assistive device but requires cuing for posture correction/ arm swing.  No c/o hip/knee pain during supine LE ex.  Moderate cuing for proper chin  tucks with mirror/manual feedback.  Pt. Will benefit from skilled PT services to develop home LE stretching/ strengthening ex. Program to improve pain-free mobility.     OBJECTIVE IMPAIRMENTS: Abnormal gait, decreased activity tolerance, decreased balance, decreased mobility, difficulty walking, decreased ROM, decreased strength, hypomobility, impaired flexibility, improper body mechanics, postural dysfunction, and pain.   ACTIVITY LIMITATIONS: carrying, lifting, bending, standing, squatting, stairs, and locomotion level  PARTICIPATION LIMITATIONS: community activity  PERSONAL FACTORS: Fitness and Past/current experiences are also affecting patient's functional outcome.   REHAB POTENTIAL: Good  CLINICAL DECISION MAKING: Evolving/moderate complexity  EVALUATION COMPLEXITY: Moderate   GOALS: Goals reviewed with patient? Yes  SHORT TERM GOALS: Target date: 01/03/24 Pt. Will be independent with HEP to increase B hip AROM (IR/ER) 50% to improve pain-free mobility.   Baseline: see aboce Goal status: Goal met  LONG TERM GOALS: Target date: 02/07/24  Pt. Will increase LEFS to >50 out of 80 to improve pain-free mobility.   Baseline:  36 out of 80 Goal status: INITIAL  2.  Pt. Will report no L knee pain with yardwork/ daily household tasks.   Baseline: 2/10 L knee pain at rest. Goal status: Goal met  3.  Pt. Able to ascend/descend 16 stairs at home with recip. Gait pattern and no increase c/o knee/hip pain.   Baseline: step to gait/ pain reported.  Goal status: INITIAL  4.  Pt. Able to return to walking >8,000 steps/day with no pain/limitations.   Baseline: <6,000 steps/day.  5/6: increase steps/day reported.   Goal status: Partially met  PLAN:  PT FREQUENCY: 2x/week  PT DURATION: 8 weeks  PLANNED INTERVENTIONS: 97110-Therapeutic exercises, 97530- Therapeutic activity, [id number]- Neuromuscular re-education, 97535- Self Care, 11914- Manual therapy, [phone]- Gait training, Balance  training, Stair training, Joint mobilization, Cryotherapy, and Moist heat  PLAN FOR NEXT SESSION: Check LEFS  Lendell Quarry, PT, DPT # [phone] 01/08/2024, 12:30 PM

## 2024-01-10 ENCOUNTER — Ambulatory Visit: Admitting: Physical Therapy

## 2024-01-10 ENCOUNTER — Encounter: Payer: Self-pay | Admitting: Physical Therapy

## 2024-01-10 DIAGNOSIS — M25551 Pain in right hip: Secondary | ICD-10-CM

## 2024-01-10 DIAGNOSIS — M25562 Pain in left knee: Secondary | ICD-10-CM | POA: Diagnosis not present

## 2024-01-10 DIAGNOSIS — G8929 Other chronic pain: Secondary | ICD-10-CM

## 2024-01-10 DIAGNOSIS — M25651 Stiffness of right hip, not elsewhere classified: Secondary | ICD-10-CM | POA: Diagnosis not present

## 2024-01-10 DIAGNOSIS — R269 Unspecified abnormalities of gait and mobility: Secondary | ICD-10-CM | POA: Diagnosis not present

## 2024-01-10 DIAGNOSIS — M25652 Stiffness of left hip, not elsewhere classified: Secondary | ICD-10-CM | POA: Diagnosis not present

## 2024-01-10 DIAGNOSIS — M6281 Muscle weakness (generalized): Secondary | ICD-10-CM | POA: Diagnosis not present

## 2024-01-10 NOTE — Therapy (Signed)
 OUTPATIENT PHYSICAL THERAPY LOWER EXTREMITY TREATMENT  Patient Name: Marvin Pacheco MRN: [id number] DOB:June 16, 1946, 78 y.o., male Today's Date: 01/10/2024  END OF SESSION:  PT End of Session - 01/10/24 0945     Visit Number 8    Number of Visits 16    Date for PT Re-Evaluation 02/07/24    PT Start Time 0945    PT Stop Time 1032    PT Time Calculation (min) 47 min             Past Medical History:  Diagnosis Date   Aortic aneurysm (HCC)    4 cm- watched by cardiology per pt   Atrial flutter (HCC)    Basal cell carcinoma    on her face   Cataract    Hyperlipidemia    Meniere disease    Pseudophakia    Seasonal allergies    Sleep apnea    Past Surgical History:  Procedure Laterality Date   ABLATION  06/2019   Atrial fib flutter. REPEAT 2012   bilateral inguinal hernia  1990   CATARACT EXTRACTION     COLONOSCOPY     lipoma abdomen  2000   revision hernia     2000 and 2006. Revision of inguinal hernia   TOOTH EXTRACTION     Patient Active Problem List   Diagnosis Date Noted   Pain behind the ear, left 12/11/2023   Scalp pain 12/11/2023   Facial pain 12/11/2023   Left knee pain 12/05/2023   Bilateral calf pain 12/05/2023   Lateral pain of right hip 12/05/2023   Thrombocytopenia (HCC) 09/17/2023   COVID-19 10/09/2022   Essential hypertension 02/17/2021   Medicare annual wellness visit, subsequent 02/17/2021   Tachycardia 12/22/2019   Welcome to Medicare preventive visit 10/21/2018   BPH (benign prostatic hyperplasia) 10/21/2018   Snoring 08/21/2016   Prediabetes 07/08/2015   Aortic aneurysm (HCC) 07/14/2014   History of colonic polyps 09/18/2012   Colon cancer screening 05/22/2012   Meniere disease 01/15/2012   ANA positive 01/15/2012   Adverse effects of medication 01/15/2012   Vertigo 12/15/2011   Atrial flutter (HCC) 12/15/2011   Routine general medical examination at a health care facility 10/03/2011   Prostate cancer screening 10/03/2011    Allergic rhinitis 04/08/2008   HYPERCHOLESTEROLEMIA 01/09/2008    PCP: Clemens Curt, MD  REFERRING PROVIDER: Clemens Curt, MD  REFERRING DIAG:  Diagnosis  [phone] (ICD-10-CM) - Chronic pain of left knee  M79.661,M79.662 (ICD-10-CM) - Bilateral calf pain  M25.551 (ICD-10-CM) - Lateral pain of right hip   THERAPY DIAG:  Chronic pain of left knee  Pain in right hip  Joint stiffness of both hips  Muscle weakness (generalized)  Gait difficulty  Rationale for Evaluation and Treatment: Rehabilitation  ONSET DATE: 07/2023  SUBJECTIVE:   SUBJECTIVE STATEMENT: Pt. Known to PT clinic.  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  Pt. C/o 1-2/10 L knee pain with activity.  Pt. Reports a L knee injury while playing basketball with grandson's during Thanksgiving in 2024.  Pt. Reports a swaying gait, esp. With ascending/ descending stairs.  Pt. 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  Pt. States he was walking 8,000 steps/day and now is walking <6,000 steps/day.    PERTINENT HISTORY: Left knee pain - Primary     Acute after injury in November  That was followed by ruptured baker's cyst  Now continued discomfort and intermittent swelling  Interested in  PT since hamstrings and calves are very tight Gait has changed as well    Reviewed Dr Tasia Farr note from January  Reviewed LE US  from January    Today- xray of knee notes no osseous abn (reassuring)    Exam is reassuring -noted some tenderness in patellar tendon and patellofemoral areas    Ref to PT  Ice  Compression prn    Call back and Er precautions noted in detail today          Relevant Orders    DG Knee 4 Views W/Patella Left (Completed)    Ambulatory referral to Physical Therapy    Lateral pain of right hip    Suspect troch bursitis based on exam Gait has changed due to left knee problem    Ref to PT  Encouraged use of ice for this and knee        Relevant Orders     Ambulatory referral to Physical Therapy    Bilateral calf pain    May be due to gait change from knee issue Also on statin Instructed to hold statin for 1 week just to see if it helps   Ref to PT        Relevant Orders    Ambulatory referral to Physical Therapy   PAIN:  Are you having pain? Yes: NPRS scale: 1-2/10 Pain location: L knee Pain description: aching Aggravating factors: increase activity Relieving factors: rest.   PRECAUTIONS: None  RED FLAGS: None   WEIGHT BEARING RESTRICTIONS: No  FALLS:  Has patient fallen in last 6 months? No  LIVING ENVIRONMENT: Lives with: lives with their spouse Lives in: House/apartment Stairs: Yes: Internal: 16 steps; on right going up Has following equipment at home: None  OCCUPATION: Futures trader  PLOF: Independent  PATIENT GOALS: Increase LE flexibility/ muscle strength to improve pain-free walking/ mobility.    NEXT MD VISIT: PRN  OBJECTIVE:  Note: Objective measures were completed at Evaluation unless otherwise noted.  DIAGNOSTIC FINDINGS: EXAM: LEFT KNEE - COMPLETE 4 VIEW   COMPARISON:  None Available.   FINDINGS: No fracture or dislocation. Preserved bone mineralization and joint spaces. No joint effusion. The right knee was included on the frontal view and is also grossly preserved. Dedicated right knee films could be performed as clinically appropriate.   IMPRESSION: No acute osseous abnormality.     Electronically Signed   By: Adrianna Horde M.D.   On: 12/05/2023 10:36  PATIENT SURVEYS:  LEFS 36 out of 80.  COGNITION: Overall cognitive status: Within functional limits for tasks assessed     SENSATION: WFL  EDEMA:  NT.  No visible swelling in knee joints.    MUSCLE LENGTH: Hamstrings: Right 68 deg; Left 70 deg Thomas test: NT  POSTURE: rounded shoulders, forward head, and increased thoracic kyphosis.   Discussed posture correction ex./ pec stretches.    PALPATION: Negative in  knee/hip  LOWER EXTREMITY ROM:  Active ROM Right eval Left eval  Hip flexion 124 deg. 124 deg.  Hip extension    Hip abduction    Hip adduction    Hip internal rotation 29 deg. 30 deg.  Hip external rotation 34 deg. 32 deg.  Knee flexion [facility]  Knee extension [facility]  Ankle dorsiflexion    Ankle plantarflexion    Ankle inversion    Ankle eversion     (Blank rows = not tested)  LOWER EXTREMITY MMT:  MMT Right eval Left eval  Hip flexion 4+ 4+  Hip extension    Hip abduction 4 4  Hip adduction    Hip internal rotation 4+ 4+  Hip external rotation 4+ 4+  Knee flexion 5 5  Knee extension 5 5  Ankle dorsiflexion    Ankle plantarflexion    Ankle inversion    Ankle eversion     (Blank rows = not tested)  LOWER EXTREMITY SPECIAL TESTS:  Hip special tests: Portia Brittle (FABER) test: positive  Knee special tests: Anterior drawer test: negative, Posterior drawer test: negative, and McMurray's test: negative.   Negative Thomas test.    FUNCTIONAL TESTS:  5 times sit to stand: TBD  GAIT: Distance walked: in clinic Assistive device utilized: None Level of assistance: Complete Independence Comments: Antalgic gait pattern with limited step length/ BOS.  Stiffness in B hips.                                                                              TREATMENT DATE: 01/10/2024  Subjective:  Pt. Reports he is doing better.  No L knee pain reported and pt. States he is aware of posture with everyday tasks.    There.ex.:  Nustep L5 B LE only (used B UE for last several minutes) for 10 min. (0.57 miles).  Discussed HEP/ upright posture/ head position.   Floor to waist lifting (22#) with mirror feedback 10x.  Great upright posture/hip and knee flexion.    TG knee flexion (90 deg.)/ heel raises 20x2 each.  Resisted gait at Nautilus: 95# 6x all 4-planes.  Controlled movement pattern with focus on LE stability/ recip. Step pattern.    Reassessment of core ex. Program/ use of white  theraball.      Manual tx.:  Supine LE stretches (focus on hamstring/ quad/ gastroc/ hip rotn./ piriformis)- 10 min.  Discussed proper technique supine trunk rotn.    Supine L/R LAD with gentle oscillations 3x each.      Not Today: BTB scapular retraction 20x with mirror feedback.  PT provided tactile feedback to scapular/ rhomboid region for proper technique.   BTB shoulder diagonals 20x.  Mirror and Systems developer.    Supine 4# ankle wts.:  marching/ bicycles/ SLR 10x2.     PATIENT EDUCATION:  Education details: See HEP Person educated: Patient Education method: Explanation, Demonstration, and Handouts Education comprehension: verbalized understanding and returned demonstration  HOME EXERCISE PROGRAM: Access Code: [email or web address] URL: [email or web address] Date: 12/13/2023 Prepared by: Hazeline Lister  Exercises - Supine Lower Trunk Rotation  - 1 x daily - 5 x weekly - 1 sets - 10 reps - Supine Figure 4 Piriformis Stretch  - 1 x daily - 7 x weekly - 1 sets - 5 reps - Supine Hamstring Stretch  - 1 x daily - 7 x weekly - 1 sets - 5 reps - Supine Bridge  - 1 x daily - 5 x weekly - 2 sets - 10 reps - Seated Piriformis Stretch  - 1 x daily - 5 x weekly - 1 sets - 5 reps - Seated Figure 4 Piriformis Stretch  - 1 x daily - 5 x weekly - 1 sets - 5 reps - Doorway Pec Stretch at 90 Degrees Abduction  - 1 x daily -  5 x weekly - 1 sets - 5 reps - Standing Scapular Retraction with External Rotation  - 1 x daily - 5 x weekly - 2 sets - 10 reps  ASSESSMENT:  CLINICAL IMPRESSION: Pt. Presents with marked increase in hip ROM and progressing well with resisted there.ex.  Pt. Demonstrates good floor to waist lifting technique and carrying object in gym.  Pt. Ambulates with more consistent recip. Gait pattern with no assistive device but requires cuing for posture correction/ arm swing.  No c/o hip/knee pain during supine LE ex.  Moderate cuing for proper chin tucks with mirror/manual  feedback.  Pt. Will benefit from skilled PT services to develop home LE stretching/ strengthening ex. Program to improve pain-free mobility.     OBJECTIVE IMPAIRMENTS: Abnormal gait, decreased activity tolerance, decreased balance, decreased mobility, difficulty walking, decreased ROM, decreased strength, hypomobility, impaired flexibility, improper body mechanics, postural dysfunction, and pain.   ACTIVITY LIMITATIONS: carrying, lifting, bending, standing, squatting, stairs, and locomotion level  PARTICIPATION LIMITATIONS: community activity  PERSONAL FACTORS: Fitness and Past/current experiences are also affecting patient's functional outcome.   REHAB POTENTIAL: Good  CLINICAL DECISION MAKING: Evolving/moderate complexity  EVALUATION COMPLEXITY: Moderate   GOALS: Goals reviewed with patient? Yes  SHORT TERM GOALS: Target date: 01/03/24 Pt. Will be independent with HEP to increase B hip AROM (IR/ER) 50% to improve pain-free mobility.   Baseline: see aboce Goal status: Goal met  LONG TERM GOALS: Target date: 02/07/24  Pt. Will increase LEFS to >50 out of 80 to improve pain-free mobility.   Baseline:  36 out of 80.  5/8:  60 out of 80 Goal status: Goal met  2.  Pt. Will report no L knee pain with yardwork/ daily household tasks.   Baseline: 2/10 L knee pain at rest. Goal status: Goal met  3.  Pt. Able to ascend/descend 16 stairs at home with recip. Gait pattern and no increase c/o knee/hip pain.   Baseline: step to gait/ pain reported.  Goal status: Partially met  4.  Pt. Able to return to walking >8,000 steps/day with no pain/limitations.   Baseline: <6,000 steps/day.  5/6: increase steps/day reported.   Goal status: Partially met  PLAN:  PT FREQUENCY: 2x/week  PT DURATION: 8 weeks  PLANNED INTERVENTIONS: 97110-Therapeutic exercises, 97530- Therapeutic activity, [id number]- Neuromuscular re-education, 97535- Self Care, [id number]- Manual therapy, [phone]- Gait training, Balance  training, Stair training, Joint mobilization, Cryotherapy, and Moist heat  PLAN FOR NEXT SESSION: Progress HEP  Lendell Quarry, PT, DPT # [phone] 01/10/2024, 8:44 PM

## 2024-01-15 ENCOUNTER — Ambulatory Visit: Admitting: Physical Therapy

## 2024-01-15 ENCOUNTER — Encounter: Payer: Self-pay | Admitting: Physical Therapy

## 2024-01-15 DIAGNOSIS — M25551 Pain in right hip: Secondary | ICD-10-CM

## 2024-01-15 DIAGNOSIS — G8929 Other chronic pain: Secondary | ICD-10-CM

## 2024-01-15 DIAGNOSIS — M25651 Stiffness of right hip, not elsewhere classified: Secondary | ICD-10-CM | POA: Diagnosis not present

## 2024-01-15 DIAGNOSIS — R269 Unspecified abnormalities of gait and mobility: Secondary | ICD-10-CM | POA: Diagnosis not present

## 2024-01-15 DIAGNOSIS — M6281 Muscle weakness (generalized): Secondary | ICD-10-CM | POA: Diagnosis not present

## 2024-01-15 DIAGNOSIS — M25652 Stiffness of left hip, not elsewhere classified: Secondary | ICD-10-CM | POA: Diagnosis not present

## 2024-01-15 DIAGNOSIS — M25562 Pain in left knee: Secondary | ICD-10-CM | POA: Diagnosis not present

## 2024-01-15 NOTE — Therapy (Signed)
 OUTPATIENT PHYSICAL THERAPY LOWER EXTREMITY TREATMENT/ DISCHARGE  Patient Name: Marvin Pacheco MRN: [id number] DOB:1945-11-20, 78 y.o., male Today's Date: 01/15/2024  END OF SESSION:  PT End of Session - 01/15/24 0941     Visit Number 9    Number of Visits 16    Date for PT Re-Evaluation 02/07/24    PT Start Time 0942    PT Stop Time 1035    PT Time Calculation (min) 53 min             Past Medical History:  Diagnosis Date   Aortic aneurysm (HCC)    4 cm- watched by cardiology per pt   Atrial flutter (HCC)    Basal cell carcinoma    on her face   Cataract    Hyperlipidemia    Meniere disease    Pseudophakia    Seasonal allergies    Sleep apnea    Past Surgical History:  Procedure Laterality Date   ABLATION  06/2019   Atrial fib flutter. REPEAT 2012   bilateral inguinal hernia  1990   CATARACT EXTRACTION     COLONOSCOPY     lipoma abdomen  2000   revision hernia     2000 and 2006. Revision of inguinal hernia   TOOTH EXTRACTION     Patient Active Problem List   Diagnosis Date Noted   Pain behind the ear, left 12/11/2023   Scalp pain 12/11/2023   Facial pain 12/11/2023   Left knee pain 12/05/2023   Bilateral calf pain 12/05/2023   Lateral pain of right hip 12/05/2023   Thrombocytopenia (HCC) 09/17/2023   COVID-19 10/09/2022   Essential hypertension 02/17/2021   Medicare annual wellness visit, subsequent 02/17/2021   Tachycardia 12/22/2019   Welcome to Medicare preventive visit 10/21/2018   BPH (benign prostatic hyperplasia) 10/21/2018   Snoring 08/21/2016   Prediabetes 07/08/2015   Aortic aneurysm (HCC) 07/14/2014   History of colonic polyps 09/18/2012   Colon cancer screening 05/22/2012   Meniere disease 01/15/2012   ANA positive 01/15/2012   Adverse effects of medication 01/15/2012   Vertigo 12/15/2011   Atrial flutter (HCC) 12/15/2011   Routine general medical examination at a health care facility 10/03/2011   Prostate cancer screening  10/03/2011   Allergic rhinitis 04/08/2008   HYPERCHOLESTEROLEMIA 01/09/2008    PCP: Clemens Curt, MD  REFERRING PROVIDER: Clemens Curt, MD  REFERRING DIAG:  Diagnosis  [phone] (ICD-10-CM) - Chronic pain of left knee  M79.661,M79.662 (ICD-10-CM) - Bilateral calf pain  M25.551 (ICD-10-CM) - Lateral pain of right hip   THERAPY DIAG:  Chronic pain of left knee  Pain in right hip  Joint stiffness of both hips  Muscle weakness (generalized)  Gait difficulty  Rationale for Evaluation and Treatment: Rehabilitation  ONSET DATE: 07/2023  SUBJECTIVE:   SUBJECTIVE STATEMENT: Pt. Known to PT clinic.  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  Pt. C/o 1-2/10 L knee pain with activity.  Pt. Reports a L knee injury while playing basketball with grandson's during Thanksgiving in 2024.  Pt. Reports a swaying gait, esp. With ascending/ descending stairs.  Pt. 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  Pt. States he was walking 8,000 steps/day and now is walking <6,000 steps/day.    PERTINENT HISTORY: Left knee pain - Primary     Acute after injury in November  That was followed by ruptured baker's cyst  Now continued discomfort and intermittent swelling  Interested  in PT since hamstrings and calves are very tight Gait has changed as well    Reviewed Dr Tasia Farr note from January  Reviewed LE US  from January    Today- xray of knee notes no osseous abn (reassuring)    Exam is reassuring -noted some tenderness in patellar tendon and patellofemoral areas    Ref to PT  Ice  Compression prn    Call back and Er precautions noted in detail today          Relevant Orders    DG Knee 4 Views W/Patella Left (Completed)    Ambulatory referral to Physical Therapy    Lateral pain of right hip    Suspect troch bursitis based on exam Gait has changed due to left knee problem    Ref to PT  Encouraged use of ice for this and knee         Relevant Orders    Ambulatory referral to Physical Therapy    Bilateral calf pain    May be due to gait change from knee issue Also on statin Instructed to hold statin for 1 week just to see if it helps   Ref to PT        Relevant Orders    Ambulatory referral to Physical Therapy   PAIN:  Are you having pain? Yes: NPRS scale: 1-2/10 Pain location: L knee Pain description: aching Aggravating factors: increase activity Relieving factors: rest.   PRECAUTIONS: None  RED FLAGS: None   WEIGHT BEARING RESTRICTIONS: No  FALLS:  Has patient fallen in last 6 months? No  LIVING ENVIRONMENT: Lives with: lives with their spouse Lives in: House/apartment Stairs: Yes: Internal: 16 steps; on right going up Has following equipment at home: None  OCCUPATION: Futures trader  PLOF: Independent  PATIENT GOALS: Increase LE flexibility/ muscle strength to improve pain-free walking/ mobility.    NEXT MD VISIT: PRN  OBJECTIVE:  Note: Objective measures were completed at Evaluation unless otherwise noted.  DIAGNOSTIC FINDINGS: EXAM: LEFT KNEE - COMPLETE 4 VIEW   COMPARISON:  None Available.   FINDINGS: No fracture or dislocation. Preserved bone mineralization and joint spaces. No joint effusion. The right knee was included on the frontal view and is also grossly preserved. Dedicated right knee films could be performed as clinically appropriate.   IMPRESSION: No acute osseous abnormality.     Electronically Signed   By: Adrianna Horde M.D.   On: 12/05/2023 10:36  PATIENT SURVEYS:  LEFS 36 out of 80.  COGNITION: Overall cognitive status: Within functional limits for tasks assessed     SENSATION: WFL  EDEMA:  NT.  No visible swelling in knee joints.    MUSCLE LENGTH: Hamstrings: Right 68 deg; Left 70 deg Thomas test: NT  POSTURE: rounded shoulders, forward head, and increased thoracic kyphosis.   Discussed posture correction ex./ pec stretches.     PALPATION: Negative in knee/hip  LOWER EXTREMITY ROM:  Active ROM Right eval Left eval  Hip flexion 124 deg. 124 deg.  Hip extension    Hip abduction    Hip adduction    Hip internal rotation 29 deg. 30 deg.  Hip external rotation 34 deg. 32 deg.  Knee flexion [facility]  Knee extension [facility]  Ankle dorsiflexion    Ankle plantarflexion    Ankle inversion    Ankle eversion     (Blank rows = not tested)  LOWER EXTREMITY MMT:  MMT Right eval Left eval  Hip flexion 4+ 4+  Hip extension    Hip abduction 4 4  Hip adduction    Hip internal rotation 4+ 4+  Hip external rotation 4+ 4+  Knee flexion 5 5  Knee extension 5 5  Ankle dorsiflexion    Ankle plantarflexion    Ankle inversion    Ankle eversion     (Blank rows = not tested)  LOWER EXTREMITY SPECIAL TESTS:  Hip special tests: Portia Brittle (FABER) test: positive  Knee special tests: Anterior drawer test: negative, Posterior drawer test: negative, and McMurray's test: negative.   Negative Thomas test.    FUNCTIONAL TESTS:  5 times sit to stand: TBD  GAIT: Distance walked: in clinic Assistive device utilized: None Level of assistance: Complete Independence Comments: Antalgic gait pattern with limited step length/ BOS.  Stiffness in B hips.                                                                              TREATMENT DATE: 01/15/2024  Subjective:  Pt. Reports he is doing better.  No L knee pain reported and pt. states he is aware of posture with everyday tasks.  Pts. Wife present for tx. Session today.     There.ex.:  Nustep L5 B LE only for 10 min. (0.62 miles).  Discussed HEP/ upright posture/ head position.   TG knee flexion (90 deg.)/ heel raises 20x2 each.  Resisted gait at [place]: 95# 6x all 4-planes.  Controlled movement pattern with focus on LE stability/ recip. Step pattern.    Seated B LE MMT: 5/5 grossly.  No pain.    Reassessment of core ex. Program.  Manual tx.:  Supine LE stretches  (focus on hamstring/ quad/ gastroc/ hip rotn./ piriformis)- 10 min.  Discussed proper technique supine trunk rotn.    Reassessment of goals.     PATIENT EDUCATION:  Education details: See HEP Person educated: Patient Education method: Explanation, Demonstration, and Handouts Education comprehension: verbalized understanding and returned demonstration  HOME EXERCISE PROGRAM: Access Code: [email or web address] URL: [email or web address] Date: 12/13/2023 Prepared by: Hazeline Lister  Exercises - Supine Lower Trunk Rotation  - 1 x daily - 5 x weekly - 1 sets - 10 reps - Supine Figure 4 Piriformis Stretch  - 1 x daily - 7 x weekly - 1 sets - 5 reps - Supine Hamstring Stretch  - 1 x daily - 7 x weekly - 1 sets - 5 reps - Supine Bridge  - 1 x daily - 5 x weekly - 2 sets - 10 reps - Seated Piriformis Stretch  - 1 x daily - 5 x weekly - 1 sets - 5 reps - Seated Figure 4 Piriformis Stretch  - 1 x daily - 5 x weekly - 1 sets - 5 reps - Doorway Pec Stretch at 90 Degrees Abduction  - 1 x daily - 5 x weekly - 1 sets - 5 reps - Standing Scapular Retraction with External Rotation  - 1 x daily - 5 x weekly - 2 sets - 10 reps  ASSESSMENT:  CLINICAL IMPRESSION: Pt. Presents with marked increase in hip ROM and progressing well with resisted there.ex.  Pt. Ambulates with more consistent recip. Gait pattern with no cuing for posture correction/ arm  swing.  No c/o hip/knee pain during supine LE ex.  All PT goals met.  Discharge from PT at this time with focus on an independent ex. Program.  Pt. Instructed to contact PT if any regression in symptoms or questions.    OBJECTIVE IMPAIRMENTS: Abnormal gait, decreased activity tolerance, decreased balance, decreased mobility, difficulty walking, decreased ROM, decreased strength, hypomobility, impaired flexibility, improper body mechanics, postural dysfunction, and pain.   ACTIVITY LIMITATIONS: carrying, lifting, bending, standing, squatting, stairs, and  locomotion level  PARTICIPATION LIMITATIONS: community activity  PERSONAL FACTORS: Fitness and Past/current experiences are also affecting patient's functional outcome.   REHAB POTENTIAL: Good  CLINICAL DECISION MAKING: Evolving/moderate complexity  EVALUATION COMPLEXITY: Moderate   GOALS: Goals reviewed with patient? Yes  SHORT TERM GOALS: Target date: 01/03/24 Pt. Will be independent with HEP to increase B hip AROM (IR/ER) 50% to improve pain-free mobility.   Baseline: see aboce Goal status: Goal met  LONG TERM GOALS: Target date: 02/07/24  Pt. Will increase LEFS to >50 out of 80 to improve pain-free mobility.   Baseline:  36 out of 80.  5/8:  60 out of 80 Goal status: Goal met  2.  Pt. Will report no L knee pain with yardwork/ daily household tasks.   Baseline: 2/10 L knee pain at rest. Goal status: Goal met  3.  Pt. Able to ascend/descend 16 stairs at home with recip. Gait pattern and no increase c/o knee/hip pain.   Baseline: step to gait/ pain reported.  Goal status: Goal met  4.  Pt. Able to return to walking >8,000 steps/day with no pain/limitations.   Baseline: <6,000 steps/day.  5/6: increase steps/day reported.   Goal status: Goal met  PLAN:  PT FREQUENCY: 2x/week  PT DURATION: 8 weeks  PLANNED INTERVENTIONS: 97110-Therapeutic exercises, 97530- Therapeutic activity, [id number]- Neuromuscular re-education, 97535- Self Care, 09811- Manual therapy, [phone]- Gait training, Balance training, Stair training, Joint mobilization, Cryotherapy, and Moist heat  PLAN FOR NEXT SESSION: Discharge  Lendell Quarry, PT, DPT # [phone] 01/15/2024, 12:36 PM

## 2024-01-17 ENCOUNTER — Encounter: Admitting: Physical Therapy

## 2024-01-18 DIAGNOSIS — Z8679 Personal history of other diseases of the circulatory system: Secondary | ICD-10-CM | POA: Diagnosis not present

## 2024-01-18 DIAGNOSIS — I10 Essential (primary) hypertension: Secondary | ICD-10-CM | POA: Diagnosis not present

## 2024-01-18 DIAGNOSIS — Z87891 Personal history of nicotine dependence: Secondary | ICD-10-CM | POA: Diagnosis not present

## 2024-01-18 DIAGNOSIS — I4819 Other persistent atrial fibrillation: Secondary | ICD-10-CM | POA: Diagnosis not present

## 2024-01-18 DIAGNOSIS — I77810 Thoracic aortic ectasia: Secondary | ICD-10-CM | POA: Diagnosis not present

## 2024-01-18 DIAGNOSIS — E785 Hyperlipidemia, unspecified: Secondary | ICD-10-CM | POA: Diagnosis not present

## 2024-01-22 ENCOUNTER — Ambulatory Visit: Admitting: Physical Therapy

## 2024-01-24 ENCOUNTER — Encounter: Admitting: Physical Therapy

## 2024-01-30 ENCOUNTER — Encounter: Admitting: Physical Therapy

## 2024-02-04 ENCOUNTER — Encounter: Admitting: Physical Therapy

## 2024-02-05 ENCOUNTER — Encounter: Admitting: Physical Therapy

## 2024-02-05 DIAGNOSIS — N402 Nodular prostate without lower urinary tract symptoms: Secondary | ICD-10-CM | POA: Diagnosis not present

## 2024-02-07 ENCOUNTER — Encounter: Admitting: Physical Therapy

## 2024-02-07 ENCOUNTER — Encounter: Payer: Self-pay | Admitting: Family Medicine

## 2024-06-02 DIAGNOSIS — H353130 Nonexudative age-related macular degeneration, bilateral, stage unspecified: Secondary | ICD-10-CM | POA: Diagnosis not present

## 2024-06-02 DIAGNOSIS — H35363 Drusen (degenerative) of macula, bilateral: Secondary | ICD-10-CM | POA: Diagnosis not present

## 2024-06-02 DIAGNOSIS — H8103 Meniere's disease, bilateral: Secondary | ICD-10-CM | POA: Diagnosis not present

## 2024-06-23 DIAGNOSIS — H353132 Nonexudative age-related macular degeneration, bilateral, intermediate dry stage: Secondary | ICD-10-CM | POA: Diagnosis not present

## 2024-06-26 DIAGNOSIS — D692 Other nonthrombocytopenic purpura: Secondary | ICD-10-CM | POA: Diagnosis not present

## 2024-06-26 DIAGNOSIS — L820 Inflamed seborrheic keratosis: Secondary | ICD-10-CM | POA: Diagnosis not present

## 2024-06-26 DIAGNOSIS — L570 Actinic keratosis: Secondary | ICD-10-CM | POA: Diagnosis not present

## 2024-06-26 DIAGNOSIS — L821 Other seborrheic keratosis: Secondary | ICD-10-CM | POA: Diagnosis not present

## 2024-06-26 DIAGNOSIS — L814 Other melanin hyperpigmentation: Secondary | ICD-10-CM | POA: Diagnosis not present

## 2024-06-26 DIAGNOSIS — L578 Other skin changes due to chronic exposure to nonionizing radiation: Secondary | ICD-10-CM | POA: Diagnosis not present

## 2024-06-26 DIAGNOSIS — D229 Melanocytic nevi, unspecified: Secondary | ICD-10-CM | POA: Diagnosis not present

## 2024-07-22 DIAGNOSIS — H40023 Open angle with borderline findings, high risk, bilateral: Secondary | ICD-10-CM | POA: Diagnosis not present

## 2024-07-22 DIAGNOSIS — H353132 Nonexudative age-related macular degeneration, bilateral, intermediate dry stage: Secondary | ICD-10-CM | POA: Diagnosis not present

## 2024-08-07 DIAGNOSIS — H353132 Nonexudative age-related macular degeneration, bilateral, intermediate dry stage: Secondary | ICD-10-CM | POA: Diagnosis not present

## 2024-09-10 ENCOUNTER — Ambulatory Visit

## 2024-09-11 ENCOUNTER — Telehealth: Payer: Self-pay | Admitting: *Deleted

## 2024-09-11 NOTE — Telephone Encounter (Signed)
 Copied from CRM [id number]. Topic: General - Other >> Sep 11, 2024  2:42 PM Geneva B wrote: Reason for CRM:  pt needs an appt for wellness over telephone please call pt [phone]

## 2024-09-19 ENCOUNTER — Ambulatory Visit

## 2024-09-19 VITALS — Ht 70.0 in | Wt 178.0 lb

## 2024-09-19 DIAGNOSIS — Z0000 Encounter for general adult medical examination without abnormal findings: Secondary | ICD-10-CM

## 2024-09-19 NOTE — Progress Notes (Signed)
 "  Chief Complaint  Patient presents with   Medicare Wellness     Subjective:   Marvin Pacheco is a 79 y.o. male who presents for a Medicare Annual Wellness Visit.  Visit info / Clinical Intake: Medicare Wellness Visit Type:: Subsequent Annual Wellness Visit Persons participating in visit and providing information:: patient Medicare Wellness Visit Mode:: Telephone If telephone:: video declined Since this visit was completed virtually, some vitals may be partially provided or unavailable. Missing vitals are due to the limitations of the virtual format.: Unable to obtain vitals - no equipment If Telephone or Video please confirm:: I connected with patient using audio/video enable telemedicine. I verified patient identity with two identifiers, discussed telehealth limitations, and patient agreed to proceed. Patient Location:: home Provider Location:: office Interpreter Needed?: No Pre-visit prep was completed: yes AWV questionnaire completed by patient prior to visit?: no Living arrangements:: lives with spouse/significant other Patient's Overall Health Status Rating: excellent Typical amount of pain: none Does pain affect daily life?: no Are you currently prescribed opioids?: no  Dietary Habits and Nutritional Risks How many meals a day?: 3 Eats fruit and vegetables daily?: yes Most meals are obtained by: preparing own meals In the last 2 weeks, have you had any of the following?: none Diabetic:: no  Functional Status Activities of Daily Living (to include ambulation/medication): Independent Ambulation: Independent Medication Administration: Independent Home Management (perform basic housework or laundry): Independent Manage your own finances?: yes Primary transportation is: driving Concerns about vision?: no *vision screening is required for WTM* Concerns about hearing?: (!) yes Uses hearing aids?: (!) yes Hear whispered voice?: (!) no *in-person visit only*  Fall  Screening Falls in the past year?: 0 Number of falls in past year: 0 Was there an injury with Fall?: 0 Fall Risk Category Calculator: 0 Patient Fall Risk Level: Low Fall Risk  Fall Risk Patient at Risk for Falls Due to: No Fall Risks Fall risk Follow up: Falls evaluation completed; Education provided; Falls prevention discussed  Home and Transportation Safety: All rugs have non-skid backing?: yes All stairs or steps have railings?: yes Grab bars in the bathtub or shower?: yes Have non-skid surface in bathtub or shower?: yes Good home lighting?: yes Regular seat belt use?: yes Hospital stays in the last year:: no  Cognitive Assessment Difficulty concentrating, remembering, or making decisions? : no Will 6CIT or Mini Cog be Completed: yes What year is it?: 0 points What month is it?: 0 points Give patient an address phrase to remember (5 components): [address] [place]  About what time is it?: 0 points Count backwards from 20 to 1: 0 points Say the months of the year in reverse: 0 points Repeat the address phrase from earlier: 0 points 6 CIT Score: 0 points  Advance Directives (For Healthcare) Does Patient Have a Medical Advance Directive?: Yes Does patient want to make changes to medical advance directive?: No - Patient declined Type of Advance Directive: Healthcare Power of [place]; Living will Copy of Healthcare Power of Attorney in Chart?: No - copy requested Copy of Living Will in Chart?: No - copy requested  Reviewed/Updated  Reviewed/Updated: Reviewed All (Medical, Surgical, Family, Medications, Allergies, Care Teams, Patient Goals)    Allergies (verified) Pepcid ac [famotidine]   Current Medications (verified) Outpatient Encounter Medications as of 09/19/2024  Medication Sig   apixaban (ELIQUIS) 5 MG TABS tablet Take 5 mg by mouth 2 (two) times daily.   Ascorbic Acid (VITAMIN C) 1000 MG tablet Take 1,000 mg  by mouth daily.   cholecalciferol  (VITAMIN D) 1000 UNITS tablet Take 4,000 Units by mouth daily.   co-enzyme Q-10 50 MG capsule Take 50 mg by mouth daily.   Multiple Vitamin (MULTIVITAMIN) capsule Take 1 capsule by mouth daily.   Multiple Vitamins-Minerals (ICAPS AREDS 2 PO) Take 1 capsule by mouth 2 (two) times daily.   NONFORMULARY OR COMPOUNDED ITEM Take 1 capsule by mouth daily. BETAHISTINE   pravastatin  (PRAVACHOL ) 80 MG tablet Take 1 tablet (80 mg total) by mouth daily.   verapamil (CALAN-SR) 120 MG CR tablet Take 120 mg by mouth daily.   hydrochlorothiazide (HYDRODIURIL) 25 MG tablet Take by mouth. (Patient not taking: Reported on 09/19/2024)   Potassium Chloride Crys CR (KLOR-CON M20 PO) Take 1 tablet by mouth daily. (Patient not taking: Reported on 09/19/2024)   predniSONE  (DELTASONE ) 10 MG tablet Take 3 pills once daily by mouth for 3 days, then 2 pills once daily for 3 days, then 1 pill once daily for 3 days and then stop (Patient not taking: Reported on 09/19/2024)   No facility-administered encounter medications on file as of 09/19/2024.    History: Past Medical History:  Diagnosis Date   Aortic aneurysm    4 cm- watched by cardiology per pt   Atrial flutter (HCC)    Basal cell carcinoma    on her face   Cataract    Hyperlipidemia    Meniere disease    Pseudophakia    Seasonal allergies    Sleep apnea    Past Surgical History:  Procedure Laterality Date   ABLATION  06/2019   Atrial fib flutter. REPEAT 2012   bilateral inguinal hernia  1990   CATARACT EXTRACTION     COLONOSCOPY     EYE SURGERY     HERNIA REPAIR  1992   Repaired   lipoma abdomen  2000   revision hernia     2000 and 2006. Revision of inguinal hernia   TOOTH EXTRACTION     Family History  Problem Relation Age of Onset   Abnormal newborn screen Sister    Early death Mother    Heart disease Sister    Colon cancer Neg Hx    Stomach cancer Neg Hx    Social History   Occupational History   Not on file  Tobacco Use   Smoking  status: Never   Smokeless tobacco: Never  Vaping Use   Vaping status: Never Used  Substance and Sexual Activity   Alcohol use: No    Alcohol/week: 0.0 standard drinks of alcohol   Drug use: No   Sexual activity: Not on file   Tobacco Counseling Counseling given: Not Answered  SDOH Screenings   Food Insecurity: No Food Insecurity (09/19/2024)  Housing: Low Risk (09/19/2024)  Transportation Needs: No Transportation Needs (09/19/2024)  Utilities: Not At Risk (09/19/2024)  Alcohol Screen: Low Risk (09/19/2024)  Depression (PHQ2-9): Low Risk (09/19/2024)  Financial Resource Strain: Low Risk (09/19/2024)  Physical Activity: Sufficiently Active (09/19/2024)  Social Connections: Socially Integrated (09/19/2024)  Stress: No Stress Concern Present (09/19/2024)  Tobacco Use: Low Risk (09/19/2024)  Recent Concern: Tobacco Use - Medium Risk (07/22/2024)   Received from [facility]  Health Literacy: Adequate Health Literacy (09/19/2024)   See flowsheets for full screening details  Depression Screen PHQ 2 & 9 Depression Scale- Over the past 2 weeks, how often have you been bothered by any of the following problems? Little interest or pleasure in doing things: 0 Feeling  down, depressed, or hopeless (PHQ Adolescent also includes...irritable): 0 PHQ-2 Total Score: 0 Trouble falling or staying asleep, or sleeping too much: 0 Feeling tired or having little energy: 0 Poor appetite or overeating (PHQ Adolescent also includes...weight loss): 0 Feeling bad about yourself - or that you are a failure or have let yourself or your family down: 0 Trouble concentrating on things, such as reading the newspaper or watching television (PHQ Adolescent also includes...like school work): 0 Moving or speaking so slowly that other people could have noticed. Or the opposite - being so fidgety or restless that you have been moving around a lot more than usual: 0 Thoughts that you would be better off dead,  or of hurting yourself in some way: 0 PHQ-9 Total Score: 0 If you checked off any problems, how difficult have these problems made it for you to do your work, take care of things at home, or get along with other people?: Not difficult at all     Goals Addressed             This Visit's Progress    I would like to continue my stretches and activities       COMPLETED: Increase physical activity       COMPLETED: Patient Stated       Starting 10/14/2018, I will continue to exercise for 45 minutes 3-4 days per week.      Patient Stated   On track    12/15/2019, I will continue to walk daily for 30-45 minutes             Objective:    Today's Vitals   09/19/24 1451  Weight: 178 lb (80.7 kg)  Height: 5' 10 (1.778 m)   Body mass index is 25.54 kg/m.  Hearing/Vision screen No results found. Immunizations and Health Maintenance Health Maintenance  Topic Date Due   Medicare Annual Wellness (AWV)  09/02/2023   COVID-19 Vaccine (3 - Moderna risk series) 10/02/2024 (Originally 11/27/2019)   Zoster Vaccines- Shingrix (1 of 2) 03/11/2026 (Originally 03/27/1965)   Colonoscopy  01/06/2027   DTaP/Tdap/Td (4 - Td or Tdap) 05/10/2034   Pneumococcal Vaccine: 50+ Years  Completed   Influenza Vaccine  Completed   Hepatitis C Screening  Completed   Meningococcal B Vaccine  Aged Out        Assessment/Plan:  This is a routine wellness examination for Marvin Pacheco.  Patient Care Team: Tower, Laine LABOR, MD as PCP - General Dr Willma Pouch as Consulting Physician (Ophthalmology)  I have personally reviewed and noted the following in the patients chart:   Medical and social history Use of alcohol, tobacco or illicit drugs  Current medications and supplements including opioid prescriptions. Functional ability and status Nutritional status Physical activity Advanced directives List of other physicians Hospitalizations, surgeries, and ER visits in previous 12 months Vitals Screenings to  include cognitive, depression, and falls Referrals and appointments  No orders of the defined types were placed in this encounter.  In addition, I have reviewed and discussed with patient certain preventive protocols, quality metrics, and best practice recommendations. A written personalized care plan for preventive services as well as general preventive health recommendations were provided to patient.   Marvin LITTIE Saris, LPN   8/83/7973    After Visit Summary: (MyChart) Due to this being a telephonic visit, the after visit summary with patients personalized plan was offered to patient via MyChart   Nurse Notes: No voiced or noted concerns at this time Patient  advised to keep follow-up appointment with PCP (Feb 2026) Appointment(s) made: (AWV/CPE Feb 2027)  "

## 2024-09-19 NOTE — Patient Instructions (Signed)
 [name],  Thank you for taking the time for your Medicare Wellness Visit. I appreciate your continued commitment to your health goals. Please review the care plan we discussed, and feel free to reach out if I can assist you further.  Please note that Annual Wellness Visits do not include a physical exam. Some assessments may be limited, especially if the visit was conducted virtually. If needed, we may recommend an in-person follow-up with your provider.  Ongoing Care Seeing your primary care provider every 3 to 6 months helps us  monitor your health and provide consistent, personalized care.   Referrals If a referral was made during today's visit and you haven't received any updates within two weeks, please contact the referred provider directly to check on the status.  Recommended Screenings:  Health Maintenance  Topic Date Due   Medicare Annual Wellness Visit  09/02/2023   COVID-19 Vaccine (3 - Moderna risk series) 10/02/2024*   Zoster (Shingles) Vaccine (1 of 2) 03/11/2026*   Colon Cancer Screening  01/06/2027   DTaP/Tdap/Td vaccine (4 - Td or Tdap) 05/10/2034   Pneumococcal Vaccine for age over 83  Completed   Flu Shot  Completed   Hepatitis C Screening  Completed   Meningitis B Vaccine  Aged Out  *Topic was postponed. The date shown is not the original due date.       09/01/2022    1:35 PM  Advanced Directives  Does Patient Have a Medical Advance Directive? Yes  Type of Estate Agent of [place];Living will  Does patient want to make changes to medical advance directive? No - Patient declined  Copy of Healthcare Power of Attorney in Chart? No - copy requested    Vision: Annual vision screenings are recommended for early detection of glaucoma, cataracts, and diabetic retinopathy. These exams can also reveal signs of chronic conditions such as diabetes and high blood pressure.  Dental: Annual dental screenings help detect early signs of oral cancer,  gum disease, and other conditions linked to overall health, including heart disease and diabetes.  Please see the attached documents for additional preventive care recommendations.

## 2024-09-25 ENCOUNTER — Encounter: Admitting: Family Medicine

## 2024-09-28 ENCOUNTER — Telehealth: Payer: Self-pay | Admitting: Family Medicine

## 2024-09-28 DIAGNOSIS — I10 Essential (primary) hypertension: Secondary | ICD-10-CM

## 2024-09-28 DIAGNOSIS — N401 Enlarged prostate with lower urinary tract symptoms: Secondary | ICD-10-CM

## 2024-09-28 DIAGNOSIS — R7303 Prediabetes: Secondary | ICD-10-CM

## 2024-09-28 DIAGNOSIS — D696 Thrombocytopenia, unspecified: Secondary | ICD-10-CM

## 2024-09-28 DIAGNOSIS — E7800 Pure hypercholesterolemia, unspecified: Secondary | ICD-10-CM

## 2024-09-28 DIAGNOSIS — Z125 Encounter for screening for malignant neoplasm of prostate: Secondary | ICD-10-CM

## 2024-09-28 NOTE — Telephone Encounter (Signed)
-----   Message from Veva JINNY Ferrari sent at 09/22/2024  4:04 PM EST ----- Regarding: Lab orders for Tue, 1.27.26 Patient is scheduled for CPX labs, please order future labs, Thanks , Veva

## 2024-09-30 ENCOUNTER — Other Ambulatory Visit

## 2024-10-01 ENCOUNTER — Other Ambulatory Visit (INDEPENDENT_AMBULATORY_CARE_PROVIDER_SITE_OTHER)

## 2024-10-01 DIAGNOSIS — Z125 Encounter for screening for malignant neoplasm of prostate: Secondary | ICD-10-CM

## 2024-10-01 DIAGNOSIS — I10 Essential (primary) hypertension: Secondary | ICD-10-CM

## 2024-10-01 DIAGNOSIS — E7800 Pure hypercholesterolemia, unspecified: Secondary | ICD-10-CM | POA: Diagnosis not present

## 2024-10-01 DIAGNOSIS — D696 Thrombocytopenia, unspecified: Secondary | ICD-10-CM

## 2024-10-01 DIAGNOSIS — N401 Enlarged prostate with lower urinary tract symptoms: Secondary | ICD-10-CM

## 2024-10-01 DIAGNOSIS — R350 Frequency of micturition: Secondary | ICD-10-CM | POA: Diagnosis not present

## 2024-10-01 DIAGNOSIS — R7303 Prediabetes: Secondary | ICD-10-CM

## 2024-10-01 LAB — CBC WITH DIFFERENTIAL/PLATELET
Basophils Absolute: 0 10*3/uL (ref 0.0–0.1)
Basophils Relative: 0.6 % (ref 0.0–3.0)
Eosinophils Absolute: 0.2 10*3/uL (ref 0.0–0.7)
Eosinophils Relative: 3.2 % (ref 0.0–5.0)
HCT: 44.8 % (ref 39.0–52.0)
Hemoglobin: 15 g/dL (ref 13.0–17.0)
Lymphocytes Relative: 22.3 % (ref 12.0–46.0)
Lymphs Abs: 1.2 10*3/uL (ref 0.7–4.0)
MCHC: 33.5 g/dL (ref 30.0–36.0)
MCV: 92.9 fl (ref 78.0–100.0)
Monocytes Absolute: 0.5 10*3/uL (ref 0.1–1.0)
Monocytes Relative: 9 % (ref 3.0–12.0)
Neutro Abs: 3.6 10*3/uL (ref 1.4–7.7)
Neutrophils Relative %: 64.9 % (ref 43.0–77.0)
Platelets: 324 10*3/uL (ref 150.0–400.0)
RBC: 4.82 Mil/uL (ref 4.22–5.81)
RDW: 13 % (ref 11.5–15.5)
WBC: 5.5 10*3/uL (ref 4.0–10.5)

## 2024-10-01 LAB — COMPREHENSIVE METABOLIC PANEL WITH GFR
ALT: 22 U/L (ref 3–53)
AST: 25 U/L (ref 5–37)
Albumin: 4.5 g/dL (ref 3.5–5.2)
Alkaline Phosphatase: 52 U/L (ref 39–117)
BUN: 14 mg/dL (ref 6–23)
CO2: 31 meq/L (ref 19–32)
Calcium: 9.1 mg/dL (ref 8.4–10.5)
Chloride: 102 meq/L (ref 96–112)
Creatinine, Ser: 0.65 mg/dL (ref 0.40–1.50)
GFR: 91.52 mL/min
Glucose, Bld: 96 mg/dL (ref 70–99)
Potassium: 4.1 meq/L (ref 3.5–5.1)
Sodium: 138 meq/L (ref 135–145)
Total Bilirubin: 0.8 mg/dL (ref 0.2–1.2)
Total Protein: 6.7 g/dL (ref 6.0–8.3)

## 2024-10-01 LAB — TSH: TSH: 1.38 u[IU]/mL (ref 0.35–5.50)

## 2024-10-01 LAB — LIPID PANEL
Cholesterol: 185 mg/dL (ref 28–200)
HDL: 68.7 mg/dL
LDL Cholesterol: 96 mg/dL (ref 10–99)
NonHDL: 116.49
Total CHOL/HDL Ratio: 3
Triglycerides: 101 mg/dL (ref 10.0–149.0)
VLDL: 20.2 mg/dL (ref 0.0–40.0)

## 2024-10-01 LAB — HEMOGLOBIN A1C: Hgb A1c MFr Bld: 5.7 % (ref 4.6–6.5)

## 2024-10-02 ENCOUNTER — Ambulatory Visit: Payer: Self-pay | Admitting: Family Medicine

## 2024-10-02 LAB — PSA, MEDICARE: PSA: 3.31 ng/mL (ref 0.10–4.00)

## 2024-10-07 ENCOUNTER — Encounter: Admitting: Family Medicine

## 2024-10-07 ENCOUNTER — Encounter: Payer: Self-pay | Admitting: Family Medicine

## 2024-10-07 ENCOUNTER — Ambulatory Visit: Admitting: Family Medicine

## 2024-10-07 VITALS — BP 118/76 | HR 69 | Temp 97.9°F | Ht 69.5 in | Wt 181.5 lb

## 2024-10-07 DIAGNOSIS — I4892 Unspecified atrial flutter: Secondary | ICD-10-CM

## 2024-10-07 DIAGNOSIS — Z0000 Encounter for general adult medical examination without abnormal findings: Secondary | ICD-10-CM | POA: Diagnosis not present

## 2024-10-07 DIAGNOSIS — D696 Thrombocytopenia, unspecified: Secondary | ICD-10-CM | POA: Diagnosis not present

## 2024-10-07 DIAGNOSIS — R350 Frequency of micturition: Secondary | ICD-10-CM

## 2024-10-07 DIAGNOSIS — R7303 Prediabetes: Secondary | ICD-10-CM

## 2024-10-07 DIAGNOSIS — N401 Enlarged prostate with lower urinary tract symptoms: Secondary | ICD-10-CM

## 2024-10-07 DIAGNOSIS — I10 Essential (primary) hypertension: Secondary | ICD-10-CM | POA: Diagnosis not present

## 2024-10-07 DIAGNOSIS — E7800 Pure hypercholesterolemia, unspecified: Secondary | ICD-10-CM

## 2024-10-07 DIAGNOSIS — H8109 Meniere's disease, unspecified ear: Secondary | ICD-10-CM | POA: Diagnosis not present

## 2024-10-07 DIAGNOSIS — Z860100 Personal history of colon polyps, unspecified: Secondary | ICD-10-CM

## 2024-10-07 DIAGNOSIS — I719 Aortic aneurysm of unspecified site, without rupture: Secondary | ICD-10-CM | POA: Diagnosis not present

## 2024-10-07 MED ORDER — PRAVASTATIN SODIUM 80 MG PO TABS: 80.0000 mg | ORAL_TABLET | Freq: Every day | ORAL | 3 refills | Status: AC

## 2024-10-07 NOTE — Assessment & Plan Note (Signed)
 This is checked every spring by cardiology  No clinical changes

## 2024-10-07 NOTE — Assessment & Plan Note (Signed)
 Disc goals for lipids and reasons to control them Rev last labs with pt Rev low sat fat diet in detail LDL 96 Under care of cardiology On pravastatin  80 mg daily

## 2024-10-07 NOTE — Assessment & Plan Note (Signed)
 Normal plt today Lab Results  Component Value Date   WBC 5.5 10/01/2024   HGB 15.0 10/01/2024   HCT 44.8 10/01/2024   MCV 92.9 10/01/2024   PLT 324.0 10/01/2024

## 2024-10-07 NOTE — Patient Instructions (Addendum)
 If you are interested in the new shingles vaccine (Shingrix) - call your local pharmacy to check on coverage and availability    Keep up the great exercise   Labs look stable   To prevent diabetes Avoid added sugars in your diet when you can  Try to get most of your carbohydrates from produce (with the exception of white potatoes) and whole grains Eat less bread/pasta/rice/snack foods/cereals/sweets and other items from the middle of the grocery store (processed carbs)

## 2025-10-02 ENCOUNTER — Other Ambulatory Visit

## 2025-10-09 ENCOUNTER — Encounter: Admitting: Family Medicine

## 2025-10-09 ENCOUNTER — Ambulatory Visit
